# Patient Record
Sex: Male | Born: 1960
Health system: Southern US, Community
[De-identification: ages and names within clinical notes are randomized; demographics above are authoritative.]

## PROBLEM LIST (undated history)

## (undated) DIAGNOSIS — J449 Chronic obstructive pulmonary disease, unspecified: Secondary | ICD-10-CM

## (undated) DIAGNOSIS — E785 Hyperlipidemia, unspecified: Secondary | ICD-10-CM

## (undated) DIAGNOSIS — G629 Polyneuropathy, unspecified: Secondary | ICD-10-CM

## (undated) DIAGNOSIS — I10 Essential (primary) hypertension: Secondary | ICD-10-CM

## (undated) HISTORY — PX: TONSILLECTOMY: SUR1361

## (undated) HISTORY — PX: BACK SURGERY: SHX140

## (undated) HISTORY — DX: Chronic obstructive pulmonary disease, unspecified: J44.9

## (undated) HISTORY — DX: Essential (primary) hypertension: I10

## (undated) HISTORY — DX: Polyneuropathy, unspecified: G62.9

## (undated) HISTORY — PX: HERNIA REPAIR: SHX51

## (undated) HISTORY — DX: Hyperlipidemia, unspecified: E78.5

## (undated) HISTORY — PX: NECK SURGERY: SHX720

---

## 2012-11-26 ENCOUNTER — Encounter: Payer: Self-pay | Admitting: Nurse Practitioner

## 2012-11-26 ENCOUNTER — Ambulatory Visit (INDEPENDENT_AMBULATORY_CARE_PROVIDER_SITE_OTHER): Payer: Medicare Other | Admitting: Nurse Practitioner

## 2012-11-26 VITALS — BP 137/90 | HR 64 | Temp 97.2°F | Ht 73.0 in | Wt 306.5 lb

## 2012-11-26 DIAGNOSIS — E785 Hyperlipidemia, unspecified: Secondary | ICD-10-CM

## 2012-11-26 DIAGNOSIS — I10 Essential (primary) hypertension: Secondary | ICD-10-CM

## 2012-11-26 DIAGNOSIS — J449 Chronic obstructive pulmonary disease, unspecified: Secondary | ICD-10-CM | POA: Insufficient documentation

## 2012-11-26 DIAGNOSIS — J441 Chronic obstructive pulmonary disease with (acute) exacerbation: Secondary | ICD-10-CM

## 2012-11-26 DIAGNOSIS — E1169 Type 2 diabetes mellitus with other specified complication: Secondary | ICD-10-CM | POA: Insufficient documentation

## 2012-11-26 DIAGNOSIS — E782 Mixed hyperlipidemia: Secondary | ICD-10-CM | POA: Insufficient documentation

## 2012-11-26 HISTORY — DX: Hyperlipidemia, unspecified: E78.5

## 2012-11-26 LAB — COMPLETE METABOLIC PANEL WITH GFR
ALT: 28 U/L (ref 0–53)
AST: 22 U/L (ref 0–37)
CO2: 26 mEq/L (ref 19–32)
Calcium: 9.5 mg/dL (ref 8.4–10.5)
Chloride: 104 mEq/L (ref 96–112)
GFR, Est African American: 85 mL/min
Sodium: 142 mEq/L (ref 135–145)
Total Bilirubin: 0.4 mg/dL (ref 0.3–1.2)
Total Protein: 6.5 g/dL (ref 6.0–8.3)

## 2012-11-26 MED ORDER — ACLIDINIUM BROMIDE 400 MCG/ACT IN AEPB: 1.0000 | INHALATION_SPRAY | Freq: Every day | RESPIRATORY_TRACT | Status: DC

## 2012-11-26 MED ORDER — BUDESONIDE-FORMOTEROL FUMARATE 160-4.5 MCG/ACT IN AERO: 2.0000 | INHALATION_SPRAY | Freq: Two times a day (BID) | RESPIRATORY_TRACT | Status: DC

## 2012-11-26 NOTE — Patient Instructions (Signed)

## 2012-11-26 NOTE — Progress Notes (Signed)
  Subjective:    Patient ID: Kurt Blevins, male    DOB: 08-11-1960, 52 y.o.   MRN: [id number]  Hypertension This is a chronic problem. The current episode started more than 1 year ago. The problem is unchanged. The problem is controlled. Associated symptoms include shortness of breath. Pertinent negatives include no blurred vision, chest pain, headaches, neck pain, palpitations or peripheral edema. There are no associated agents to hypertension. Risk factors for coronary artery disease include dyslipidemia, male gender, obesity and sedentary lifestyle. Past treatments include ACE inhibitors and diuretics. The current treatment provides moderate improvement. Compliance problems include diet and exercise.   Hyperlipidemia This is a chronic problem. The current episode started more than 1 year ago. The problem is controlled. Recent lipid tests were reviewed and are normal. Exacerbating diseases include obesity. There are no known factors aggravating his hyperlipidemia. Associated symptoms include shortness of breath. Pertinent negatives include no chest pain, focal sensory loss, focal weakness, leg pain or myalgias. Current antihyperlipidemic treatment includes statins and fibric acid derivatives. The current treatment provides moderate improvement of lipids. Compliance problems include adherence to diet and adherence to exercise.  Risk factors for coronary artery disease include hypertension, male sex and obesity.  COPD Patient doing [place] and albuterol neb- Patient has not been using spirivia or symbicort as RX. Patient says that albuterol helps the most.    Review of Systems  HENT: Negative for neck pain.   Eyes: Negative for blurred vision.  Respiratory: Positive for cough, shortness of breath and wheezing.   Cardiovascular: Negative for chest pain and palpitations.  Musculoskeletal: Negative for myalgias.  Neurological: Negative for focal weakness and headaches.       Objective:   Physical Exam  Constitutional: He appears well-developed and well-nourished.  HENT:  Head: Normocephalic.  Right Ear: External ear normal.  Left Ear: External ear normal.  Nose: Nose normal.  Mouth/Throat: Oropharynx is clear and moist.  Eyes: Conjunctivae and EOM are normal. Pupils are equal, round, and reactive to light.  Neck: Normal range of motion. Neck supple.  Lymphadenopathy:    He has no cervical adenopathy.    BP 137/90  Pulse 64  Temp(Src) 97.2 F (36.2 C) (Oral)  Ht 6\' 1"  (1.854 m)  Wt 306 lb 8 oz (139.027 kg)  BMI 40.45 kg/m2       Assessment & Plan:  1. Hypertension Low Na+ diet Continue  - COMPLETE METABOLIC PANEL WITH GFR  2. Hyperlipidemia Low fat diet and exercise Continue lipitor and fenofibrate - NMR Lipoprofile with Lipids  3. COPD exacerbation Use inhalers as RX Avoid allergens - budesonide-formoterol (SYMBICORT) 160-4.5 MCG/ACT inhaler; Inhale 2 puffs into the lungs 2 (two) times daily.  Dispense: 2 Inhaler; Refill: 0 - Aclidinium Bromide 400 MCG/ACT AEPB; Inhale 1 Inhaler into the lungs daily.  Dispense: 2 each; Refill: 0  Mary-Margaret Daphine Deutscher, FNP

## 2012-11-28 LAB — NMR LIPOPROFILE WITH LIPIDS
Cholesterol, Total: 153 mg/dL (ref <200)
HDL Particle Number: 33.4 umol/L (ref >=30.5)
HDL-C: 37 mg/dL — ABNORMAL LOW (ref >=40)
LDL Size: 20 nm — ABNORMAL LOW (ref >20.5)
LP-IR Score: 85 — ABNORMAL HIGH (ref <=45)
Large HDL-P: <1.3 umol/L — ABNORMAL LOW (ref >=4.8)

## 2013-02-13 ENCOUNTER — Other Ambulatory Visit: Payer: Self-pay | Admitting: *Deleted

## 2013-02-13 MED ORDER — FENOFIBRATE 145 MG PO TABS: 145.0000 mg | ORAL_TABLET | Freq: Every day | ORAL | Status: DC

## 2013-03-04 ENCOUNTER — Telehealth: Payer: Self-pay | Admitting: Nurse Practitioner

## 2013-03-04 NOTE — Telephone Encounter (Signed)
appt made

## 2013-03-05 ENCOUNTER — Ambulatory Visit (INDEPENDENT_AMBULATORY_CARE_PROVIDER_SITE_OTHER): Payer: Medicare Other | Admitting: Nurse Practitioner

## 2013-03-05 ENCOUNTER — Encounter: Payer: Self-pay | Admitting: Nurse Practitioner

## 2013-03-05 VITALS — BP 141/83 | HR 72 | Temp 97.5°F | Ht 73.0 in | Wt 307.0 lb

## 2013-03-05 DIAGNOSIS — L03119 Cellulitis of unspecified part of limb: Secondary | ICD-10-CM

## 2013-03-05 DIAGNOSIS — H9190 Unspecified hearing loss, unspecified ear: Secondary | ICD-10-CM

## 2013-03-05 DIAGNOSIS — IMO0002 Reserved for concepts with insufficient information to code with codable children: Secondary | ICD-10-CM

## 2013-03-05 DIAGNOSIS — H6690 Otitis media, unspecified, unspecified ear: Secondary | ICD-10-CM

## 2013-03-05 MED ORDER — CIPROFLOXACIN HCL 500 MG PO TABS: 500.0000 mg | ORAL_TABLET | Freq: Two times a day (BID) | ORAL | Status: DC

## 2013-03-05 MED ORDER — CEFTRIAXONE SODIUM 1 G IJ SOLR
1.0000 g | INTRAMUSCULAR | Status: AC
  Administered 2013-03-05: 1 g via INTRAMUSCULAR

## 2013-03-05 NOTE — Progress Notes (Signed)
  Subjective:    Patient ID: Kurt Blevins, male    DOB: [birth year], 52 y.o.   MRN: [id number]  HPI 1. Patient woke up Tuesday morning with right elbow swollen and sore to touch. Denies any injury.  2. Also C/o bil ears stopped up- has been using debrox 1-2 x per week- Hearing a little better.    Review of Systems  All other systems reviewed and are negative.       Objective:   Physical Exam  Constitutional: He appears well-developed and well-nourished.  HENT:  Right Ear: Tympanic membrane is erythematous. A middle ear effusion is present.  Left Ear: Tympanic membrane is erythematous. A middle ear effusion is present.  Nose: Mucosal edema present. Right sinus exhibits no maxillary sinus tenderness and no frontal sinus tenderness. Left sinus exhibits no maxillary sinus tenderness and no frontal sinus tenderness.  Mouth/Throat: Oropharynx is clear and moist and mucous membranes are normal.  Eyes: EOM are normal. Pupils are equal, round, and reactive to light.  Neck: Normal range of motion. Neck supple.  Cardiovascular: Normal rate and normal heart sounds.   No murmur heard. Pulmonary/Chest: Effort normal and breath sounds normal.  Musculoskeletal:  Right elbow edematous and hot to touch. Erythema extends 5cm upward and 10 cm downward  Lymphadenopathy:    He has no cervical adenopathy.    BP 141/83  Pulse 72  Temp(Src) 97.5 F (36.4 C) (Oral)  Ht 6\' 1"  (1.854 m)  Wt 307 lb (139.254 kg)  BMI 40.51 kg/m2       Assessment & Plan:  1. Cellulitis of elbow Ice if helps Marks made at edge of reddness- if extends past yhose marks tomorrow RTO for another shot - cefTRIAXone (ROCEPHIN) injection 1 g; Inject 1 g into the muscle now. - ciprofloxacin (CIPRO) 500 MG tablet; Take 1 tablet (500 mg total) by mouth 2 (two) times daily.  Dispense: 20 tablet; Refill: 0  2. OM (otitis media), bilateral  - ciprofloxacin (CIPRO) 500 MG tablet; Take 1 tablet (500 mg total) by mouth 2 (two)  times daily.  Dispense: 20 tablet; Refill: 0  3. Hearing loss, unspecified laterality Referral for hearing test  Mary-Margaret Daphine Deutscher, FNP

## 2013-03-05 NOTE — Patient Instructions (Signed)
Cellulitis Cellulitis is an infection of the skin and the tissue beneath it. The infected area is usually red and tender. Cellulitis occurs most often in the arms and lower legs.  CAUSES  Cellulitis is caused by bacteria that enter the skin through cracks or cuts in the skin. The most common types of bacteria that cause cellulitis are Staphylococcus and Streptococcus. SYMPTOMS   Redness and warmth.  Swelling.  Tenderness or pain.  Fever. DIAGNOSIS  Your caregiver can usually determine what is wrong based on a physical exam. Blood tests may also be done. TREATMENT  Treatment usually involves taking an antibiotic medicine. HOME CARE INSTRUCTIONS   Take your antibiotics as directed. Finish them even if you start to feel better.  Keep the infected arm or leg elevated to reduce swelling.  Apply a warm cloth to the affected area up to 4 times per day to relieve pain.  Only take over-the-counter or prescription medicines for pain, discomfort, or fever as directed by your caregiver.  Keep all follow-up appointments as directed by your caregiver. SEEK MEDICAL CARE IF:   You notice red streaks coming from the infected area.  Your red area gets larger or turns dark in color.  Your bone or joint underneath the infected area becomes painful after the skin has healed.  Your infection returns in the same area or another area.  You notice a swollen bump in the infected area.  You develop new symptoms. SEEK IMMEDIATE MEDICAL CARE IF:   You have a fever.  You feel very sleepy.  You develop vomiting or diarrhea.  You have a general ill feeling (malaise) with muscle aches and pains. MAKE SURE YOU:   Understand these instructions.  Will watch your condition.  Will get help right away if you are not doing well or get worse. Document Released: 04/18/2005 Document Revised: 01/08/2012 Document Reviewed: 09/24/2011 Exit[employer] Patient Information 2014 [employer].  

## 2013-03-30 ENCOUNTER — Ambulatory Visit (INDEPENDENT_AMBULATORY_CARE_PROVIDER_SITE_OTHER): Payer: Medicare Other | Admitting: Nurse Practitioner

## 2013-03-30 ENCOUNTER — Encounter: Payer: Self-pay | Admitting: Nurse Practitioner

## 2013-03-30 VITALS — BP 133/87 | HR 69 | Temp 97.3°F | Ht 73.0 in | Wt 304.0 lb

## 2013-03-30 DIAGNOSIS — L03119 Cellulitis of unspecified part of limb: Secondary | ICD-10-CM

## 2013-03-30 DIAGNOSIS — IMO0002 Reserved for concepts with insufficient information to code with codable children: Secondary | ICD-10-CM

## 2013-03-30 MED ORDER — SULFAMETHOXAZOLE-TMP DS 800-160 MG PO TABS: 1.0000 | ORAL_TABLET | Freq: Two times a day (BID) | ORAL | Status: DC

## 2013-03-30 MED ORDER — CEFTRIAXONE SODIUM 1 G IJ SOLR
1.0000 g | Freq: Once | INTRAMUSCULAR | Status: AC
  Administered 2013-03-30: 1 g via INTRAMUSCULAR

## 2013-03-30 NOTE — Progress Notes (Signed)
  Subjective:    Patient ID: ISSAIH KAUS, male    DOB: June 28, 1961, 52 y.o.   MRN: [id number]  HPI Patient in c/o right elbow pain- red and swollen- was given rocephin and oral antibiotic about 3 weeks ago- got some better but has never completely resolved.    Review of Systems  All other systems reviewed and are negative.       Objective:   Physical Exam  Musculoskeletal:  Right elbow erythematous and hot to touch.   Procedure: Lidocaine 1% with epi 1 cc local Cleaned with betadine #11 blade- Serosanguinous fluid removed from elbow Pressure dressing applied       Assessment & Plan:  1. Cellulitis of elbow Keep clean and dry RTO if not improving Culture pending - cefTRIAXone (ROCEPHIN) injection 1 g; Inject 1 g into the muscle once. - sulfamethoxazole-trimethoprim (BACTRIM DS) 800-160 MG per tablet; Take 1 tablet by mouth 2 (two) times daily.  Dispense: 20 tablet; Refill: 0  Mary-Margaret Daphine Deutscher, FNP

## 2013-03-30 NOTE — Patient Instructions (Addendum)
Cellulitis Cellulitis is an infection of the skin and the tissue beneath it. The infected area is usually red and tender. Cellulitis occurs most often in the arms and lower legs.  CAUSES  Cellulitis is caused by bacteria that enter the skin through cracks or cuts in the skin. The most common types of bacteria that cause cellulitis are Staphylococcus and Streptococcus. SYMPTOMS   Redness and warmth.  Swelling.  Tenderness or pain.  Fever. DIAGNOSIS  Your caregiver can usually determine what is wrong based on a physical exam. Blood tests may also be done. TREATMENT  Treatment usually involves taking an antibiotic medicine. HOME CARE INSTRUCTIONS   Take your antibiotics as directed. Finish them even if you start to feel better.  Keep the infected arm or leg elevated to reduce swelling.  Apply a warm cloth to the affected area up to 4 times per day to relieve pain.  Only take over-the-counter or prescription medicines for pain, discomfort, or fever as directed by your caregiver.  Keep all follow-up appointments as directed by your caregiver. SEEK MEDICAL CARE IF:   You notice red streaks coming from the infected area.  Your red area gets larger or turns dark in color.  Your bone or joint underneath the infected area becomes painful after the skin has healed.  Your infection returns in the same area or another area.  You notice a swollen bump in the infected area.  You develop new symptoms. SEEK IMMEDIATE MEDICAL CARE IF:   You have a fever.  You feel very sleepy.  You develop vomiting or diarrhea.  You have a general ill feeling (malaise) with muscle aches and pains. MAKE SURE YOU:   Understand these instructions.  Will watch your condition.  Will get help right away if you are not doing well or get worse. Document Released: 04/18/2005 Document Revised: 01/08/2012 Document Reviewed: 09/24/2011 [employer] Patient Information 2014 [employer].  

## 2013-04-15 ENCOUNTER — Other Ambulatory Visit: Payer: Self-pay | Admitting: *Deleted

## 2013-04-15 MED ORDER — LISINOPRIL-HYDROCHLOROTHIAZIDE 20-25 MG PO TABS: 1.0000 | ORAL_TABLET | Freq: Every day | ORAL | Status: DC

## 2013-06-10 ENCOUNTER — Ambulatory Visit (INDEPENDENT_AMBULATORY_CARE_PROVIDER_SITE_OTHER): Payer: Medicare Other | Admitting: Nurse Practitioner

## 2013-06-10 VITALS — BP 146/84 | HR 79 | Temp 97.1°F | Ht 73.0 in | Wt 298.0 lb

## 2013-06-10 DIAGNOSIS — J441 Chronic obstructive pulmonary disease with (acute) exacerbation: Secondary | ICD-10-CM

## 2013-06-10 MED ORDER — BUDESONIDE-FORMOTEROL FUMARATE 160-4.5 MCG/ACT IN AERO: 2.0000 | INHALATION_SPRAY | Freq: Two times a day (BID) | RESPIRATORY_TRACT | Status: DC

## 2013-06-10 MED ORDER — LEVALBUTEROL HCL 1.25 MG/0.5ML IN NEBU
1.2500 mg | INHALATION_SOLUTION | Freq: Once | RESPIRATORY_TRACT | Status: AC
  Administered 2013-06-10: 1.25 mg via RESPIRATORY_TRACT

## 2013-06-10 MED ORDER — TIOTROPIUM BROMIDE MONOHYDRATE 18 MCG IN CAPS: 1.0000 | ORAL_CAPSULE | Freq: Every day | RESPIRATORY_TRACT | Status: DC

## 2013-06-10 MED ORDER — ATORVASTATIN CALCIUM 40 MG PO TABS: 40.0000 mg | ORAL_TABLET | Freq: Every day | ORAL | Status: DC

## 2013-06-10 MED ORDER — ACLIDINIUM BROMIDE 400 MCG/ACT IN AEPB: 1.0000 | INHALATION_SPRAY | Freq: Every day | RESPIRATORY_TRACT | Status: DC

## 2013-06-10 MED ORDER — FENOFIBRATE 145 MG PO TABS: 145.0000 mg | ORAL_TABLET | Freq: Every day | ORAL | Status: DC

## 2013-06-10 NOTE — Progress Notes (Signed)
  Subjective:    Patient ID: Kurt Blevins, male    DOB: 1960-07-30, 52 y.o.   MRN: [id number]  Otalgia  There is pain in the right ear. This is a new problem. The current episode started in the past 7 days. The problem occurs constantly. The problem has been unchanged. There has been no fever. The pain is at a severity of 6/10. The pain is moderate. Associated symptoms include coughing and headaches. Pertinent negatives include no neck pain. He has tried nothing for the symptoms.  Hypertension This is a chronic problem. The current episode started more than 1 year ago. The problem is unchanged. The problem is controlled. Associated symptoms include headaches and shortness of breath. Pertinent negatives include no blurred vision, chest pain, neck pain, palpitations or peripheral edema. There are no associated agents to hypertension. Risk factors for coronary artery disease include dyslipidemia, male gender, obesity and sedentary lifestyle. Past treatments include ACE inhibitors and diuretics. The current treatment provides moderate improvement. Compliance problems include diet and exercise.   Hyperlipidemia This is a chronic problem. The current episode started more than 1 year ago. The problem is controlled. Recent lipid tests were reviewed and are normal. Exacerbating diseases include obesity. There are no known factors aggravating his hyperlipidemia. Associated symptoms include shortness of breath. Pertinent negatives include no chest pain, focal sensory loss, focal weakness, leg pain or myalgias. Current antihyperlipidemic treatment includes statins and fibric acid derivatives. The current treatment provides moderate improvement of lipids. Compliance problems include adherence to diet and adherence to exercise.  Risk factors for coronary artery disease include hypertension, male sex and obesity.  COPD Patient doing [place] and albuterol neb-Patient ran out of symbicort and spirivia and is wheezing and  SOB today- can't always afford his meds.   Review of Systems  HENT: Positive for ear pain.   Eyes: Negative for blurred vision.  Respiratory: Positive for cough, shortness of breath and wheezing.   Cardiovascular: Negative for chest pain and palpitations.  Musculoskeletal: Negative for myalgias and neck pain.  Neurological: Positive for headaches. Negative for focal weakness.       Objective:   Physical Exam  Constitutional: He appears well-developed and well-nourished.  HENT:  Head: Normocephalic.  Right Ear: External ear normal.  Left Ear: External ear normal.  Nose: Nose normal.  Mouth/Throat: Oropharynx is clear and moist.  Eyes: Conjunctivae and EOM are normal. Pupils are equal, round, and reactive to light.  Neck: Normal range of motion. Neck supple.  Lymphadenopathy:    He has no cervical adenopathy.    BP 146/84  Pulse 79  Temp(Src) 97.1 F (36.2 C) (Oral)  Ht 6\' 1"  (1.854 m)  Wt 298 lb (135.172 kg)  BMI 39.32 kg/m2       Assessment & Plan:  1. Hypertension Low Na+ diet Continue  - COMPLETE METABOLIC PANEL WITH GFR  2. Hyperlipidemia Low fat diet and exercise Continue lipitor and fenofibrate - NMR Lipoprofile with Lipids  3. COPD exacerbation Use inhalers as RX Avoid allergens - budesonide-formoterol (SYMBICORT) 160-4.5 MCG/ACT inhaler; Inhale 2 puffs into the lungs 2 (two) times daily.  Dispense: 2 Inhaler; Refill: 0 - Aclidinium Bromide 400 MCG/ACT AEPB; Inhale 1 Inhaler into the lungs daily.  Dispense: 2 each; Refill: 0  Mary-Margaret Daphine Deutscher, FNP

## 2013-06-11 ENCOUNTER — Encounter: Payer: Self-pay | Admitting: Nurse Practitioner

## 2013-06-11 ENCOUNTER — Telehealth: Payer: Self-pay | Admitting: Nurse Practitioner

## 2013-06-11 MED ORDER — ALBUTEROL SULFATE HFA 108 (90 BASE) MCG/ACT IN AERS: 2.0000 | INHALATION_SPRAY | Freq: Four times a day (QID) | RESPIRATORY_TRACT | Status: DC | PRN

## 2013-06-11 NOTE — Telephone Encounter (Signed)
Albuterol rx sent to pharmacy 

## 2013-06-12 ENCOUNTER — Other Ambulatory Visit: Payer: Self-pay | Admitting: Nurse Practitioner

## 2013-06-12 LAB — NMR, LIPOPROFILE
Cholesterol: 182 mg/dL (ref <200)
HDL Cholesterol by NMR: 43 mg/dL (ref >=40)
LDL Size: 20.2 nm — ABNORMAL LOW (ref >20.5)
LDLC SERPL CALC-MCNC: 77 mg/dL (ref <100)
Small LDL Particle Number: 1212 nmol/L — ABNORMAL HIGH (ref <=527)
Triglycerides by NMR: 309 mg/dL — ABNORMAL HIGH (ref <150)

## 2013-06-12 LAB — CMP14+EGFR
ALT: 33 IU/L (ref 0–44)
Albumin/Globulin Ratio: 2.4 (ref 1.1–2.5)
Albumin: 4.8 g/dL (ref 3.5–5.5)
BUN: 17 mg/dL (ref 6–24)
CO2: 26 mmol/L (ref 18–29)
GFR calc Af Amer: 80 mL/min/{1.73_m2} (ref >59)
GFR calc non Af Amer: 70 mL/min/{1.73_m2} (ref >59)
Glucose: 94 mg/dL (ref 65–99)
Total Bilirubin: 0.4 mg/dL (ref 0.0–1.2)
Total Protein: 6.8 g/dL (ref 6.0–8.5)

## 2013-06-12 MED ORDER — EZETIMIBE 10 MG PO TABS: 10.0000 mg | ORAL_TABLET | Freq: Every day | ORAL | Status: DC

## 2013-07-06 ENCOUNTER — Telehealth: Payer: Self-pay | Admitting: *Deleted

## 2013-07-06 NOTE — Telephone Encounter (Signed)
notified of labs

## 2013-07-06 NOTE — Telephone Encounter (Signed)
Message copied by Baltazar Apo on Mon Jul 06, 2013 11:57 AM ------      Message from: Magdalene River      Created: Wed Jul 01, 2013  2:47 PM                   ----- Message -----         From: April Yetta Numbers         Sent: 06/24/2013   4:38 PM           To: [facility] Clinical Pool A             ------

## 2013-07-24 ENCOUNTER — Telehealth: Payer: Self-pay | Admitting: Family Medicine

## 2013-07-27 NOTE — Telephone Encounter (Signed)
No return call from patient.

## 2013-09-22 ENCOUNTER — Other Ambulatory Visit: Payer: Self-pay

## 2013-09-22 MED ORDER — EZETIMIBE 10 MG PO TABS: 10.0000 mg | ORAL_TABLET | Freq: Every day | ORAL | Status: DC

## 2013-09-29 ENCOUNTER — Ambulatory Visit (INDEPENDENT_AMBULATORY_CARE_PROVIDER_SITE_OTHER): Payer: Medicare Other

## 2013-09-29 ENCOUNTER — Ambulatory Visit (INDEPENDENT_AMBULATORY_CARE_PROVIDER_SITE_OTHER): Payer: Medicare Other | Admitting: Nurse Practitioner

## 2013-09-29 ENCOUNTER — Encounter (INDEPENDENT_AMBULATORY_CARE_PROVIDER_SITE_OTHER): Payer: Self-pay

## 2013-09-29 ENCOUNTER — Encounter: Payer: Self-pay | Admitting: Nurse Practitioner

## 2013-09-29 VITALS — BP 147/92 | HR 56 | Temp 96.7°F | Ht 73.0 in | Wt 312.4 lb

## 2013-09-29 DIAGNOSIS — E785 Hyperlipidemia, unspecified: Secondary | ICD-10-CM

## 2013-09-29 DIAGNOSIS — R7989 Other specified abnormal findings of blood chemistry: Secondary | ICD-10-CM

## 2013-09-29 DIAGNOSIS — J441 Chronic obstructive pulmonary disease with (acute) exacerbation: Secondary | ICD-10-CM

## 2013-09-29 DIAGNOSIS — I10 Essential (primary) hypertension: Secondary | ICD-10-CM

## 2013-09-29 DIAGNOSIS — Z125 Encounter for screening for malignant neoplasm of prostate: Secondary | ICD-10-CM

## 2013-09-29 DIAGNOSIS — R7309 Other abnormal glucose: Secondary | ICD-10-CM

## 2013-09-29 MED ORDER — ACLIDINIUM BROMIDE 400 MCG/ACT IN AEPB: 1.0000 | INHALATION_SPRAY | Freq: Every day | RESPIRATORY_TRACT | Status: DC

## 2013-09-29 MED ORDER — TIOTROPIUM BROMIDE MONOHYDRATE 18 MCG IN CAPS: 1.0000 | ORAL_CAPSULE | Freq: Every day | RESPIRATORY_TRACT | Status: DC

## 2013-09-29 MED ORDER — ALBUTEROL SULFATE HFA 108 (90 BASE) MCG/ACT IN AERS: 2.0000 | INHALATION_SPRAY | Freq: Four times a day (QID) | RESPIRATORY_TRACT | Status: DC | PRN

## 2013-09-29 MED ORDER — BUDESONIDE-FORMOTEROL FUMARATE 160-4.5 MCG/ACT IN AERO: 2.0000 | INHALATION_SPRAY | Freq: Two times a day (BID) | RESPIRATORY_TRACT | Status: DC

## 2013-09-29 MED ORDER — FENOFIBRATE 145 MG PO TABS: 145.0000 mg | ORAL_TABLET | Freq: Every day | ORAL | Status: DC

## 2013-09-29 MED ORDER — LISINOPRIL-HYDROCHLOROTHIAZIDE 20-25 MG PO TABS: 1.0000 | ORAL_TABLET | Freq: Every day | ORAL | Status: DC

## 2013-09-29 MED ORDER — ATORVASTATIN CALCIUM 40 MG PO TABS: 40.0000 mg | ORAL_TABLET | Freq: Every day | ORAL | Status: DC

## 2013-09-29 NOTE — Patient Instructions (Signed)

## 2013-09-29 NOTE — Progress Notes (Signed)
Subjective:    Patient ID: Kurt Blevins, male    DOB: July 11, 1961, 53 y.o.   MRN: [id number]  Patient in today for follow up of chronic medical problems. He is doing weel today without complaints.  Hypertension This is a chronic problem. The current episode started more than 1 year ago. The problem is unchanged. The problem is controlled (blood pressure elevated today because patient says his back is hurting.). Associated symptoms include shortness of breath. Pertinent negatives include no blurred vision, chest pain, headaches, neck pain, palpitations or peripheral edema. There are no associated agents to hypertension. Risk factors for coronary artery disease include dyslipidemia, male gender, obesity and sedentary lifestyle. Past treatments include ACE inhibitors and diuretics. The current treatment provides moderate improvement. Compliance problems include diet and exercise.   Hyperlipidemia This is a chronic problem. The current episode started more than 1 year ago. The problem is controlled. Recent lipid tests were reviewed and are normal. Exacerbating diseases include obesity. There are no known factors aggravating his hyperlipidemia. Associated symptoms include shortness of breath. Pertinent negatives include no chest pain, focal sensory loss, focal weakness, leg pain or myalgias. Current antihyperlipidemic treatment includes statins and fibric acid derivatives (patient was on zetia but stopped taking because it gave him a headache.). The current treatment provides moderate improvement of lipids. Compliance problems include adherence to diet and adherence to exercise.  Risk factors for coronary artery disease include hypertension, male sex and obesity.  COPD Patient doing [place] and albuterol neb- Patient has not been using spirivia or symbicort as RX. Patient says that albuterol helps the most.    Review of Systems  Eyes: Negative for blurred vision.  Respiratory: Positive for cough,  shortness of breath and wheezing.   Cardiovascular: Negative for chest pain and palpitations.  Musculoskeletal: Negative for myalgias and neck pain.  Neurological: Negative for focal weakness and headaches.       Objective:   Physical Exam  Constitutional: He is oriented to person, place, and time. He appears well-developed and well-nourished.  HENT:  Head: Normocephalic.  Right Ear: External ear normal.  Left Ear: External ear normal.  Nose: Nose normal.  Mouth/Throat: Oropharynx is clear and moist.  Eyes: Conjunctivae and EOM are normal. Pupils are equal, round, and reactive to light.  Neck: Normal range of motion. Neck supple. No JVD present. No thyromegaly present.  Cardiovascular: Normal rate, regular rhythm, normal heart sounds and intact distal pulses.  Exam reveals no gallop and no friction rub.   No murmur heard. Pulmonary/Chest: Effort normal and breath sounds normal. No respiratory distress. He has no wheezes. He has no rales. He exhibits no tenderness.  Abdominal: Soft. Bowel sounds are normal. He exhibits no mass. There is no tenderness.  Genitourinary: Penis normal.  Refuses prostate exam   Musculoskeletal: Normal range of motion. He exhibits no edema.  Lymphadenopathy:    He has no cervical adenopathy.  Neurological: He is alert and oriented to person, place, and time. No cranial nerve deficit.  Skin: Skin is warm and dry.  Psychiatric: He has a normal mood and affect. His behavior is normal. Judgment and thought content normal.    BP 147/92  Pulse 56  Temp(Src) 96.7 F (35.9 C) (Oral)  Ht _0  (1.854 m)  Wt 312 lb 6.4 oz (141.704 kg)  BMI 41.23 kg/m2  Chest xray- normal-Preliminary reading by Ronnald Collum, FNP  [facility] ekg-NSR Spirometry- uncahnged from previous- FEV! %-64%      Assessment & Plan:  1. Hyperlipidemia   2. COPD exacerbation   3. Hypertension   4. Prostate cancer screening    Orders Placed This Encounter  Procedures  . DG Chest 2  View    Standing Status: Future     Number of Occurrences: 1     Standing Expiration Date: 11/29/2014    Order Specific Question:  Reason for Exam (SYMPTOM  OR DIAGNOSIS REQUIRED)    Answer:  former smoker    Order Specific Question:  Preferred imaging location?    Answer:  Internal  . CMP14+EGFR  . NMR, lipoprofile  . PSA, total and free  . EKG 12-Lead  . PR BREATHING CAPACITY TEST   Meds ordered this encounter  Medications  . Aclidinium Bromide 400 MCG/ACT AEPB    Sig: Inhale 1 Inhaler into the lungs daily.    Dispense:  2 each    Refill:  5    Order Specific Question:  Supervising Provider    Answer:  Chipper Herb [[id number]]  . albuterol (PROVENTIL HFA;VENTOLIN HFA) 108 (90 BASE) MCG/ACT inhaler    Sig: Inhale 2 puffs into the lungs every 6 (six) hours as needed for wheezing or shortness of breath.    Dispense:  1 Inhaler    Refill:  1    Order Specific Question:  Supervising Provider    Answer:  Chipper Herb [[id number]]  . atorvastatin (LIPITOR) 40 MG tablet    Sig: Take 1 tablet (40 mg total) by mouth daily.    Dispense:  30 tablet    Refill:  5    Order Specific Question:  Supervising Provider    Answer:  Chipper Herb [[id number]]  . budesonide-formoterol (SYMBICORT) 160-4.5 MCG/ACT inhaler    Sig: Inhale 2 puffs into the lungs 2 (two) times daily.    Dispense:  2 Inhaler    Refill:  5    Order Specific Question:  Supervising Provider    Answer:  Chipper Herb [[id number]]  . fenofibrate (TRICOR) 145 MG tablet    Sig: Take 1 tablet (145 mg total) by mouth daily.    Dispense:  30 tablet    Refill:  5    Order Specific Question:  Supervising Provider    Answer:  Chipper Herb [[id number]]  . lisinopril-hydrochlorothiazide (PRINZIDE,ZESTORETIC) 20-25 MG per tablet    Sig: Take 1 tablet by mouth daily.    Dispense:  30 tablet    Refill:  5    Order Specific Question:  Supervising Provider    Answer:  Chipper Herb [[id number]]  . tiotropium (SPIRIVA HANDIHALER) 18 MCG inhalation  capsule    Sig: Place 1 capsule (18 mcg total) into inhaler and inhale daily.    Dispense:  30 capsule    Refill:  5    Order Specific Question:  Supervising Provider    Answer:  Chipper Herb [place] pending Health maintenance reviewed Diet and exercise encouraged Continue all meds Follow up  In 3 months   [place], FNP

## 2013-10-01 LAB — NMR, LIPOPROFILE
Cholesterol: 166 mg/dL (ref <200)
HDL Cholesterol by NMR: 36 mg/dL — ABNORMAL LOW (ref >=40)
HDL Particle Number: 31.7 umol/L (ref >=30.5)
LDL Particle Number: 1339 nmol/L — ABNORMAL HIGH (ref <1000)
LDL SIZE: 19.5 nm — AB (ref >20.5)
LDLC SERPL CALC-MCNC: 65 mg/dL (ref <100)
LP-IR SCORE: 91 — AB (ref <=45)
Small LDL Particle Number: 1169 nmol/L — ABNORMAL HIGH (ref <=527)
Triglycerides by NMR: 323 mg/dL — ABNORMAL HIGH (ref <150)

## 2013-10-01 LAB — CMP14+EGFR
ALT: 35 IU/L (ref 0–44)
AST: 28 IU/L (ref 0–40)
Albumin/Globulin Ratio: 2.3 (ref 1.1–2.5)
Albumin: 4.5 g/dL (ref 3.5–5.5)
Alkaline Phosphatase: 67 IU/L (ref 39–117)
BILIRUBIN TOTAL: 0.3 mg/dL (ref 0.0–1.2)
BUN/Creatinine Ratio: 17 (ref 9–20)
BUN: 19 mg/dL (ref 6–24)
CHLORIDE: 99 mmol/L (ref 97–108)
CO2: 23 mmol/L (ref 18–29)
Calcium: 9.7 mg/dL (ref 8.7–10.2)
Creatinine, Ser: 1.1 mg/dL (ref 0.76–1.27)
GFR calc non Af Amer: 77 mL/min/{1.73_m2} (ref >59)
GFR, EST AFRICAN AMERICAN: 89 mL/min/{1.73_m2} (ref >59)
Globulin, Total: 2 g/dL (ref 1.5–4.5)
Glucose: 121 mg/dL — ABNORMAL HIGH (ref 65–99)
POTASSIUM: 4.4 mmol/L (ref 3.5–5.2)
Sodium: 138 mmol/L (ref 134–144)
Total Protein: 6.5 g/dL (ref 6.0–8.5)

## 2013-10-01 LAB — PSA, TOTAL AND FREE
PSA, Free Pct: 32.2 %
PSA, Free: 0.29 ng/mL
PSA: 0.9 ng/mL (ref 0.0–4.0)

## 2013-10-19 LAB — POCT GLYCOSYLATED HEMOGLOBIN (HGB A1C): Hemoglobin A1C: 6.3

## 2013-10-19 NOTE — Addendum Note (Signed)
Addended by: Pollyann Kennedy F on: 10/19/2013 05:09 PM   Modules accepted: Orders

## 2014-02-01 ENCOUNTER — Ambulatory Visit (INDEPENDENT_AMBULATORY_CARE_PROVIDER_SITE_OTHER): Payer: Medicare Other | Admitting: Family Medicine

## 2014-02-01 DIAGNOSIS — T148XXA Other injury of unspecified body region, initial encounter: Secondary | ICD-10-CM

## 2014-02-01 DIAGNOSIS — IMO0002 Reserved for concepts with insufficient information to code with codable children: Secondary | ICD-10-CM

## 2014-02-01 DIAGNOSIS — Z23 Encounter for immunization: Secondary | ICD-10-CM

## 2014-02-01 MED ORDER — HYDROCODONE-ACETAMINOPHEN 5-325 MG PO TABS: 1.0000 | ORAL_TABLET | Freq: Four times a day (QID) | ORAL | Status: DC | PRN

## 2014-02-01 MED ORDER — DOXYCYCLINE HYCLATE 100 MG PO TABS: 100.0000 mg | ORAL_TABLET | Freq: Two times a day (BID) | ORAL | Status: DC

## 2014-02-01 NOTE — Addendum Note (Signed)
Addended by: Marin Olp on: 02/01/2014 07:42 PM   Modules accepted: Orders

## 2014-02-01 NOTE — Progress Notes (Signed)
   Subjective:    Patient ID: Kurt Blevins, male    DOB: 1960/12/31, 53 y.o.   MRN: [id number]  HPI C/lo left index finger laceration due to accident while using his knife to whittle.   Review of Systems C/o laceration   No chest pain, SOB, HA, dizziness, vision change, N/V, diarrhea, constipation, dysuria, urinary urgency or frequency, myalgias, arthralgias or rash.  Objective:   Physical Exam  3 cm laceration across the upper part of his left index finger.  Tendons intact and patient with good sensation to touch in fingertip.  The finger is anesthetized with 5 cc's of marcaine injected on each side of the finger for nerve block.  Once adequate anesthesia is acquired four #4.0 nylon sutures are  Used to approximate and then four #5.0 nylon sutures are used to approximate laceration edges. The laceration was irrigated prior with betadine and sterile normal saline.  Patient tolerated well.         Assessment & Plan:  Laceration - Plan: doxycycline (VIBRA-TABS) 100 MG tablet, HYDROcodone-acetaminophen (NORCO) 5-325 MG per tablet  Follow up in 10 days for suture removal  Lysbeth Penner FNP

## 2014-02-11 ENCOUNTER — Ambulatory Visit (INDEPENDENT_AMBULATORY_CARE_PROVIDER_SITE_OTHER): Payer: Medicare Other | Admitting: Family Medicine

## 2014-02-11 VITALS — BP 141/85 | HR 68 | Temp 97.3°F | Ht 73.0 in | Wt 305.8 lb

## 2014-02-11 DIAGNOSIS — Z4802 Encounter for removal of sutures: Secondary | ICD-10-CM

## 2014-02-11 NOTE — Progress Notes (Signed)
   Subjective:    Patient ID: Kurt Blevins, male    DOB: Nov 23, 1960, 53 y.o.   MRN: [id number]  HPI  Suture removal left index finger.  Review of Systems    No chest pain, SOB, HA, dizziness, vision change, N/V, diarrhea, constipation, dysuria, urinary urgency or frequency, myalgias, arthralgias or rash.  Objective:   Physical Exam Left finger with laceration well approx with 8 nylon sutures.  Sutures removed and the edges to laceration are well approximated.       Assessment & Plan:  Suture removal - Follow up prn Lysbeth Penner FNP

## 2014-04-28 ENCOUNTER — Encounter: Payer: Self-pay | Admitting: Nurse Practitioner

## 2014-04-28 ENCOUNTER — Ambulatory Visit (INDEPENDENT_AMBULATORY_CARE_PROVIDER_SITE_OTHER): Payer: Medicare Other | Admitting: Nurse Practitioner

## 2014-04-28 VITALS — BP 131/89 | HR 78 | Temp 97.0°F | Ht 73.0 in | Wt 299.0 lb

## 2014-04-28 DIAGNOSIS — I10 Essential (primary) hypertension: Secondary | ICD-10-CM

## 2014-04-28 DIAGNOSIS — J449 Chronic obstructive pulmonary disease, unspecified: Secondary | ICD-10-CM

## 2014-04-28 DIAGNOSIS — E785 Hyperlipidemia, unspecified: Secondary | ICD-10-CM

## 2014-04-28 MED ORDER — LISINOPRIL-HYDROCHLOROTHIAZIDE 20-25 MG PO TABS: 1.0000 | ORAL_TABLET | Freq: Every day | ORAL | Status: DC

## 2014-04-28 MED ORDER — METHYLPREDNISOLONE ACETATE 80 MG/ML IJ SUSP
80.0000 mg | Freq: Once | INTRAMUSCULAR | Status: AC
Start: 2014-04-28 — End: 2014-04-28
  Administered 2014-04-28: 80 mg via INTRAMUSCULAR

## 2014-04-28 MED ORDER — ATORVASTATIN CALCIUM 40 MG PO TABS: 40.0000 mg | ORAL_TABLET | Freq: Every day | ORAL | Status: DC

## 2014-04-28 MED ORDER — FENOFIBRATE 145 MG PO TABS: 145.0000 mg | ORAL_TABLET | Freq: Every day | ORAL | Status: DC

## 2014-04-28 MED ORDER — IPRATROPIUM-ALBUTEROL 0.5-2.5 (3) MG/3ML IN SOLN: 3.0000 mL | Freq: Three times a day (TID) | RESPIRATORY_TRACT | Status: DC | PRN

## 2014-04-28 NOTE — Progress Notes (Signed)
Subjective:    Patient ID: Kurt Blevins, male    DOB: 1961-01-26, 53 y.o.   MRN: [id number]  Patient in today for follow up of chronic medical problems. He is doing weel today without complaints.  Hypertension This is a chronic problem. The current episode started more than 1 year ago. The problem is unchanged. The problem is controlled (blood pressure elevated today because patient says his back is hurting.). Associated symptoms include shortness of breath. Pertinent negatives include no blurred vision, neck pain, palpitations or peripheral edema. There are no associated agents to hypertension. Risk factors for coronary artery disease include dyslipidemia, male gender, obesity and sedentary lifestyle. Past treatments include ACE inhibitors and diuretics. The current treatment provides moderate improvement. Compliance problems include diet and exercise.   Hyperlipidemia This is a chronic problem. The current episode started more than 1 year ago. The problem is controlled. Recent lipid tests were reviewed and are normal. Exacerbating diseases include obesity. There are no known factors aggravating his hyperlipidemia. Associated symptoms include shortness of breath. Pertinent negatives include no focal sensory loss, focal weakness or myalgias. Current antihyperlipidemic treatment includes statins and fibric acid derivatives (patient was on zetia but stopped taking because it gave him a headache.). The current treatment provides moderate improvement of lipids. Compliance problems include adherence to diet and adherence to exercise.  Risk factors for coronary artery disease include hypertension, male sex and obesity.  COPD Patient doing [place] and albuterol neb- Patient has not been using spirivia or symbicort as RX. Patient says that albuterol helps the most. Spirometry was done at last visit which was FEV1 75%    Review of Systems  Eyes: Negative for blurred vision.  Respiratory: Positive for  cough, shortness of breath and wheezing.   Cardiovascular: Negative for palpitations.  Musculoskeletal: Negative for myalgias and neck pain.  Neurological: Negative for focal weakness.       Objective:   Physical Exam  Constitutional: He is oriented to person, place, and time. He appears well-developed and well-nourished. No distress.  HENT:  Head: Normocephalic.  Right Ear: External ear normal.  Left Ear: External ear normal.  Nose: Nose normal.  Mouth/Throat: Oropharynx is clear and moist.  Eyes: Conjunctivae and EOM are normal. Pupils are equal, round, and reactive to light.  Neck: Normal range of motion. Neck supple. No JVD present. No thyromegaly present.  Cardiovascular: Normal rate, regular rhythm, normal heart sounds and intact distal pulses.  Exam reveals no gallop and no friction rub.   No murmur heard. Pulmonary/Chest: Effort normal. No respiratory distress. He has wheezes (insp and exp throughout). He has no rales. He exhibits no tenderness.  Abdominal: Soft. Bowel sounds are normal. He exhibits no mass. There is no tenderness.  Genitourinary: Penis normal.  Refuses prostate exam   Musculoskeletal: Normal range of motion. He exhibits no edema.  Lymphadenopathy:    He has no cervical adenopathy.  Neurological: He is alert and oriented to person, place, and time. No cranial nerve deficit.  Skin: Skin is warm and dry.  Psychiatric: He has a normal mood and affect. His behavior is normal. Judgment and thought content normal.    BP 131/89  Pulse 78  Temp(Src) 97 F (36.1 C) (Oral)  Ht _0  (1.854 m)  Wt 299 lb (135.626 kg)  BMI 39.46 kg/m2     Assessment & Plan:    1. Essential hypertension Low Na+ diet - lisinopril-hydrochlorothiazide (PRINZIDE,ZESTORETIC) 20-25 MG per tablet; Take 1 tablet by mouth daily.  Dispense: 30 tablet; Refill: 5 - CMP14+EGFR  2. Hyperlipidemia Low fat diet - fenofibrate (TRICOR) 145 MG tablet; Take 1 tablet (145 mg total) by mouth  daily.  Dispense: 30 tablet; Refill: 5 - atorvastatin (LIPITOR) 40 MG tablet; Take 1 tablet (40 mg total) by mouth daily.  Dispense: 30 tablet; Refill: 5 - NMR, lipoprofile  3. COPD with asthma Avoid being outside doing yard work without mask - methylPREDNISolone acetate (DEPO-MEDROL) injection 80 mg; Inject 1 mL (80 mg total) into the muscle once. - ipratropium-albuterol (DUONEB) 0.5-2.5 (3) MG/3ML SOLN; Take 3 mLs by nebulization 3 (three) times daily as needed.  Dispense: 180 mL; Refill: 3  Labs pending Health maintenance reviewed Diet and exercise encouraged Continue all meds Follow up  In 3 month   [place], FNP

## 2014-04-28 NOTE — Patient Instructions (Signed)

## 2014-04-29 LAB — CMP14+EGFR
A/G RATIO: 2.3 (ref 1.1–2.5)
ALBUMIN: 4.5 g/dL (ref 3.5–5.5)
ALK PHOS: 61 IU/L (ref 39–117)
ALT: 35 IU/L (ref 0–44)
AST: 29 IU/L (ref 0–40)
BILIRUBIN TOTAL: 0.4 mg/dL (ref 0.0–1.2)
BUN / CREAT RATIO: 16 (ref 9–20)
BUN: 18 mg/dL (ref 6–24)
CO2: 25 mmol/L (ref 18–29)
Calcium: 9.8 mg/dL (ref 8.7–10.2)
Chloride: 98 mmol/L (ref 97–108)
Creatinine, Ser: 1.13 mg/dL (ref 0.76–1.27)
GFR, EST AFRICAN AMERICAN: 86 mL/min/{1.73_m2} (ref >59)
GFR, EST NON AFRICAN AMERICAN: 74 mL/min/{1.73_m2} (ref >59)
Globulin, Total: 2 g/dL (ref 1.5–4.5)
Glucose: 86 mg/dL (ref 65–99)
POTASSIUM: 4.4 mmol/L (ref 3.5–5.2)
Sodium: 144 mmol/L (ref 134–144)
Total Protein: 6.5 g/dL (ref 6.0–8.5)

## 2014-04-29 LAB — NMR, LIPOPROFILE
CHOLESTEROL: 148 mg/dL (ref 100–199)
HDL Cholesterol by NMR: 39 mg/dL — ABNORMAL LOW (ref >39)
HDL Particle Number: 35 umol/L (ref >=30.5)
LDL PARTICLE NUMBER: 1147 nmol/L — AB (ref <1000)
LDL SIZE: 19.7 nm (ref >20.5)
LDLC SERPL CALC-MCNC: 61 mg/dL (ref 0–99)
LP-IR SCORE: 95 — AB (ref <=45)
Small LDL Particle Number: 977 nmol/L — ABNORMAL HIGH (ref <=527)
Triglycerides by NMR: 239 mg/dL — ABNORMAL HIGH (ref 0–149)

## 2014-08-25 ENCOUNTER — Telehealth: Payer: Self-pay | Admitting: Nurse Practitioner

## 2014-08-25 ENCOUNTER — Ambulatory Visit (INDEPENDENT_AMBULATORY_CARE_PROVIDER_SITE_OTHER): Payer: Medicare Other | Admitting: Nurse Practitioner

## 2014-08-25 ENCOUNTER — Encounter: Payer: Self-pay | Admitting: Nurse Practitioner

## 2014-08-25 VITALS — BP 148/95 | HR 81 | Temp 97.0°F | Ht 73.0 in | Wt 314.0 lb

## 2014-08-25 DIAGNOSIS — J441 Chronic obstructive pulmonary disease with (acute) exacerbation: Secondary | ICD-10-CM

## 2014-08-25 DIAGNOSIS — J449 Chronic obstructive pulmonary disease, unspecified: Secondary | ICD-10-CM

## 2014-08-25 MED ORDER — BUDESONIDE-FORMOTEROL FUMARATE 160-4.5 MCG/ACT IN AERO: 2.0000 | INHALATION_SPRAY | Freq: Two times a day (BID) | RESPIRATORY_TRACT | Status: DC

## 2014-08-25 MED ORDER — TIOTROPIUM BROMIDE MONOHYDRATE 18 MCG IN CAPS: 1.0000 | ORAL_CAPSULE | Freq: Every day | RESPIRATORY_TRACT | Status: DC

## 2014-08-25 MED ORDER — PREDNISONE 20 MG PO TABS
ORAL_TABLET | ORAL | Status: DC
Start: 2014-08-25 — End: 2014-10-28

## 2014-08-25 MED ORDER — HYDROCODONE-HOMATROPINE 5-1.5 MG/5ML PO SYRP: 5.0000 mL | ORAL_SOLUTION | Freq: Four times a day (QID) | ORAL | Status: DC | PRN

## 2014-08-25 MED ORDER — LEVALBUTEROL HCL 1.25 MG/3ML IN NEBU
1.2500 mg | INHALATION_SOLUTION | Freq: Once | RESPIRATORY_TRACT | Status: AC
  Administered 2014-08-25: 1.25 mg via RESPIRATORY_TRACT

## 2014-08-25 MED ORDER — METHYLPREDNISOLONE ACETATE 80 MG/ML IJ SUSP
80.0000 mg | Freq: Once | INTRAMUSCULAR | Status: AC
  Administered 2014-08-25: 80 mg via INTRAMUSCULAR

## 2014-08-25 MED ORDER — IPRATROPIUM-ALBUTEROL 0.5-2.5 (3) MG/3ML IN SOLN: 3.0000 mL | Freq: Three times a day (TID) | RESPIRATORY_TRACT | Status: DC | PRN

## 2014-08-25 NOTE — Progress Notes (Signed)
Subjective:    Patient ID: Kurt Blevins, male    DOB: 05-27-1961, 54 y.o.   MRN: [id number]  HPI Patient in today c/o wheezing and SOB- Has a long history of COPd- has had increasing wheezing for 2 weeks- Has ran out of spiriva 2 weeks ago which he thinks has exacerbated his symptoms. Still doing symbicort daily and having duoneb prn- has needed daily lately.    Review of Systems  Constitutional: Negative.   Respiratory: Positive for cough, shortness of breath, wheezing and stridor.   Cardiovascular: Negative.   Gastrointestinal: Negative.   Genitourinary: Negative.   Neurological: Negative.   Hematological: Negative.   Psychiatric/Behavioral: Negative.   All other systems reviewed and are negative.      Objective:   Physical Exam  Constitutional: He is oriented to person, place, and time. He appears well-developed and well-nourished. He appears distressed.  HENT:  Head: Normocephalic.  Right Ear: External ear normal.  Left Ear: External ear normal.  Nose: Nose normal.  Mouth/Throat: Oropharynx is clear and moist.  Eyes: Pupils are equal, round, and reactive to light.  Neck: Normal range of motion. Neck supple.  Cardiovascular: Normal rate, regular rhythm and normal heart sounds.   Pulmonary/Chest: He is in respiratory distress. He has wheezes (expiratory wheezes with diminished breath sounds).  Neurological: He is alert and oriented to person, place, and time.  Skin: Skin is warm and dry.  Psychiatric: He has a normal mood and affect. His behavior is normal. Judgment and thought content normal.    BP 148/95 mmHg  Pulse 81  Temp(Src) 97 F (36.1 C) (Oral)  Ht 6\' 1"  (1.854 m)  Wt 314 lb (142.429 kg)  BMI 41.44 kg/m2  S/p xopenex neb- increased air exchange with faniter expiratory wheezes      Assessment & Plan:   1. COPD exacerbation   2. COPD with asthma    Meds ordered this encounter  Medications  . levalbuterol (XOPENEX) nebulizer solution 1.25 mg   Sig:   . methylPREDNISolone acetate (DEPO-MEDROL) injection 80 mg    Sig:   . ipratropium-albuterol (DUONEB) 0.5-2.5 (3) MG/3ML SOLN    Sig: Take 3 mLs by nebulization 3 (three) times daily as needed.    Dispense:  180 mL    Refill:  3    Order Specific Question:  Supervising Provider    Answer:  Chipper Herb [[id number]]  . budesonide-formoterol (SYMBICORT) 160-4.5 MCG/ACT inhaler    Sig: Inhale 2 puffs into the lungs 2 (two) times daily.    Dispense:  2 Inhaler    Refill:  5    Order Specific Question:  Supervising Provider    Answer:  Chipper Herb [[id number]]  . tiotropium (SPIRIVA HANDIHALER) 18 MCG inhalation capsule    Sig: Place 1 capsule (18 mcg total) into inhaler and inhale daily.    Dispense:  30 capsule    Refill:  5    Order Specific Question:  Supervising Provider    Answer:  Chipper Herb [[id number]]  . predniSONE (DELTASONE) 20 MG tablet    Sig: 2 Tablets PO at the same time daily for 5 days- do not start until tomorrow    Dispense:  10 tablet    Refill:  0    Order Specific Question:  Supervising Provider    Answer:  Chipper Herb [[id number]]  . HYDROcodone-homatropine (HYCODAN) 5-1.5 MG/5ML syrup 5 mL    Sig:    Force fluids Rest Nebs as needed  Tell wife to quit smoking- you do not need to be around cigarette smoke RTO prn  Mary-Margaret Hassell Done, FNP

## 2014-08-25 NOTE — Telephone Encounter (Signed)
Patient aware that we will take care of at appointment.

## 2014-08-25 NOTE — Patient Instructions (Signed)

## 2014-10-28 ENCOUNTER — Encounter: Payer: Self-pay | Admitting: Nurse Practitioner

## 2014-10-28 ENCOUNTER — Ambulatory Visit (INDEPENDENT_AMBULATORY_CARE_PROVIDER_SITE_OTHER): Payer: Medicare Other | Admitting: Nurse Practitioner

## 2014-10-28 VITALS — BP 145/91 | HR 69 | Temp 96.7°F | Ht 73.0 in | Wt 321.0 lb

## 2014-10-28 DIAGNOSIS — J449 Chronic obstructive pulmonary disease, unspecified: Secondary | ICD-10-CM | POA: Diagnosis not present

## 2014-10-28 DIAGNOSIS — I10 Essential (primary) hypertension: Secondary | ICD-10-CM

## 2014-10-28 DIAGNOSIS — E785 Hyperlipidemia, unspecified: Secondary | ICD-10-CM | POA: Diagnosis not present

## 2014-10-28 MED ORDER — UMECLIDINIUM-VILANTEROL 62.5-25 MCG/INH IN AEPB: 1.0000 | INHALATION_SPRAY | Freq: Every day | RESPIRATORY_TRACT | Status: DC

## 2014-10-28 MED ORDER — FENOFIBRATE 145 MG PO TABS: 145.0000 mg | ORAL_TABLET | Freq: Every day | ORAL | Status: DC

## 2014-10-28 MED ORDER — IPRATROPIUM-ALBUTEROL 0.5-2.5 (3) MG/3ML IN SOLN: 3.0000 mL | Freq: Three times a day (TID) | RESPIRATORY_TRACT | Status: DC | PRN

## 2014-10-28 MED ORDER — ATORVASTATIN CALCIUM 40 MG PO TABS: 40.0000 mg | ORAL_TABLET | Freq: Every day | ORAL | Status: DC

## 2014-10-28 MED ORDER — LISINOPRIL-HYDROCHLOROTHIAZIDE 20-25 MG PO TABS: 1.0000 | ORAL_TABLET | Freq: Every day | ORAL | Status: DC

## 2014-10-28 NOTE — Progress Notes (Signed)
Subjective:    Patient ID: Kurt Blevins, male    DOB: [birth year], 54 y.o.   MRN: [id number]  Patient in today for follow up of chronic medical problems. He is doing weel today without complaints.  Hypertension This is a chronic problem. The current episode started more than 1 year ago. The problem is controlled. Pertinent negatives include no neck pain, palpitations or shortness of breath. Risk factors for coronary artery disease include dyslipidemia, male gender, obesity and sedentary lifestyle. Past treatments include ACE inhibitors and diuretics. The current treatment provides moderate improvement. Compliance problems include diet and exercise.   Hyperlipidemia This is a chronic problem. The current episode started more than 1 year ago. Recent lipid tests were reviewed and are variable. Factors aggravating his hyperlipidemia include thiazides. Pertinent negatives include no myalgias or shortness of breath. Current antihyperlipidemic treatment includes statins and fibric acid derivatives. The current treatment provides moderate improvement of lipids. Compliance problems include adherence to diet and adherence to exercise.  Risk factors for coronary artery disease include dyslipidemia, hypertension and obesity.  COPD Patient doing [place] and albuterol neb- Patient has not been using spirivia or symbicort as RX. Patient says that albuterol helps the most. Spirometry was done at last visit which was FEV1 75%. Spirivia and symbicort are to expensive and has not been getting    Review of Systems  Respiratory: Positive for cough and wheezing. Negative for shortness of breath.   Cardiovascular: Negative for palpitations.  Musculoskeletal: Negative for myalgias and neck pain.       Objective:   Physical Exam  Constitutional: He is oriented to person, place, and time. He appears well-developed and well-nourished. No distress.  HENT:  Head: Normocephalic.  Right Ear: External ear normal.   Left Ear: External ear normal.  Nose: Nose normal.  Mouth/Throat: Oropharynx is clear and moist.  Eyes: Conjunctivae and EOM are normal. Pupils are equal, round, and reactive to light.  Neck: Normal range of motion. Neck supple. No JVD present. No thyromegaly present.  Cardiovascular: Normal rate, regular rhythm, normal heart sounds and intact distal pulses.  Exam reveals no gallop and no friction rub.   No murmur heard. Pulmonary/Chest: Effort normal. No respiratory distress. He has wheezes (insp and exp throughout). He has no rales. He exhibits no tenderness.  Abdominal: Soft. Bowel sounds are normal. He exhibits no mass. There is no tenderness.  Genitourinary: Penis normal.  Refuses prostate exam   Musculoskeletal: Normal range of motion. He exhibits no edema.  Lymphadenopathy:    He has no cervical adenopathy.  Neurological: He is alert and oriented to person, place, and time. No cranial nerve deficit.  Skin: Skin is warm and dry.  Psychiatric: He has a normal mood and affect. His behavior is normal. Judgment and thought content normal.    BP 145/91 mmHg  Pulse 69  Temp(Src) 96.7 F (35.9 C) (Oral)  Ht '6\' 1"'  (1.854 m)  Wt 321 lb (145.605 kg)  BMI 42.36 kg/m2  SpO2 96%     Assessment & Plan:    1. Essential hypertension Do  Not add slat to diet - lisinopril-hydrochlorothiazide (PRINZIDE,ZESTORETIC) 20-25 MG per tablet; Take 1 tablet by mouth daily.  Dispense: 30 tablet; Refill: 5 - CMP14+EGFR  2. COPD with asthma Changed symbicort and spirivi to anoro - ipratropium-albuterol (DUONEB) 0.5-2.5 (3) MG/3ML SOLN; Take 3 mLs by nebulization 3 (three) times daily as needed.  Dispense: 180 mL; Refill: 3 - Umeclidinium-Vilanterol (ANORO ELLIPTA) 62.5-25 MCG/INH AEPB; Inhale 1 puff  into the lungs daily.  Dispense: 1 each; Refill: 0  3. Hyperlipidemia Low fat diet - atorvastatin (LIPITOR) 40 MG tablet; Take 1 tablet (40 mg total) by mouth daily.  Dispense: 30 tablet; Refill:  5 - fenofibrate (TRICOR) 145 MG tablet; Take 1 tablet (145 mg total) by mouth daily.  Dispense: 30 tablet; Refill: 5 - NMR, lipoprofile  4. Severe obesity (BMI >= 40) Discussed diet and exercise for person with BMI >25 Will recheck weight in 3-6 months     Labs pending Health maintenance reviewed Diet and exercise encouraged Continue all meds Follow up  In 3 month   [place], FNP

## 2014-10-28 NOTE — Patient Instructions (Signed)

## 2014-10-29 LAB — CMP14+EGFR
A/G RATIO: 2 (ref 1.1–2.5)
ALBUMIN: 4.6 g/dL (ref 3.5–5.5)
ALK PHOS: 68 IU/L (ref 39–117)
ALT: 35 IU/L (ref 0–44)
AST: 26 IU/L (ref 0–40)
BILIRUBIN TOTAL: 0.3 mg/dL (ref 0.0–1.2)
BUN/Creatinine Ratio: 17 (ref 9–20)
BUN: 19 mg/dL (ref 6–24)
CO2: 22 mmol/L (ref 18–29)
Calcium: 9.8 mg/dL (ref 8.7–10.2)
Chloride: 101 mmol/L (ref 97–108)
Creatinine, Ser: 1.11 mg/dL (ref 0.76–1.27)
GFR calc non Af Amer: 75 mL/min/{1.73_m2} (ref >59)
GFR, EST AFRICAN AMERICAN: 87 mL/min/{1.73_m2} (ref >59)
GLOBULIN, TOTAL: 2.3 g/dL (ref 1.5–4.5)
GLUCOSE: 120 mg/dL — AB (ref 65–99)
Potassium: 4.8 mmol/L (ref 3.5–5.2)
Sodium: 141 mmol/L (ref 134–144)
Total Protein: 6.9 g/dL (ref 6.0–8.5)

## 2014-10-29 LAB — NMR, LIPOPROFILE
Cholesterol: 152 mg/dL (ref 100–199)
HDL Cholesterol by NMR: 33 mg/dL — ABNORMAL LOW (ref >39)
HDL Particle Number: 30.2 umol/L — ABNORMAL LOW (ref >=30.5)
LDL Particle Number: 984 nmol/L (ref <1000)
LDL Size: 19.9 nm (ref >20.5)
LDL-C: 59 mg/dL (ref 0–99)
LP-IR Score: 89 — ABNORMAL HIGH (ref <=45)
Small LDL Particle Number: 716 nmol/L — ABNORMAL HIGH (ref <=527)
Triglycerides by NMR: 298 mg/dL — ABNORMAL HIGH (ref 0–149)

## 2015-02-03 ENCOUNTER — Encounter (INDEPENDENT_AMBULATORY_CARE_PROVIDER_SITE_OTHER): Payer: Self-pay

## 2015-02-03 ENCOUNTER — Ambulatory Visit (INDEPENDENT_AMBULATORY_CARE_PROVIDER_SITE_OTHER): Payer: Medicare Other | Admitting: Nurse Practitioner

## 2015-02-03 ENCOUNTER — Encounter: Payer: Self-pay | Admitting: Nurse Practitioner

## 2015-02-03 VITALS — BP 133/88 | HR 68 | Temp 98.6°F | Ht 73.0 in | Wt 319.0 lb

## 2015-02-03 DIAGNOSIS — I10 Essential (primary) hypertension: Secondary | ICD-10-CM

## 2015-02-03 DIAGNOSIS — R799 Abnormal finding of blood chemistry, unspecified: Secondary | ICD-10-CM | POA: Diagnosis not present

## 2015-02-03 DIAGNOSIS — Z125 Encounter for screening for malignant neoplasm of prostate: Secondary | ICD-10-CM | POA: Diagnosis not present

## 2015-02-03 DIAGNOSIS — J449 Chronic obstructive pulmonary disease, unspecified: Secondary | ICD-10-CM | POA: Diagnosis not present

## 2015-02-03 DIAGNOSIS — E785 Hyperlipidemia, unspecified: Secondary | ICD-10-CM | POA: Diagnosis not present

## 2015-02-03 MED ORDER — FLUTICASONE-SALMETEROL 100-50 MCG/DOSE IN AEPB: 1.0000 | INHALATION_SPRAY | Freq: Two times a day (BID) | RESPIRATORY_TRACT | Status: DC

## 2015-02-03 MED ORDER — LISINOPRIL-HYDROCHLOROTHIAZIDE 20-25 MG PO TABS: 1.0000 | ORAL_TABLET | Freq: Every day | ORAL | Status: DC

## 2015-02-03 MED ORDER — IPRATROPIUM-ALBUTEROL 0.5-2.5 (3) MG/3ML IN SOLN: 3.0000 mL | Freq: Three times a day (TID) | RESPIRATORY_TRACT | Status: DC | PRN

## 2015-02-03 MED ORDER — ATORVASTATIN CALCIUM 40 MG PO TABS: 40.0000 mg | ORAL_TABLET | Freq: Every day | ORAL | Status: DC

## 2015-02-03 MED ORDER — FENOFIBRATE 145 MG PO TABS: 145.0000 mg | ORAL_TABLET | Freq: Every day | ORAL | Status: DC

## 2015-02-03 NOTE — Addendum Note (Signed)
Addended by: Chevis Pretty on: 02/03/2015 10:38 AM   Modules accepted: Orders

## 2015-02-03 NOTE — Patient Instructions (Signed)
Fat and Cholesterol Control Diet Fat and cholesterol levels in your blood and organs are influenced by your diet. High levels of fat and cholesterol may lead to diseases of the heart, small and large blood vessels, gallbladder, liver, and pancreas. CONTROLLING FAT AND CHOLESTEROL WITH DIET Although exercise and lifestyle factors are important, your diet is key. That is because certain foods are known to raise cholesterol and others to lower it. The goal is to balance foods for their effect on cholesterol and more importantly, to replace saturated and trans fat with other types of fat, such as monounsaturated fat, polyunsaturated fat, and omega-3 fatty acids. On average, a person should consume no more than 15 to 17 g of saturated fat daily. Saturated and trans fats are considered "bad" fats, and they will raise LDL cholesterol. Saturated fats are primarily found in animal products such as meats, butter, and cream. However, that does not mean you need to give up all your favorite foods. Today, there are good tasting, low-fat, low-cholesterol substitutes for most of the things you like to eat. Choose low-fat or nonfat alternatives. Choose round or loin cuts of red meat. These types of cuts are lowest in fat and cholesterol. Chicken (without the skin), fish, veal, and ground turkey breast are great choices. Eliminate fatty meats, such as hot dogs and salami. Even shellfish have little or no saturated fat. Have a 3 oz (85 g) portion when you eat lean meat, poultry, or fish. Trans fats are also called "partially hydrogenated oils." They are oils that have been scientifically manipulated so that they are solid at room temperature resulting in a longer shelf life and improved taste and texture of foods in which they are added. Trans fats are found in stick margarine, some tub margarines, cookies, crackers, and baked goods.  When baking and cooking, oils are a great substitute for butter. The monounsaturated oils are  especially beneficial since it is believed they lower LDL and raise HDL. The oils you should avoid entirely are saturated tropical oils, such as coconut and palm.  Remember to eat a lot from food groups that are naturally free of saturated and trans fat, including fish, fruit, vegetables, beans, grains (barley, rice, couscous, bulgur wheat), and pasta (without cream sauces).  IDENTIFYING FOODS THAT LOWER FAT AND CHOLESTEROL  Soluble fiber may lower your cholesterol. This type of fiber is found in fruits such as apples, vegetables such as broccoli, potatoes, and carrots, legumes such as beans, peas, and lentils, and grains such as barley. Foods fortified with plant sterols (phytosterol) may also lower cholesterol. You should eat at least 2 g per day of these foods for a cholesterol lowering effect.  Read package labels to identify low-saturated fats, trans fat free, and low-fat foods at the supermarket. Select cheeses that have only 2 to 3 g saturated fat per ounce. Use a heart-healthy tub margarine that is free of trans fats or partially hydrogenated oil. When buying baked goods (cookies, crackers), avoid partially hydrogenated oils. Breads and muffins should be made from whole grains (whole-wheat or whole oat flour, instead of "flour" or "enriched flour"). Buy non-creamy canned soups with reduced salt and no added fats.  FOOD PREPARATION TECHNIQUES  Never deep-fry. If you must fry, either stir-fry, which uses very little fat, or use non-stick cooking sprays. When possible, broil, bake, or roast meats, and steam vegetables. Instead of putting butter or margarine on vegetables, use lemon and herbs, applesauce, and cinnamon (for squash and sweet potatoes). Use nonfat   yogurt, salsa, and low-fat dressings for salads.  LOW-SATURATED FAT / LOW-FAT FOOD SUBSTITUTES Meats / Saturated Fat (g)  Avoid: Steak, marbled (3 oz/85 g) / 11 g  Choose: Steak, lean (3 oz/85 g) / 4 g  Avoid: Hamburger (3 oz/85 g) / 7  g  Choose: Hamburger, lean (3 oz/85 g) / 5 g  Avoid: Ham (3 oz/85 g) / 6 g  Choose: Ham, lean cut (3 oz/85 g) / 2.4 g  Avoid: Chicken, with skin, dark meat (3 oz/85 g) / 4 g  Choose: Chicken, skin removed, dark meat (3 oz/85 g) / 2 g  Avoid: Chicken, with skin, light meat (3 oz/85 g) / 2.5 g  Choose: Chicken, skin removed, light meat (3 oz/85 g) / 1 g Dairy / Saturated Fat (g)  Avoid: Whole milk (1 cup) / 5 g  Choose: Low-fat milk, 2% (1 cup) / 3 g  Choose: Low-fat milk, 1% (1 cup) / 1.5 g  Choose: Skim milk (1 cup) / 0.3 g  Avoid: Hard cheese (1 oz/28 g) / 6 g  Choose: Skim milk cheese (1 oz/28 g) / 2 to 3 g  Avoid: Cottage cheese, 4% fat (1 cup) / 6.5 g  Choose: Low-fat cottage cheese, 1% fat (1 cup) / 1.5 g  Avoid: Ice cream (1 cup) / 9 g  Choose: Sherbet (1 cup) / 2.5 g  Choose: Nonfat frozen yogurt (1 cup) / 0.3 g  Choose: Frozen fruit bar / trace  Avoid: Whipped cream (1 tbs) / 3.5 g  Choose: Nondairy whipped topping (1 tbs) / 1 g Condiments / Saturated Fat (g)  Avoid: Mayonnaise (1 tbs) / 2 g  Choose: Low-fat mayonnaise (1 tbs) / 1 g  Avoid: Butter (1 tbs) / 7 g  Choose: Extra light margarine (1 tbs) / 1 g  Avoid: Coconut oil (1 tbs) / 11.8 g  Choose: Olive oil (1 tbs) / 1.8 g  Choose: Corn oil (1 tbs) / 1.7 g  Choose: Safflower oil (1 tbs) / 1.2 g  Choose: Sunflower oil (1 tbs) / 1.4 g  Choose: Soybean oil (1 tbs) / 2.4 g  Choose: Canola oil (1 tbs) / 1 g Document Released: 07/09/2005 Document Revised: 11/03/2012 Document Reviewed: 10/07/2013 [employer] Patient Information 2015 [employer]. This information is not intended to replace advice given to you by your health care provider. Make sure you discuss any questions you have with your health care provider.  

## 2015-02-03 NOTE — Addendum Note (Signed)
Addended by: Pollyann Kennedy F on: 02/03/2015 10:41 AM   Modules accepted: Orders

## 2015-02-03 NOTE — Progress Notes (Addendum)
Subjective:    Patient ID: BLAIR LUNDEEN, male    DOB: Nov 14, 1960, 54 y.o.   MRN: [id number]  Patient in today for follow up of chronic medical problems. He is doing weel today without complaints.  Hypertension This is a chronic problem. The current episode started more than 1 year ago. The problem is controlled. Pertinent negatives include no neck pain, palpitations or shortness of breath. Risk factors for coronary artery disease include dyslipidemia, male gender, obesity and sedentary lifestyle. Past treatments include ACE inhibitors and diuretics. The current treatment provides moderate improvement. Compliance problems include diet and exercise.   Hyperlipidemia This is a chronic problem. The current episode started more than 1 year ago. Recent lipid tests were reviewed and are variable. Factors aggravating his hyperlipidemia include thiazides. Pertinent negatives include no myalgias or shortness of breath. Current antihyperlipidemic treatment includes statins and fibric acid derivatives. The current treatment provides moderate improvement of lipids. Compliance problems include adherence to diet and adherence to exercise.  Risk factors for coronary artery disease include dyslipidemia, hypertension and obesity.  COPD Patient doing [place] and albuterol neb- Patient has not been using spirivia or symbicort as RX. Patient says that albuterol helps the most. Spirometry was done at last visit which was FEV1 75%. Spirivia and symbicort are to expensive and has not been getting. He says that pollen out has falred him up some but he is doing ok.    Review of Systems  Constitutional: Negative.   HENT: Negative.   Respiratory: Positive for cough and wheezing. Negative for shortness of breath.   Cardiovascular: Negative for palpitations.  Gastrointestinal: Negative.   Genitourinary: Negative.   Musculoskeletal: Negative for myalgias and neck pain.  Neurological: Negative.   Psychiatric/Behavioral:  Negative.   All other systems reviewed and are negative.      Objective:   Physical Exam  Constitutional: He is oriented to person, place, and time. He appears well-developed and well-nourished. No distress.  HENT:  Head: Normocephalic.  Right Ear: External ear normal.  Left Ear: External ear normal.  Nose: Nose normal.  Mouth/Throat: Oropharynx is clear and moist.  Eyes: Conjunctivae and EOM are normal. Pupils are equal, round, and reactive to light.  Neck: Normal range of motion. Neck supple. No JVD present. No thyromegaly present.  Cardiovascular: Normal rate, regular rhythm, normal heart sounds and intact distal pulses.  Exam reveals no gallop and no friction rub.   No murmur heard. Pulmonary/Chest: Effort normal. No respiratory distress. He has wheezes (insp and exp throughout). He has no rales. He exhibits no tenderness.  Abdominal: Soft. Bowel sounds are normal. He exhibits no mass. There is no tenderness.  Genitourinary: Penis normal.  Refuses prostate exam   Musculoskeletal: Normal range of motion. He exhibits no edema.  Lymphadenopathy:    He has no cervical adenopathy.  Neurological: He is alert and oriented to person, place, and time. No cranial nerve deficit.  Skin: Skin is warm and dry.  Psychiatric: He has a normal mood and affect. His behavior is normal. Judgment and thought content normal.    BP 133/88 mmHg  Pulse 68  Temp(Src) 98.6 F (37 C) (Oral)  Ht _0  (1.854 m)  Wt 319 lb (144.697 kg)  BMI 42.10 kg/m2  SpO2 95%     Assessment & Plan:   1. Essential hypertension Do not add salt to diet - lisinopril-hydrochlorothiazide (PRINZIDE,ZESTORETIC) 20-25 MG per tablet; Take 1 tablet by mouth daily.  Dispense: 30 tablet; Refill: 5 - CMP14+EGFR  2. COPD with asthma Avoid allergens Added advair to meds- will see if insurance will cover- rinse mouth out after using. - ipratropium-albuterol (DUONEB) 0.5-2.5 (3) MG/3ML SOLN; Take 3 mLs by nebulization 3  (three) times daily as needed.  Dispense: 180 mL; Refill: 3  3. Hyperlipidemia Low fat diet - fenofibrate (TRICOR) 145 MG tablet; Take 1 tablet (145 mg total) by mouth daily.  Dispense: 30 tablet; Refill: 5 - atorvastatin (LIPITOR) 40 MG tablet; Take 1 tablet (40 mg total) by mouth daily.  Dispense: 30 tablet; Refill: 5 - Lipid panel  4. Severe obesity (BMI >= 40) Discussed diet and exercise for person with BMI >25 Will recheck weight in 3-6 months   5. Prostate cancer screening - PSA Total+%Free (Serial)    Labs pending Health maintenance reviewed Diet and exercise encouraged Continue all meds Follow up  In 3 months   [place], FNP

## 2015-02-04 LAB — CMP14+EGFR
ALT: 29 IU/L (ref 0–44)
AST: 26 IU/L (ref 0–40)
Albumin/Globulin Ratio: 2.2 (ref 1.1–2.5)
Albumin: 4.3 g/dL (ref 3.5–5.5)
Alkaline Phosphatase: 61 IU/L (ref 39–117)
BUN/Creatinine Ratio: 14 (ref 9–20)
BUN: 16 mg/dL (ref 6–24)
Bilirubin Total: 0.3 mg/dL (ref 0.0–1.2)
CO2: 25 mmol/L (ref 18–29)
Calcium: 9.5 mg/dL (ref 8.7–10.2)
Chloride: 100 mmol/L (ref 97–108)
Creatinine, Ser: 1.16 mg/dL (ref 0.76–1.27)
GFR calc Af Amer: 83 mL/min/{1.73_m2} (ref >59)
GFR calc non Af Amer: 71 mL/min/{1.73_m2} (ref >59)
GLOBULIN, TOTAL: 2 g/dL (ref 1.5–4.5)
Glucose: 134 mg/dL — ABNORMAL HIGH (ref 65–99)
POTASSIUM: 4.8 mmol/L (ref 3.5–5.2)
SODIUM: 142 mmol/L (ref 134–144)
Total Protein: 6.3 g/dL (ref 6.0–8.5)

## 2015-02-04 LAB — LIPID PANEL
Chol/HDL Ratio: 4.3 ratio units (ref 0.0–5.0)
Cholesterol, Total: 132 mg/dL (ref 100–199)
HDL: 31 mg/dL — ABNORMAL LOW (ref >39)
LDL Calculated: 57 mg/dL (ref 0–99)
Triglycerides: 222 mg/dL — ABNORMAL HIGH (ref 0–149)
VLDL CHOLESTEROL CAL: 44 mg/dL — AB (ref 5–40)

## 2015-02-04 LAB — PSA, TOTAL AND FREE
PSA FREE PCT: 24.4 %
PSA, Free: 0.22 ng/mL
Prostate Specific Ag, Serum: 0.9 ng/mL (ref 0.0–4.0)

## 2015-02-07 LAB — POCT GLYCOSYLATED HEMOGLOBIN (HGB A1C): Hemoglobin A1C: 6.9

## 2015-02-07 NOTE — Addendum Note (Signed)
Addended by: Pollyann Kennedy F on: 02/07/2015 03:37 PM   Modules accepted: Orders

## 2015-03-27 IMAGING — CR DG CHEST 2V
2 series · 2 of 2 positions shown · non-contrast
Comparison: None.

CLINICAL DATA: COPD exacerbation

EXAM:
CHEST  2 VIEW

[view not recorded (1 of 2)]
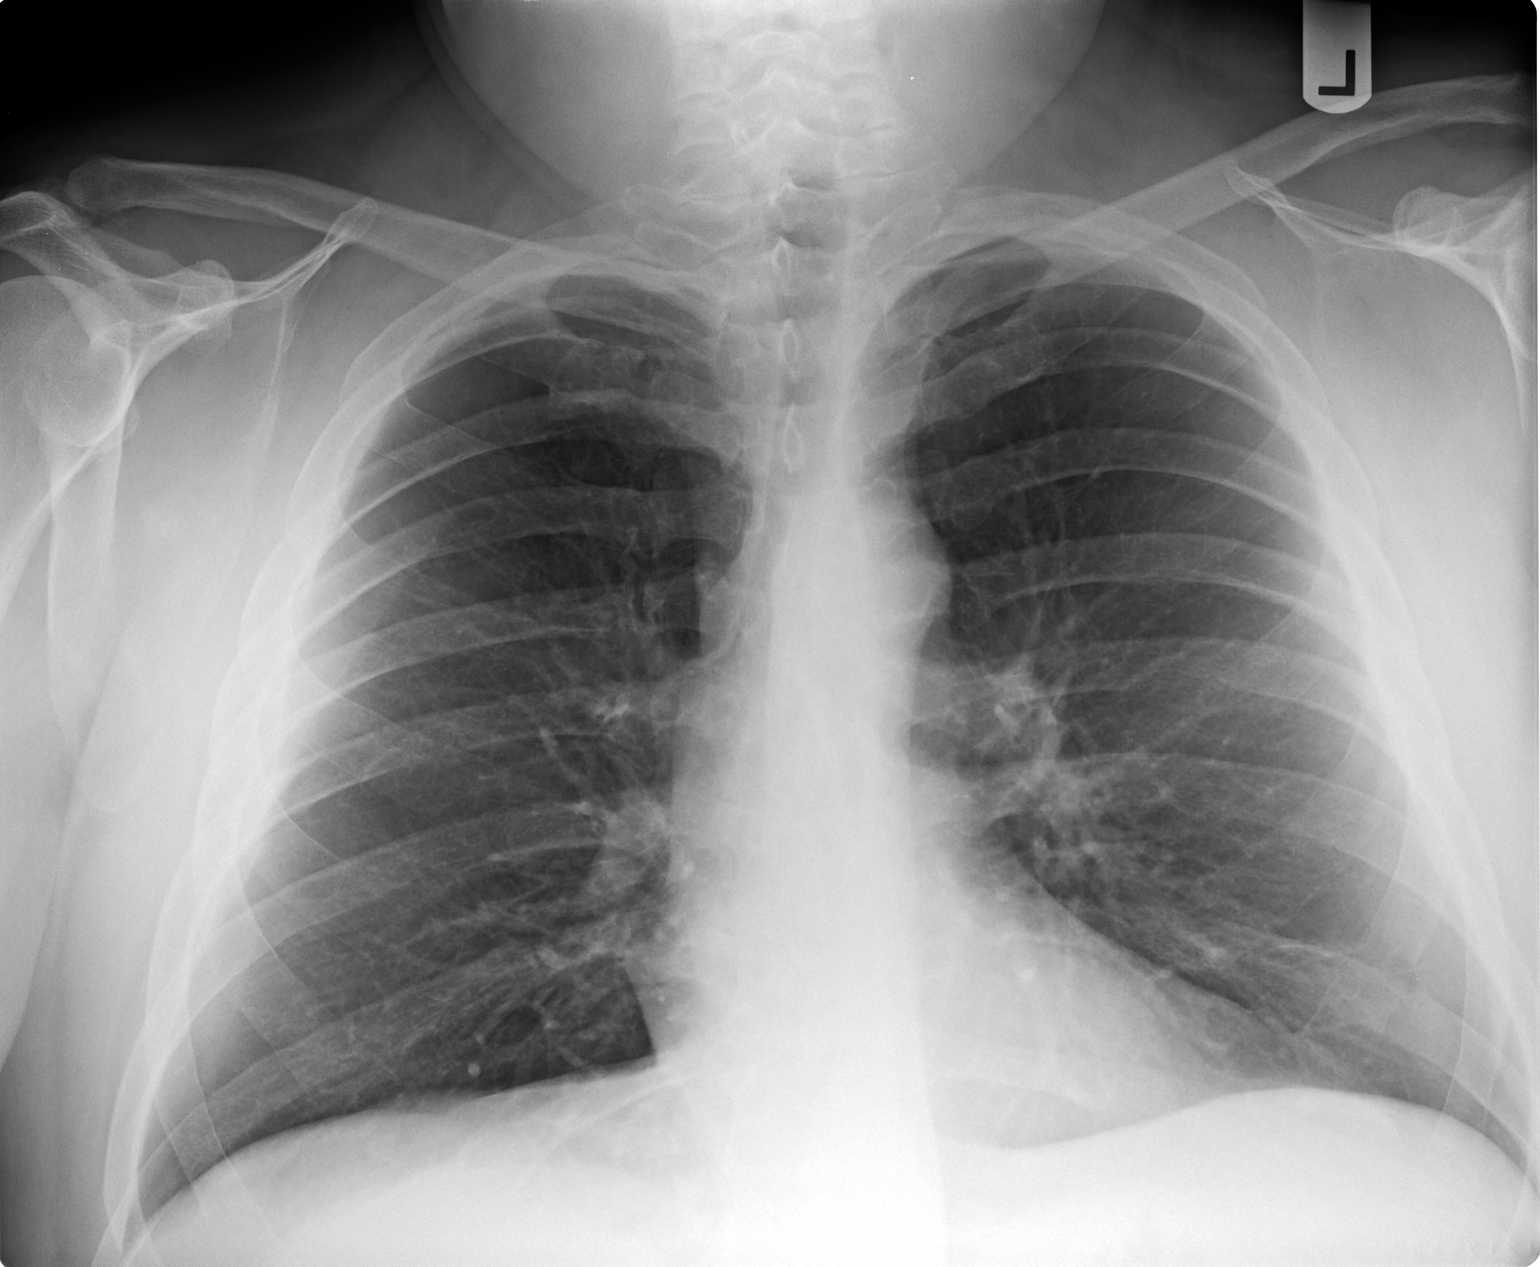

[view not recorded (2 of 2)]
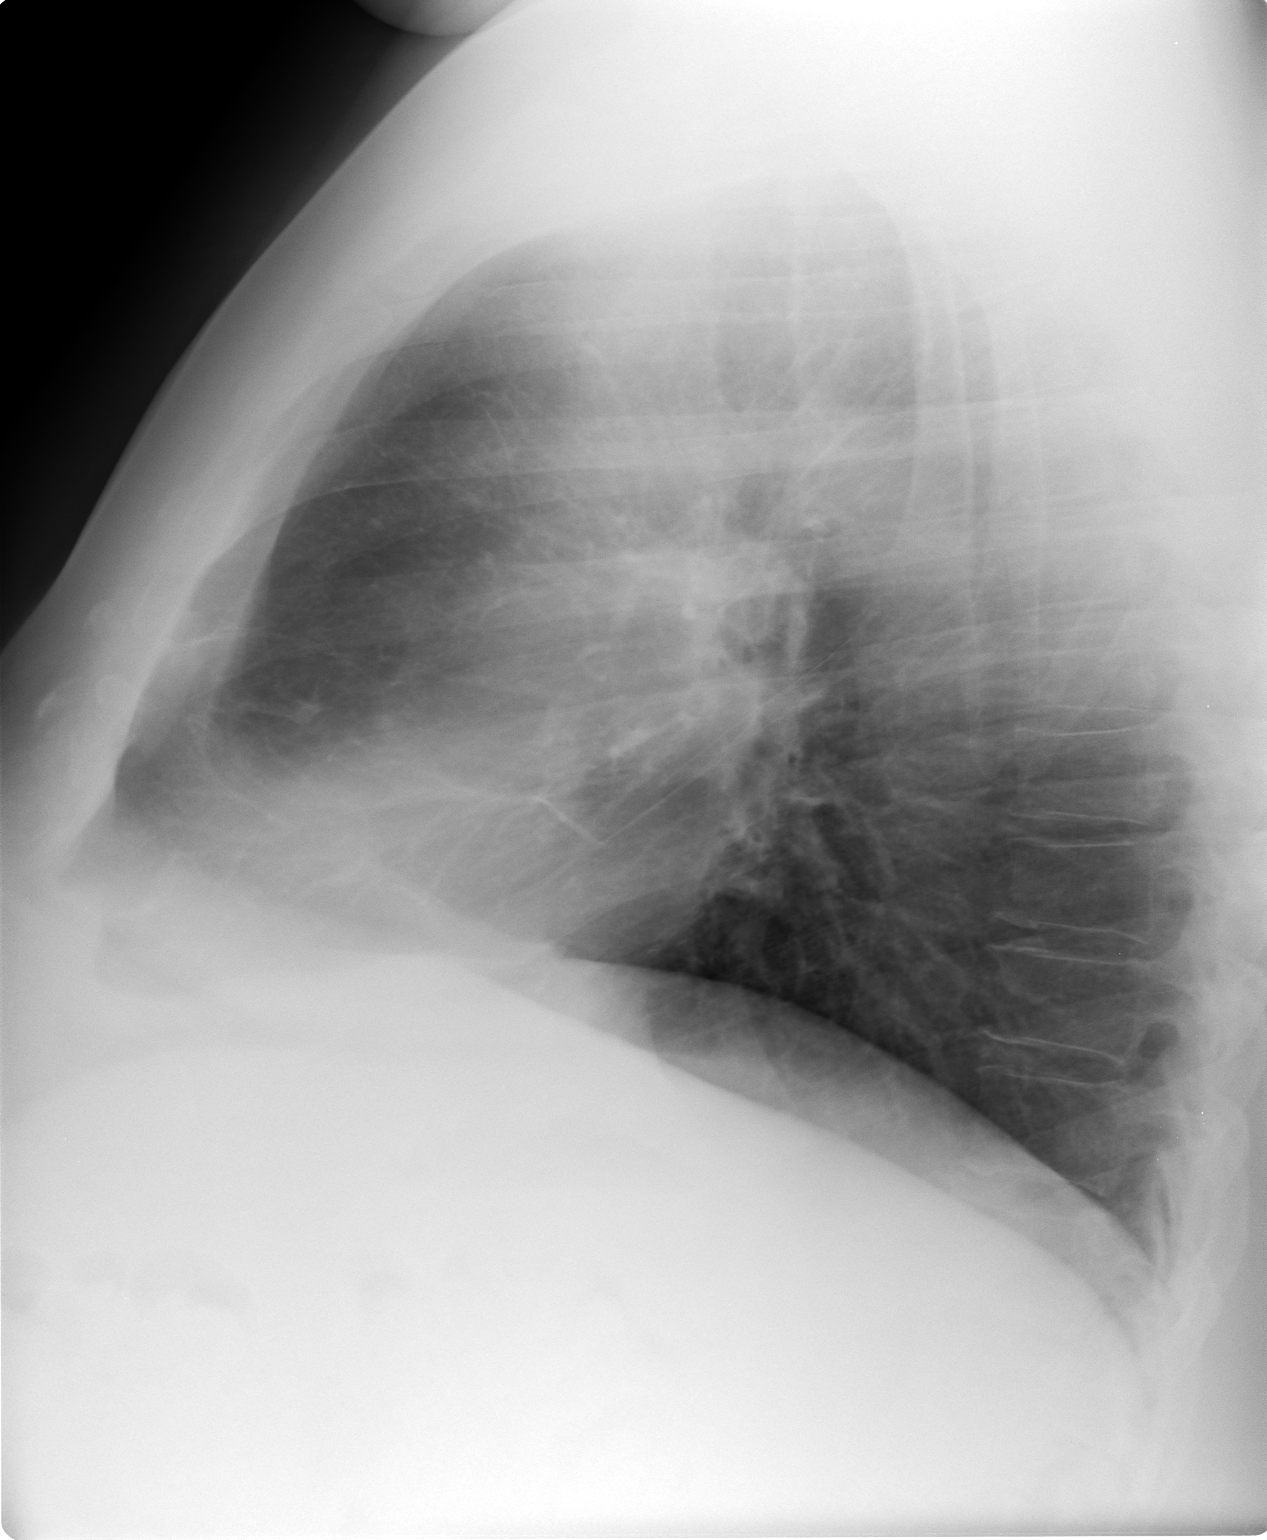

[2 of 2 positions shown; findings below may reference images not displayed]

FINDINGS: Normal cardiac silhouette and mediastinal contours. The lungs appear
hyperexpanded with flattening of the hemidiaphragms mild diffuse
slightly nodular thickening of the pulmonary interstitium. There are
minimal linear heterogeneous opacities within the lingula and right
middle lobe favored to represent subsegmental atelectasis/scar. No
discrete focal airspace opacities. No pleural effusion. A skin fold
overlies the peripheral aspect of the right upper/mid lung. No
Pneumothorax. No definite evidence of edema. No acute osseus
abnormalities.
IMPRESSION: Hyperexpanded lungs with acute cardiopulmonary disease.

## 2015-08-02 ENCOUNTER — Ambulatory Visit (INDEPENDENT_AMBULATORY_CARE_PROVIDER_SITE_OTHER): Payer: Medicare Other | Admitting: Nurse Practitioner

## 2015-08-02 ENCOUNTER — Encounter: Payer: Self-pay | Admitting: Nurse Practitioner

## 2015-08-02 VITALS — BP 132/82 | HR 71 | Temp 96.9°F | Ht 73.0 in | Wt 309.0 lb

## 2015-08-02 DIAGNOSIS — J449 Chronic obstructive pulmonary disease, unspecified: Secondary | ICD-10-CM

## 2015-08-02 DIAGNOSIS — Z1212 Encounter for screening for malignant neoplasm of rectum: Secondary | ICD-10-CM | POA: Diagnosis not present

## 2015-08-02 DIAGNOSIS — I10 Essential (primary) hypertension: Secondary | ICD-10-CM | POA: Diagnosis not present

## 2015-08-02 DIAGNOSIS — J45909 Unspecified asthma, uncomplicated: Secondary | ICD-10-CM | POA: Diagnosis not present

## 2015-08-02 DIAGNOSIS — Z1159 Encounter for screening for other viral diseases: Secondary | ICD-10-CM | POA: Diagnosis not present

## 2015-08-02 DIAGNOSIS — E785 Hyperlipidemia, unspecified: Secondary | ICD-10-CM | POA: Diagnosis not present

## 2015-08-02 MED ORDER — LISINOPRIL-HYDROCHLOROTHIAZIDE 20-25 MG PO TABS: 1.0000 | ORAL_TABLET | Freq: Every day | ORAL | Status: DC

## 2015-08-02 MED ORDER — FENOFIBRATE 160 MG PO TABS: 160.0000 mg | ORAL_TABLET | Freq: Every day | ORAL | Status: DC

## 2015-08-02 MED ORDER — ATORVASTATIN CALCIUM 40 MG PO TABS: 40.0000 mg | ORAL_TABLET | Freq: Every day | ORAL | Status: DC

## 2015-08-02 NOTE — Progress Notes (Signed)
Subjective:    Patient ID: Kurt Blevins, male    DOB: [birth year], 55 y.o.   MRN: [id number]  Patient in today for follow up of chronic medical problems. He is doing weel today without complaints.  Hypertension This is a chronic problem. The current episode started more than 1 year ago. The problem is controlled. Pertinent negatives include no neck pain, palpitations or shortness of breath. Risk factors for coronary artery disease include dyslipidemia, male gender, obesity and sedentary lifestyle. Past treatments include ACE inhibitors and diuretics. The current treatment provides moderate improvement. Compliance problems include diet and exercise.   Hyperlipidemia This is a chronic problem. The current episode started more than 1 year ago. Recent lipid tests were reviewed and are variable. Factors aggravating his hyperlipidemia include thiazides. Pertinent negatives include no myalgias or shortness of breath. Current antihyperlipidemic treatment includes statins and fibric acid derivatives. The current treatment provides moderate improvement of lipids. Compliance problems include adherence to diet and adherence to exercise.  Risk factors for coronary artery disease include dyslipidemia, hypertension and obesity.  COPD Patient doing [place] and albuterol neb- Patient has not been using spirivia or symbicort as RX. Patient says that albuterol helps the most. Spirometry was done at last visit which was FEV1 75%. Spirivia and symbicort are to expensive and has not been getting.   Review of Systems  Constitutional: Negative.   HENT: Negative.   Respiratory: Positive for cough and wheezing. Negative for shortness of breath.   Cardiovascular: Negative for palpitations.  Gastrointestinal: Negative.   Genitourinary: Negative.   Musculoskeletal: Negative for myalgias and neck pain.  Neurological: Negative.   Psychiatric/Behavioral: Negative.   All other systems reviewed and are negative.       Objective:   Physical Exam  Constitutional: He is oriented to person, place, and time. He appears well-developed and well-nourished. No distress.  HENT:  Head: Normocephalic.  Right Ear: External ear normal.  Left Ear: External ear normal.  Nose: Nose normal.  Mouth/Throat: Oropharynx is clear and moist.  Eyes: Conjunctivae and EOM are normal. Pupils are equal, round, and reactive to light.  Neck: Normal range of motion. Neck supple. No JVD present. No thyromegaly present.  Cardiovascular: Normal rate, regular rhythm, normal heart sounds and intact distal pulses.  Exam reveals no gallop and no friction rub.   No murmur heard. Pulmonary/Chest: Effort normal. No respiratory distress. He has wheezes (insp and exp throughout). He has no rales. He exhibits no tenderness.  Abdominal: Soft. Bowel sounds are normal. He exhibits no mass. There is no tenderness.  Genitourinary: Penis normal.  Refuses prostate exam   Musculoskeletal: Normal range of motion. He exhibits no edema.  Lymphadenopathy:    He has no cervical adenopathy.  Neurological: He is alert and oriented to person, place, and time. No cranial nerve deficit.  Skin: Skin is warm and dry.  Psychiatric: He has a normal mood and affect. His behavior is normal. Judgment and thought content normal.   BP 132/82 mmHg  Pulse 71  Temp(Src) 96.9 F (36.1 C) (Oral)  Ht _0  (1.854 m)  Wt 309 lb (140.161 kg)  BMI 40.78 kg/m2       Assessment & Plan:   1. Essential hypertension Do not add salt to diet - lisinopril-hydrochlorothiazide (PRINZIDE,ZESTORETIC) 20-25 MG tablet; Take 1 tablet by mouth daily.  Dispense: 30 tablet; Refill: 5 - CMP14+EGFR  2. Hyperlipidemia Low fat diet  - fenofibrate 160 MG tablet; Take 1 tablet (160 mg total) by mouth  daily.  Dispense: 30 tablet; Refill: 5 - atorvastatin (LIPITOR) 40 MG tablet; Take 1 tablet (40 mg total) by mouth daily.  Dispense: 30 tablet; Refill: 5 - Lipid panel  3. COPD with  asthma ([place]) Keep follow up with pulmonologist - DME Nebulizer machine  4. Morbid obesity, unspecified obesity type ([place]) Discussed diet and exercise for person with BMI >25 Will recheck weight in 3-6 months  5. Need for hepatitis C screening test - Hepatitis C antibody  6. Screening for malignant neoplasm of the rectum - Fecal occult blood, imunochemical; Future    Labs pending Health maintenance reviewed Diet and exercise encouraged Continue all meds Follow up  In 3 months   [place], FNP

## 2015-08-02 NOTE — Patient Instructions (Signed)

## 2015-08-03 LAB — CMP14+EGFR
A/G RATIO: 2 (ref 1.1–2.5)
ALBUMIN: 4.7 g/dL (ref 3.5–5.5)
ALT: 35 IU/L (ref 0–44)
AST: 29 IU/L (ref 0–40)
Alkaline Phosphatase: 70 IU/L (ref 39–117)
BUN / CREAT RATIO: 14 (ref 9–20)
BUN: 16 mg/dL (ref 6–24)
Bilirubin Total: 0.4 mg/dL (ref 0.0–1.2)
CALCIUM: 10 mg/dL (ref 8.7–10.2)
CO2: 25 mmol/L (ref 18–29)
Chloride: 98 mmol/L (ref 96–106)
Creatinine, Ser: 1.16 mg/dL (ref 0.76–1.27)
GFR, EST AFRICAN AMERICAN: 83 mL/min/{1.73_m2} (ref >59)
GFR, EST NON AFRICAN AMERICAN: 71 mL/min/{1.73_m2} (ref >59)
GLOBULIN, TOTAL: 2.3 g/dL (ref 1.5–4.5)
Glucose: 100 mg/dL — ABNORMAL HIGH (ref 65–99)
POTASSIUM: 4.8 mmol/L (ref 3.5–5.2)
SODIUM: 141 mmol/L (ref 134–144)
TOTAL PROTEIN: 7 g/dL (ref 6.0–8.5)

## 2015-08-03 LAB — LIPID PANEL
CHOL/HDL RATIO: 3.8 ratio (ref 0.0–5.0)
Cholesterol, Total: 145 mg/dL (ref 100–199)
HDL: 38 mg/dL — ABNORMAL LOW (ref >39)
LDL CALC: 58 mg/dL (ref 0–99)
Triglycerides: 243 mg/dL — ABNORMAL HIGH (ref 0–149)
VLDL Cholesterol Cal: 49 mg/dL — ABNORMAL HIGH (ref 5–40)

## 2015-08-03 LAB — HEPATITIS C ANTIBODY

## 2015-08-04 ENCOUNTER — Telehealth: Payer: Self-pay | Admitting: Nurse Practitioner

## 2015-08-04 NOTE — Telephone Encounter (Signed)
Left detailed message on voicemail per [facility]

## 2015-09-08 ENCOUNTER — Encounter: Payer: Self-pay | Admitting: Family Medicine

## 2015-09-08 ENCOUNTER — Ambulatory Visit (INDEPENDENT_AMBULATORY_CARE_PROVIDER_SITE_OTHER): Payer: Medicare Other | Admitting: Family Medicine

## 2015-09-08 VITALS — BP 133/79 | HR 78 | Temp 96.8°F | Ht 73.0 in | Wt 305.0 lb

## 2015-09-08 DIAGNOSIS — J45909 Unspecified asthma, uncomplicated: Secondary | ICD-10-CM | POA: Diagnosis not present

## 2015-09-08 DIAGNOSIS — J449 Chronic obstructive pulmonary disease, unspecified: Secondary | ICD-10-CM

## 2015-09-08 DIAGNOSIS — J441 Chronic obstructive pulmonary disease with (acute) exacerbation: Secondary | ICD-10-CM | POA: Diagnosis not present

## 2015-09-08 MED ORDER — ALBUTEROL SULFATE (2.5 MG/3ML) 0.083% IN NEBU
2.5000 mg | INHALATION_SOLUTION | Freq: Once | RESPIRATORY_TRACT | Status: AC
  Administered 2015-09-08: 2.5 mg via RESPIRATORY_TRACT

## 2015-09-08 MED ORDER — DOXYCYCLINE HYCLATE 100 MG PO TABS: 100.0000 mg | ORAL_TABLET | Freq: Two times a day (BID) | ORAL | Status: DC

## 2015-09-08 MED ORDER — IPRATROPIUM-ALBUTEROL 0.5-2.5 (3) MG/3ML IN SOLN: 3.0000 mL | Freq: Three times a day (TID) | RESPIRATORY_TRACT | Status: DC | PRN

## 2015-09-08 MED ORDER — TRIAMCINOLONE ACETONIDE 40 MG/ML IJ SUSP
40.0000 mg | Freq: Once | INTRAMUSCULAR | Status: AC
  Administered 2015-09-08: 40 mg via INTRAMUSCULAR

## 2015-09-08 MED ORDER — PREDNISONE 20 MG PO TABS: 40.0000 mg | ORAL_TABLET | Freq: Every day | ORAL | Status: DC

## 2015-09-08 NOTE — Addendum Note (Signed)
Addended by: Nigel Berthold C on: 09/08/2015 10:14 AM   Modules accepted: Orders

## 2015-09-08 NOTE — Patient Instructions (Addendum)
Great to see you guys, we will see if we can come up with a solution for your difficulty getting medications.   Take all antibiotics  Use duonebs as needed every 4 hours, we can refill this if needed  Chronic Obstructive Pulmonary Disease Chronic obstructive pulmonary disease (COPD) is a common lung condition in which airflow from the lungs is limited. COPD is a general term that can be used to describe many different lung problems that limit airflow, including both chronic bronchitis and emphysema. If you have COPD, your lung function will probably never return to normal, but there are measures you can take to improve lung function and make yourself feel better. CAUSES   Smoking (common).  Exposure to secondhand smoke.  Genetic problems.  Chronic inflammatory lung diseases or recurrent infections. SYMPTOMS  Shortness of breath, especially with physical activity.  Deep, persistent (chronic) cough with a large amount of thick mucus.  Wheezing.  Rapid breaths (tachypnea).  Gray or bluish discoloration (cyanosis) of the skin, especially in your fingers, toes, or lips.  Fatigue.  Weight loss.  Frequent infections or episodes when breathing symptoms become much worse (exacerbations).  Chest tightness. DIAGNOSIS Your health care provider will take a medical history and perform a physical examination to diagnose COPD. Additional tests for COPD may include:  Lung (pulmonary) function tests.  Chest X-ray.  CT scan.  Blood tests. TREATMENT  Treatment for COPD may include:  Inhaler and nebulizer medicines. These help manage the symptoms of COPD and make your breathing more comfortable.  Supplemental oxygen. Supplemental oxygen is only helpful if you have a low oxygen level in your blood.  Exercise and physical activity. These are beneficial for nearly all people with COPD.  Lung surgery or transplant.  Nutrition therapy to gain weight, if you are  underweight.  Pulmonary rehabilitation. This may involve working with a team of health care providers and specialists, such as respiratory, occupational, and physical therapists. HOME CARE INSTRUCTIONS  Take all medicines (inhaled or pills) as directed by your health care provider.  Avoid over-the-counter medicines or cough syrups that dry up your airway (such as antihistamines) and slow down the elimination of secretions unless instructed otherwise by your health care provider.  If you are a smoker, the most important thing that you can do is stop smoking. Continuing to smoke will cause further lung damage and breathing trouble. Ask your health care provider for help with quitting smoking. He or she can direct you to community resources or hospitals that provide support.  Avoid exposure to irritants such as smoke, chemicals, and fumes that aggravate your breathing.  Use oxygen therapy and pulmonary rehabilitation if directed by your health care provider. If you require home oxygen therapy, ask your health care provider whether you should purchase a pulse oximeter to measure your oxygen level at home.  Avoid contact with individuals who have a contagious illness.  Avoid extreme temperature and humidity changes.  Eat healthy foods. Eating smaller, more frequent meals and resting before meals may help you maintain your strength.  Stay active, but balance activity with periods of rest. Exercise and physical activity will help you maintain your ability to do things you want to do.  Preventing infection and hospitalization is very important when you have COPD. Make sure to receive all the vaccines your health care provider recommends, especially the pneumococcal and influenza vaccines. Ask your health care provider whether you need a pneumonia vaccine.  Learn and use relaxation techniques to manage stress.  Learn and use controlled breathing techniques as directed by your health care provider.  Controlled breathing techniques include:  Pursed lip breathing. Start by breathing in (inhaling) through your nose for 1 second. Then, purse your lips as if you were going to whistle and breathe out (exhale) through the pursed lips for 2 seconds.  Diaphragmatic breathing. Start by putting one hand on your abdomen just above your waist. Inhale slowly through your nose. The hand on your abdomen should move out. Then purse your lips and exhale slowly. You should be able to feel the hand on your abdomen moving in as you exhale.  Learn and use controlled coughing to clear mucus from your lungs. Controlled coughing is a series of short, progressive coughs. The steps of controlled coughing are: 1. Lean your head slightly forward. 2. Breathe in deeply using diaphragmatic breathing. 3. Try to hold your breath for 3 seconds. 4. Keep your mouth slightly open while coughing twice. 5. Spit any mucus out into a tissue. 6. Rest and repeat the steps once or twice as needed. SEEK MEDICAL CARE IF:  You are coughing up more mucus than usual.  There is a change in the color or thickness of your mucus.  Your breathing is more labored than usual.  Your breathing is faster than usual. SEEK IMMEDIATE MEDICAL CARE IF:  You have shortness of breath while you are resting.  You have shortness of breath that prevents you from:  Being able to talk.  Performing your usual physical activities.  You have chest pain lasting longer than 5 minutes.  Your skin color is more cyanotic than usual.  You measure low oxygen saturations for longer than 5 minutes with a pulse oximeter. MAKE SURE YOU:  Understand these instructions.  Will watch your condition.  Will get help right away if you are not doing well or get worse.   This information is not intended to replace advice given to you by your health care provider. Make sure you discuss any questions you have with your health care provider.   Document Released:  04/18/2005 Document Revised: 07/30/2014 Document Reviewed: 03/05/2013 Elsevier Interactive Patient Education [employer].

## 2015-09-08 NOTE — Progress Notes (Signed)
   HPI  Patient presents today for cough and cold.  She explains that over the last few weeks she's had increased shortness of breath and cough. Over the last 3 days this is becoming much more severe, he has increased cough, dyspnea, and increased sputum production.  He also complains of bilateral ear pain, bilateral sinus pressure.  He has difficulty making getting his medications due to his deductible on Medicare, he states that he has not been able to afford his inhalers in several months.  He does have DuoNeb's at home which have been helping but he only has enough for twice a day.  He denies new chest pain, he's had some left-sided chest discomfort that's continuous, not exertional, not associated with his cough or dyspnea, and his left chest are about 6 months.  PMH: Smoking status noted ROS: Per HPI  Objective: BP 133/79 mmHg  Pulse 78  Temp(Src) 96.8 F (36 C) (Oral)  Ht 6\' 1"  (1.854 m)  Wt 305 lb (138.347 kg)  BMI 40.25 kg/m2 Gen: NAD, alert, cooperative with exam HEENT: NCAT, right TM thickened with scarred appearance, left TM normal, MMM, oropharynx clear CV: RRR, good S1/S2, no murmur Resp: Mild expiratory wheezes, decreased air movement After nebulizer treatment: increased air movement Ext: No edema, warm Neuro: Alert and oriented, No gross deficits  Assessment and plan:  # COPD exacerbation Treat with doxycycline and prednisone Given intramuscular Kenalog today Improved aeration after albuterol nebulizer Refilled DuoNeb's Given samples of Anoro Return to clinic in 2-3 weeks for recheck  Meds ordered this encounter  Medications  . ipratropium-albuterol (DUONEB) 0.5-2.5 (3) MG/3ML SOLN    Sig: Take 3 mLs by nebulization 3 (three) times daily as needed.    Dispense:  180 mL    Refill:  3  . predniSONE (DELTASONE) 20 MG tablet    Sig: Take 2 tablets (40 mg total) by mouth daily with breakfast.    Dispense:  10 tablet    Refill:  0  . doxycycline  (VIBRA-TABS) 100 MG tablet    Sig: Take 1 tablet (100 mg total) by mouth 2 (two) times daily. 1 po bid    Dispense:  20 tablet    Refill:  0    Laroy Apple, MD [facility] 09/08/2015, 8:12 AM

## 2016-01-28 ENCOUNTER — Other Ambulatory Visit: Payer: Self-pay | Admitting: Nurse Practitioner

## 2016-02-02 ENCOUNTER — Encounter: Payer: Self-pay | Admitting: Nurse Practitioner

## 2016-02-02 ENCOUNTER — Ambulatory Visit (INDEPENDENT_AMBULATORY_CARE_PROVIDER_SITE_OTHER): Payer: Medicare Other | Admitting: Nurse Practitioner

## 2016-02-02 VITALS — BP 145/88 | HR 62 | Temp 98.6°F | Ht 73.0 in | Wt 305.0 lb

## 2016-02-02 DIAGNOSIS — I10 Essential (primary) hypertension: Secondary | ICD-10-CM

## 2016-02-02 DIAGNOSIS — E785 Hyperlipidemia, unspecified: Secondary | ICD-10-CM

## 2016-02-02 DIAGNOSIS — E119 Type 2 diabetes mellitus without complications: Secondary | ICD-10-CM | POA: Diagnosis not present

## 2016-02-02 DIAGNOSIS — J449 Chronic obstructive pulmonary disease, unspecified: Secondary | ICD-10-CM

## 2016-02-02 DIAGNOSIS — J45909 Unspecified asthma, uncomplicated: Secondary | ICD-10-CM

## 2016-02-02 DIAGNOSIS — R7309 Other abnormal glucose: Secondary | ICD-10-CM

## 2016-02-02 DIAGNOSIS — R739 Hyperglycemia, unspecified: Secondary | ICD-10-CM

## 2016-02-02 LAB — BAYER DCA HB A1C WAIVED: HB A1C: 8.2 % — AB (ref <7.0)

## 2016-02-02 MED ORDER — LISINOPRIL-HYDROCHLOROTHIAZIDE 20-25 MG PO TABS: 1.0000 | ORAL_TABLET | Freq: Every day | ORAL | Status: DC

## 2016-02-02 MED ORDER — ATORVASTATIN CALCIUM 40 MG PO TABS: 40.0000 mg | ORAL_TABLET | Freq: Every day | ORAL | Status: DC

## 2016-02-02 MED ORDER — METFORMIN HCL 500 MG PO TABS: 500.0000 mg | ORAL_TABLET | Freq: Two times a day (BID) | ORAL | Status: DC

## 2016-02-02 MED ORDER — LEVALBUTEROL HCL 1.25 MG/0.5ML IN NEBU
1.2500 mg | INHALATION_SOLUTION | RESPIRATORY_TRACT | Status: AC
Start: 2016-02-02 — End: 2016-02-02
  Administered 2016-02-02: 1.25 mg via RESPIRATORY_TRACT

## 2016-02-02 MED ORDER — GLUCOSE BLOOD VI STRP: ORAL_STRIP | Status: DC

## 2016-02-02 MED ORDER — FENOFIBRATE 160 MG PO TABS: 160.0000 mg | ORAL_TABLET | Freq: Every day | ORAL | Status: DC

## 2016-02-02 MED ORDER — IPRATROPIUM-ALBUTEROL 0.5-2.5 (3) MG/3ML IN SOLN: 3.0000 mL | Freq: Three times a day (TID) | RESPIRATORY_TRACT | Status: DC | PRN

## 2016-02-02 MED ORDER — ONETOUCH DELICA LANCETS 33G MISC: Status: DC

## 2016-02-02 NOTE — Patient Instructions (Signed)

## 2016-02-02 NOTE — Progress Notes (Signed)
Subjective:    Patient ID: Kurt Blevins, male    DOB: 11/03/1960, 55 y.o.   MRN: [id number]  Patient in today for follow up of chronic medical problems. He is doing well today without complaints.   AT last visit blood sugar was elevated and HGBA1c was 6.9%- will repeat today. He does not check his blood sugars at home.  Hypertension This is a chronic problem. The current episode started more than 1 year ago. The problem is controlled. Pertinent negatives include no neck pain, palpitations or shortness of breath. Risk factors for coronary artery disease include dyslipidemia, male gender, obesity and sedentary lifestyle. Past treatments include ACE inhibitors and diuretics. The current treatment provides moderate improvement. Compliance problems include diet and exercise.   Hyperlipidemia This is a chronic problem. The current episode started more than 1 year ago. Recent lipid tests were reviewed and are variable. Factors aggravating his hyperlipidemia include thiazides. Pertinent negatives include no myalgias or shortness of breath. Current antihyperlipidemic treatment includes statins and fibric acid derivatives. The current treatment provides moderate improvement of lipids. Compliance problems include adherence to diet and adherence to exercise.  Risk factors for coronary artery disease include dyslipidemia, hypertension and obesity.  COPD Patient doing [place] and albuterol neb- Patient has not been using spirivia or symbicort as RX. Patient says that albuterol helps the most. Spirometry was done at last visit which was FEV1 75%. Spirivia and symbicort are to expensive and has not been getting. Says that the weather has really made him SOB and cough. Unable  To do much of anything due to breathing difficulty.  Review of Systems  Constitutional: Negative.   HENT: Negative.   Respiratory: Positive for cough and wheezing. Negative for shortness of breath.   Cardiovascular: Negative for  palpitations.  Gastrointestinal: Negative.   Genitourinary: Negative.   Musculoskeletal: Negative for myalgias and neck pain.  Neurological: Negative.   Psychiatric/Behavioral: Negative.   All other systems reviewed and are negative.      Objective:   Physical Exam  Constitutional: He is oriented to person, place, and time. He appears well-developed and well-nourished. No distress.  HENT:  Head: Normocephalic.  Right Ear: External ear normal.  Left Ear: External ear normal.  Nose: Nose normal.  Mouth/Throat: Oropharynx is clear and moist.  Eyes: Conjunctivae and EOM are normal. Pupils are equal, round, and reactive to light.  Neck: Normal range of motion. Neck supple. No JVD present. No thyromegaly present.  Cardiovascular: Normal rate, regular rhythm, normal heart sounds and intact distal pulses.  Exam reveals no gallop and no friction rub.   No murmur heard. Pulmonary/Chest: Effort normal. No respiratory distress. He has wheezes (insp and exp throughout). He has no rales. He exhibits no tenderness.  Abdominal: Soft. Bowel sounds are normal. He exhibits no mass. There is no tenderness.  Genitourinary: Penis normal.  Refuses prostate exam   Musculoskeletal: Normal range of motion. He exhibits no edema.  Lymphadenopathy:    He has no cervical adenopathy.  Neurological: He is alert and oriented to person, place, and time. No cranial nerve deficit.  Skin: Skin is warm and dry.  Psychiatric: He has a normal mood and affect. His behavior is normal. Judgment and thought content normal.    BP 145/88 mmHg  Pulse 62  Temp(Src) 98.6 F (37 C) (Oral)  Ht _0  (1.854 m)  Wt 305 lb (138.347 kg)  BMI 40.25 kg/m2  SpO2 97%  hgba1c 8.2%  Status post xopenex breathing treatment- looser  expiratory wheezes troughout-Mary-Margaret Hassell Done, FNP     Assessment & Plan:   1. Essential hypertension Do not add salt to diet - CMP14+EGFR - lisinopril-hydrochlorothiazide  (PRINZIDE,ZESTORETIC) 20-25 MG tablet; Take 1 tablet by mouth daily.  Dispense: 30 tablet; Refill: 5  2. Hyperlipidemia Low fat diet - Lipid panel - fenofibrate 160 MG tablet; Take 1 tablet (160 mg total) by mouth daily.  Dispense: 30 tablet; Refill: 5 - atorvastatin (LIPITOR) 40 MG tablet; Take 1 tablet (40 mg total) by mouth daily.  Dispense: 30 tablet; Refill: 5  3. Elevated blood sugar New diabetic - Bayer DCA Hb A1c Waived  4. Type 2 diabetes mellitus without complication, without long-term current use of insulin (H[facility]) Carb counting Showed how to use glucometer Check blood sugar every moring prior to eating and keep diary - metFORMIN (GLUCOPHAGE) 500 MG tablet; Take 1 tablet (500 mg total) by mouth 2 (two) times daily with a meal.  Dispense: 60 tablet; Refill: 5  5. Morbid obesity, unspecified obesity type ([place]) Discussed diet and exercise for person with BMI >25 Will recheck weight in 3-6 months  6. COPD with asthma ([place]) - ipratropium-albuterol (DUONEB) 0.5-2.5 (3) MG/3ML SOLN; Take 3 mLs by nebulization 3 (three) times daily as needed.  Dispense: 180 mL; Refill: 3 - levalbuterol (XOPENEX) nebulizer solution 1.25 mg; Take 1.25 mg by nebulization now.    Labs pending Health maintenance reviewed Diet and exercise encouraged Continue all meds Follow up  In 3 months   [place], FNP

## 2016-02-03 LAB — CMP14+EGFR
A/G RATIO: 2.1 (ref 1.2–2.2)
ALK PHOS: 66 IU/L (ref 39–117)
ALT: 29 IU/L (ref 0–44)
AST: 25 IU/L (ref 0–40)
Albumin: 4.1 g/dL (ref 3.5–5.5)
BILIRUBIN TOTAL: 0.2 mg/dL (ref 0.0–1.2)
BUN/Creatinine Ratio: 18 (ref 9–20)
BUN: 20 mg/dL (ref 6–24)
CHLORIDE: 99 mmol/L (ref 96–106)
CO2: 26 mmol/L (ref 18–29)
Calcium: 9.1 mg/dL (ref 8.7–10.2)
Creatinine, Ser: 1.12 mg/dL (ref 0.76–1.27)
GFR calc Af Amer: 86 mL/min/{1.73_m2} (ref >59)
GFR, EST NON AFRICAN AMERICAN: 74 mL/min/{1.73_m2} (ref >59)
GLOBULIN, TOTAL: 2 g/dL (ref 1.5–4.5)
GLUCOSE: 176 mg/dL — AB (ref 65–99)
POTASSIUM: 4.4 mmol/L (ref 3.5–5.2)
SODIUM: 141 mmol/L (ref 134–144)
Total Protein: 6.1 g/dL (ref 6.0–8.5)

## 2016-02-03 LAB — LIPID PANEL
CHOL/HDL RATIO: 4.3 ratio (ref 0.0–5.0)
Cholesterol, Total: 120 mg/dL (ref 100–199)
HDL: 28 mg/dL — ABNORMAL LOW (ref >39)
LDL Calculated: 19 mg/dL (ref 0–99)
TRIGLYCERIDES: 364 mg/dL — AB (ref 0–149)
VLDL Cholesterol Cal: 73 mg/dL — ABNORMAL HIGH (ref 5–40)

## 2016-05-07 ENCOUNTER — Telehealth: Payer: Self-pay | Admitting: Nurse Practitioner

## 2016-05-30 ENCOUNTER — Encounter: Payer: Self-pay | Admitting: Physician Assistant

## 2016-05-30 ENCOUNTER — Ambulatory Visit (INDEPENDENT_AMBULATORY_CARE_PROVIDER_SITE_OTHER): Payer: Medicare Other | Admitting: Physician Assistant

## 2016-05-30 VITALS — BP 132/79 | HR 67 | Temp 97.2°F | Resp 22 | Ht 73.0 in | Wt 312.0 lb

## 2016-05-30 DIAGNOSIS — J209 Acute bronchitis, unspecified: Secondary | ICD-10-CM

## 2016-05-30 MED ORDER — DOXYCYCLINE HYCLATE 100 MG PO TABS: 100.0000 mg | ORAL_TABLET | Freq: Two times a day (BID) | ORAL | 0 refills | Status: DC

## 2016-05-30 MED ORDER — METHYLPREDNISOLONE ACETATE 80 MG/ML IJ SUSP
80.0000 mg | Freq: Once | INTRAMUSCULAR | Status: AC
  Administered 2016-05-30: 80 mg via INTRAMUSCULAR

## 2016-05-30 NOTE — Progress Notes (Signed)
BP 132/79   Pulse 67   Temp 97.2 F (36.2 C) (Oral)   Resp (!) 22   Ht 6\' 1"  (1.854 m)   Wt (!) 312 lb (141.5 kg)   SpO2 95%   BMI 41.16 kg/m    Subjective:    Patient ID: Kurt Blevins, male    DOB: 1961-07-21, 55 y.o.   MRN: [id number]  HPI: Kurt Blevins is a 55 y.o. male presenting on 05/30/2016 for Cough and Wheezing  He has been sick for one week. His wife was sick before him. Has copious drainage and coughing.  Mucus is brown, denies blood.  Some fever and chills. Head pressure and ear fullness.  Relevant past medical, surgical, family and social history reviewed and updated as indicated. Allergies and medications reviewed and updated.  Past Medical History:  Diagnosis Date  . COPD (chronic obstructive pulmonary disease) ([place])   . Hyperlipidemia   . Hypertension     Past Surgical History:  Procedure Laterality Date  . BACK SURGERY  15 yrs ago  . HERNIA REPAIR    . NECK SURGERY    . TONSILLECTOMY      Review of Systems  Constitutional: Positive for fatigue. Negative for appetite change and fever.  HENT: Positive for postnasal drip, sinus pressure and sore throat. Negative for sneezing.   Eyes: Negative.  Negative for pain and visual disturbance.  Respiratory: Positive for shortness of breath and wheezing. Negative for cough, choking and chest tightness.   Cardiovascular: Negative.  Negative for chest pain, palpitations and leg swelling.  Gastrointestinal: Negative.  Negative for abdominal pain, diarrhea, nausea and vomiting.  Endocrine: Negative.   Genitourinary: Negative.   Musculoskeletal: Positive for back pain and myalgias.  Skin: Negative.  Negative for color change and rash.  Neurological: Positive for headaches. Negative for weakness and numbness.  Psychiatric/Behavioral: Negative.       Medication List       Accurate as of 05/30/16  2:55 PM. Always use your most recent med list.          aspirin 81 MG tablet Take 81 mg by mouth daily.     atorvastatin 40 MG tablet Commonly known as:  LIPITOR Take 1 tablet (40 mg total) by mouth daily.   doxycycline 100 MG tablet Commonly known as:  VIBRA-TABS Take 1 tablet (100 mg total) by mouth 2 (two) times daily.   fenofibrate 160 MG tablet Take 1 tablet (160 mg total) by mouth daily.   glucose blood test strip Commonly known as:  ONETOUCH VERIO Test blood sugar 1x per day and PRN  e11.9   ipratropium-albuterol 0.5-2.5 (3) MG/3ML Soln Commonly known as:  DUONEB Take 3 mLs by nebulization 3 (three) times daily as needed.   lisinopril-hydrochlorothiazide 20-25 MG tablet Commonly known as:  PRINZIDE,ZESTORETIC Take 1 tablet by mouth daily.   metFORMIN 500 MG tablet Commonly known as:  GLUCOPHAGE Take 1 tablet (500 mg total) by mouth 2 (two) times daily with a meal.   ONETOUCH DELICA LANCETS 99991111 Misc Check blood sugar 1x per day and prn  E 11.9          Objective:    BP 132/79   Pulse 67   Temp 97.2 F (36.2 C) (Oral)   Resp (!) 22   Ht 6\' 1"  (1.854 m)   Wt (!) 312 lb (141.5 kg)   SpO2 95%   BMI 41.16 kg/m   Allergies  Allergen Reactions  . Penicillins  Physical Exam  Constitutional: He appears well-developed and well-nourished.  HENT:  Head: Normocephalic and atraumatic.  Right Ear: Hearing and tympanic membrane normal.  Left Ear: Hearing and tympanic membrane normal.  Nose: Mucosal edema and sinus tenderness present. No nasal deformity. Right sinus exhibits frontal sinus tenderness. Left sinus exhibits frontal sinus tenderness.  Mouth/Throat: Posterior oropharyngeal erythema present.  Eyes: Conjunctivae and EOM are normal. Pupils are equal, round, and reactive to light. Right eye exhibits no discharge. Left eye exhibits no discharge.  Neck: Normal range of motion. Neck supple.  Cardiovascular: Normal rate, regular rhythm and normal heart sounds.   Pulmonary/Chest: Effort normal. No respiratory distress. He has no decreased breath sounds. He has  wheezes. He has no rhonchi. He has no rales.  Abdominal: Soft. Bowel sounds are normal.  Musculoskeletal: Normal range of motion.  Skin: Skin is warm and dry.        Assessment & Plan:   1. Acute bronchitis, unspecified organism - methylPREDNISolone acetate (DEPO-MEDROL) injection 80 mg; Inject 1 mL (80 mg total) into the muscle once. - doxycycline (VIBRA-TABS) 100 MG tablet; Take 1 tablet (100 mg total) by mouth 2 (two) times daily.  Dispense: 20 tablet; Refill: 0   Continue all other maintenance medications as listed above.  Follow up plan: Return if symptoms worsen or fail to improve.   Educational handout given for bronchitis  Terald Sleeper PA-C [facility] [address]  [place], [place] [address] [phone]   05/30/2016, 2:55 PM

## 2016-05-30 NOTE — Patient Instructions (Signed)

## 2016-06-28 ENCOUNTER — Ambulatory Visit (INDEPENDENT_AMBULATORY_CARE_PROVIDER_SITE_OTHER): Payer: Medicare Other | Admitting: Family Medicine

## 2016-06-28 ENCOUNTER — Encounter: Payer: Self-pay | Admitting: Family Medicine

## 2016-06-28 VITALS — BP 135/81 | HR 92 | Temp 98.1°F | Ht 73.0 in | Wt 309.4 lb

## 2016-06-28 DIAGNOSIS — J441 Chronic obstructive pulmonary disease with (acute) exacerbation: Secondary | ICD-10-CM | POA: Diagnosis not present

## 2016-06-28 MED ORDER — PREDNISONE 20 MG PO TABS: ORAL_TABLET | ORAL | 0 refills | Status: DC

## 2016-06-28 MED ORDER — BUDESONIDE-FORMOTEROL FUMARATE 160-4.5 MCG/ACT IN AERO: 2.0000 | INHALATION_SPRAY | Freq: Two times a day (BID) | RESPIRATORY_TRACT | 3 refills | Status: DC

## 2016-06-28 MED ORDER — METHYLPREDNISOLONE ACETATE 80 MG/ML IJ SUSP
80.0000 mg | Freq: Once | INTRAMUSCULAR | Status: AC
  Administered 2016-06-28: 80 mg via INTRAMUSCULAR

## 2016-06-28 MED ORDER — BUDESONIDE 1 MG/2ML IN SUSP: 1.0000 mg | Freq: Every day | RESPIRATORY_TRACT | 12 refills | Status: DC

## 2016-06-28 MED ORDER — AZITHROMYCIN 250 MG PO TABS: ORAL_TABLET | ORAL | 0 refills | Status: DC

## 2016-06-28 NOTE — Progress Notes (Signed)
BP 135/81   Pulse 92   Temp 98.1 F (36.7 C) (Oral)   Ht 6\' 1"  (1.854 m)   Wt (!) 309 lb 6 oz (140.3 kg)   SpO2 96%   BMI 40.82 kg/m    Subjective:    Patient ID: Kurt Blevins, male    DOB: May 10, 1961, 55 y.o.   MRN: [id number]  HPI: Kurt Blevins is a 55 y.o. male presenting on 06/28/2016 for Cough; COPD (wheezing); and Sinusitis (nasal congestion and pressure, headache)   HPI Cough and wheezing and sinus congestion Patient has been having cough and wheezing and sinus congestion isn't going on for the past few days. He does have known COPD and gets like this about 4 or 5 times out of year. He has been spoken to about maintenance inhalers but he has not been able to afford them to this point. He denies any fevers or chills. His wheezing has been significantly keeping him up at night. He has been having to use his nebulizer 2-3 times a day to help with this. He has been short of breath and wheezing significantly his cough is been nonproductive.  Relevant past medical, surgical, family and social history reviewed and updated as indicated. Interim medical history since our last visit reviewed. Allergies and medications reviewed and updated.  Review of Systems  Constitutional: Positive for fever. Negative for chills.  HENT: Positive for congestion, postnasal drip, rhinorrhea, sinus pressure and sore throat. Negative for ear discharge, ear pain, sneezing and voice change.   Eyes: Negative for pain, discharge, redness and visual disturbance.  Respiratory: Positive for cough, chest tightness, shortness of breath and wheezing.   Cardiovascular: Negative for chest pain and leg swelling.  Musculoskeletal: Negative for gait problem.  Skin: Negative for rash.  All other systems reviewed and are negative.   Per HPI unless specifically indicated above      Objective:    BP 135/81   Pulse 92   Temp 98.1 F (36.7 C) (Oral)   Ht 6\' 1"  (1.854 m)   Wt (!) 309 lb 6 oz (140.3 kg)    SpO2 96%   BMI 40.82 kg/m   Wt Readings from Last 3 Encounters:  06/28/16 (!) 309 lb 6 oz (140.3 kg)  05/30/16 (!) 312 lb (141.5 kg)  02/02/16 (!) 305 lb (138.3 kg)    Physical Exam  Constitutional: He is oriented to person, place, and time. He appears well-developed and well-nourished. No distress.  HENT:  Right Ear: Tympanic membrane, external ear and ear canal normal.  Left Ear: Tympanic membrane, external ear and ear canal normal.  Nose: Mucosal edema and rhinorrhea present. No sinus tenderness. No epistaxis. Right sinus exhibits maxillary sinus tenderness. Right sinus exhibits no frontal sinus tenderness. Left sinus exhibits maxillary sinus tenderness. Left sinus exhibits no frontal sinus tenderness.  Mouth/Throat: Uvula is midline and mucous membranes are normal. Posterior oropharyngeal edema and posterior oropharyngeal erythema present. No oropharyngeal exudate or tonsillar abscesses.  Eyes: Conjunctivae are normal. Right eye exhibits no discharge. Left eye exhibits no discharge. No scleral icterus.  Neck: Neck supple. No thyromegaly present.  Cardiovascular: Normal rate, regular rhythm, normal heart sounds and intact distal pulses.   No murmur heard. Pulmonary/Chest: Effort normal. No respiratory distress. He has wheezes. He has no rales.  Musculoskeletal: Normal range of motion. He exhibits no edema.  Lymphadenopathy:    He has no cervical adenopathy.  Neurological: He is alert and oriented to person, place, and time. Coordination  normal.  Skin: Skin is warm and dry. No rash noted. He is not diaphoretic.  Psychiatric: He has a normal mood and affect. His behavior is normal.  Nursing note and vitals reviewed.     Assessment & Plan:   Problem List Items Addressed This Visit    None    Visit Diagnoses    COPD exacerbation ([place])    -  Primary   Relevant Medications   budesonide-formoterol (SYMBICORT) 160-4.5 MCG/ACT inhaler   predniSONE (DELTASONE) 20 MG tablet    azithromycin (ZITHROMAX) 250 MG tablet   methylPREDNISolone acetate (DEPO-MEDROL) injection 80 mg   budesonide (PULMICORT) 1 MG/2ML nebulizer solution       Follow up plan: Return if symptoms worsen or fail to improve.  Counseling provided for all of the vaccine components No orders of the defined types were placed in this encounter.   Caryl Pina, MD [facility] 06/28/2016, 9:00 AM

## 2016-08-20 ENCOUNTER — Other Ambulatory Visit: Payer: Self-pay | Admitting: Nurse Practitioner

## 2016-08-20 DIAGNOSIS — E785 Hyperlipidemia, unspecified: Secondary | ICD-10-CM

## 2016-08-20 DIAGNOSIS — I10 Essential (primary) hypertension: Secondary | ICD-10-CM

## 2016-08-20 NOTE — Telephone Encounter (Signed)
Appt made for Thurs 08/23/16

## 2016-08-20 NOTE — Telephone Encounter (Signed)
Holding of on refills. Appt made for Thursday d/t needing Diabetic followup

## 2016-08-23 ENCOUNTER — Encounter: Payer: Self-pay | Admitting: Nurse Practitioner

## 2016-08-23 ENCOUNTER — Ambulatory Visit (INDEPENDENT_AMBULATORY_CARE_PROVIDER_SITE_OTHER): Payer: Medicare Other | Admitting: Nurse Practitioner

## 2016-08-23 VITALS — BP 140/82 | HR 68 | Temp 96.9°F | Ht 73.0 in | Wt 308.0 lb

## 2016-08-23 DIAGNOSIS — E119 Type 2 diabetes mellitus without complications: Secondary | ICD-10-CM

## 2016-08-23 DIAGNOSIS — J449 Chronic obstructive pulmonary disease, unspecified: Secondary | ICD-10-CM

## 2016-08-23 DIAGNOSIS — E785 Hyperlipidemia, unspecified: Secondary | ICD-10-CM

## 2016-08-23 DIAGNOSIS — E782 Mixed hyperlipidemia: Secondary | ICD-10-CM

## 2016-08-23 DIAGNOSIS — I10 Essential (primary) hypertension: Secondary | ICD-10-CM | POA: Diagnosis not present

## 2016-08-23 LAB — BAYER DCA HB A1C WAIVED: HB A1C (BAYER DCA - WAIVED): 8.1 % — ABNORMAL HIGH (ref <7.0)

## 2016-08-23 MED ORDER — METFORMIN HCL 500 MG PO TABS: 500.0000 mg | ORAL_TABLET | Freq: Two times a day (BID) | ORAL | 5 refills | Status: DC

## 2016-08-23 MED ORDER — LISINOPRIL-HYDROCHLOROTHIAZIDE 20-25 MG PO TABS: 1.0000 | ORAL_TABLET | Freq: Every day | ORAL | 5 refills | Status: DC

## 2016-08-23 MED ORDER — ATORVASTATIN CALCIUM 40 MG PO TABS: 40.0000 mg | ORAL_TABLET | Freq: Every day | ORAL | 5 refills | Status: DC

## 2016-08-23 MED ORDER — GLIPIZIDE 5 MG PO TABS: 5.0000 mg | ORAL_TABLET | Freq: Two times a day (BID) | ORAL | 5 refills | Status: DC

## 2016-08-23 MED ORDER — FENOFIBRATE 160 MG PO TABS
160.0000 mg | ORAL_TABLET | Freq: Every day | ORAL | 5 refills | Status: DC
Start: 2016-08-23 — End: 2016-12-24

## 2016-08-23 MED ORDER — UMECLIDINIUM-VILANTEROL 62.5-25 MCG/INH IN AEPB: 1.0000 | INHALATION_SPRAY | Freq: Every day | RESPIRATORY_TRACT | 0 refills | Status: DC

## 2016-08-23 NOTE — Patient Instructions (Signed)
Chronic Obstructive Pulmonary Disease Chronic obstructive pulmonary disease (COPD) is a common lung problem. In COPD, the flow of air from the lungs is limited. The way your lungs work will probably never return to normal, but there are things you can do to improve your lungs and make yourself feel better. Your doctor may treat your condition with:  Medicines.  Oxygen.  Lung surgery.  Changes to your diet.  Rehabilitation. This may involve a team of specialists. Follow these instructions at home:  Take all medicines as told by your doctor.  Avoid medicines or cough syrups that dry up your airway (such as antihistamines) and do not allow you to get rid of thick spit. You do not need to avoid them if told differently by your doctor.  If you smoke, stop. Smoking makes the problem worse.  Avoid being around things that make your breathing worse (like smoke, chemicals, and fumes).  Use oxygen therapy and therapy to help improve your lungs (pulmonary rehabilitation) if told by your doctor. If you need home oxygen therapy, ask your doctor if you should buy a tool to measure your oxygen level (oximeter).  Avoid people who have a sickness you can catch (contagious).  Avoid going outside when it is very hot, cold, or humid.  Eat healthy foods. Eat smaller meals more often. Rest before meals.  Stay active, but remember to also rest.  Make sure to get all the shots (vaccines) your doctor recommends. Ask your doctor if you need a pneumonia shot.  Learn and use tips on how to relax.  Learn and use tips on how to control your breathing as told by your doctor. Try: 1. Breathing in (inhaling) through your nose for 1 second. Then, pucker your lips and breath out (exhale) through your lips for 2 seconds. 2. Putting one hand on your belly (abdomen). Breathe in slowly through your nose for 1 second. Your hand on your belly should move out. Pucker your lips and breathe out slowly through your lips.  Your hand on your belly should move in as you breathe out.  Learn and use controlled coughing to clear thick spit from your lungs. The steps are: 1. Lean your head a little forward. 2. Breathe in deeply. 3. Try to hold your breath for 3 seconds. 4. Keep your mouth slightly open while coughing 2 times. 5. Spit any thick spit out into a tissue. 6. Rest and do the steps again 1 or 2 times as needed. Contact a doctor if:  You cough up more thick spit than usual.  There is a change in the color or thickness of the spit.  It is harder to breathe than usual.  Your breathing is faster than usual. Get help right away if:  You have shortness of breath while resting.  You have shortness of breath that stops you from:  Being able to talk.  Doing normal activities.  You chest hurts for longer than 5 minutes.  Your skin color is more blue than usual.  Your pulse oximeter shows that you have low oxygen for longer than 5 minutes. This information is not intended to replace advice given to you by your health care provider. Make sure you discuss any questions you have with your health care provider. Document Released: 12/26/2007 Document Revised: 12/15/2015 Document Reviewed: 03/05/2013 Elsevier Interactive Patient Education  2017 [employer] Inc.  

## 2016-08-23 NOTE — Progress Notes (Signed)
Subjective:    Patient ID: Kurt Blevins, male    DOB: 20-Aug-1960, 56 y.o.   MRN: [id number]  Patient here today for follow up of chronic medical problems.  Outpatient Encounter Prescriptions as of 08/23/2016  Medication Sig  . aspirin 81 MG tablet Take 81 mg by mouth daily.  Marland Kitchen atorvastatin (LIPITOR) 40 MG tablet TAKE 1 TABLET ONCE DAILY.  . budesonide (PULMICORT) 1 MG/2ML nebulizer solution Take 2 mLs (1 mg total) by nebulization daily.  . budesonide-formoterol (SYMBICORT) 160-4.5 MCG/ACT inhaler Inhale 2 puffs into the lungs 2 (two) times daily.  . fenofibrate 160 MG tablet TAKE 1 TABLET ONCE DAILY.  Marland Kitchen glucose blood (ONETOUCH VERIO) test strip Test blood sugar 1x per day and PRN  e11.9  . ipratropium-albuterol (DUONEB) 0.5-2.5 (3) MG/3ML SOLN Take 3 mLs by nebulization 3 (three) times daily as needed.  Marland Kitchen lisinopril-hydrochlorothiazide (PRINZIDE,ZESTORETIC) 20-25 MG tablet TAKE 1 TABLET ONCE DAILY.  . metFORMIN (GLUCOPHAGE) 500 MG tablet Take 1 tablet (500 mg total) by mouth 2 (two) times daily with a meal.  . ONETOUCH DELICA LANCETS 40J MISC Check blood sugar 1x per day and prn  E 11.9     Hypertension  This is a chronic problem. The current episode started more than 1 year ago. The problem is controlled. Pertinent negatives include no chest pain, neck pain, palpitations or shortness of breath. Risk factors for coronary artery disease include dyslipidemia, male gender, obesity and sedentary lifestyle. Past treatments include ACE inhibitors and diuretics. The current treatment provides moderate improvement. Compliance problems include diet and exercise.  There is no history of PVD or retinopathy.  Hyperlipidemia  This is a chronic problem. The current episode started more than 1 year ago. Recent lipid tests were reviewed and are variable. Factors aggravating his hyperlipidemia include thiazides. Pertinent negatives include no chest pain, myalgias or shortness of breath. Current  antihyperlipidemic treatment includes statins and fibric acid derivatives. The current treatment provides moderate improvement of lipids. Compliance problems include adherence to diet and adherence to exercise.  Risk factors for coronary artery disease include dyslipidemia, hypertension and obesity.  Diabetes  He presents for his follow-up diabetic visit. He has type 2 diabetes mellitus. No MedicAlert identification noted. His disease course has been fluctuating. There are no hypoglycemic associated symptoms. Pertinent negatives for diabetes include no chest pain, no fatigue, no polydipsia, no polyphagia, no visual change and no weakness. There are no hypoglycemic complications. Pertinent negatives for diabetic complications include no autonomic neuropathy, PVD or retinopathy. Risk factors for coronary artery disease include dyslipidemia, hypertension, male sex and obesity. Current diabetic treatment includes oral agent (monotherapy). He is following a diabetic diet. Diabetic meal planning: does not watch diet. He has not had a previous visit with a dietitian. He never participates in exercise. His breakfast blood glucose range is generally 180-200 mg/dl. His overall blood glucose range is 140-180 mg/dl. (Does not check blood sugars daily) He does not see a podiatrist.Eye exam is not current.  COPD Not using any inhalers right now. Says spirivia and symbicort are too expensive. He only uses his nebulizer when SOB- 1-2 x a week.   Review of Systems  Constitutional: Negative.  Negative for fatigue.  HENT: Negative.   Respiratory: Positive for cough and wheezing. Negative for shortness of breath.   Cardiovascular: Negative for chest pain and palpitations.  Gastrointestinal: Negative.   Endocrine: Negative for polydipsia and polyphagia.  Genitourinary: Negative.   Musculoskeletal: Negative for myalgias and neck pain.  Neurological: Negative.  Negative for weakness.  Psychiatric/Behavioral: Negative.    All other systems reviewed and are negative.      Objective:   Physical Exam  Constitutional: He is oriented to person, place, and time. He appears well-developed and well-nourished. No distress.  HENT:  Head: Normocephalic.  Right Ear: External ear normal.  Left Ear: External ear normal.  Nose: Nose normal.  Mouth/Throat: Oropharynx is clear and moist.  Eyes: Conjunctivae and EOM are normal. Pupils are equal, round, and reactive to light.  Neck: Normal range of motion. Neck supple. No JVD present. No thyromegaly present.  Cardiovascular: Normal rate, regular rhythm, normal heart sounds and intact distal pulses.  Exam reveals no gallop and no friction rub.   No murmur heard. Pulmonary/Chest: Effort normal. No respiratory distress. He has wheezes (insp and exp throughout). He has no rales. He exhibits no tenderness.  Abdominal: Soft. Bowel sounds are normal. He exhibits no mass. There is no tenderness.  Genitourinary: Penis normal.  Genitourinary Comments: Refuses prostate exam   Musculoskeletal: Normal range of motion. He exhibits no edema.  Lymphadenopathy:    He has no cervical adenopathy.  Neurological: He is alert and oriented to person, place, and time. No cranial nerve deficit.  Skin: Skin is warm and dry.  Psychiatric: He has a normal mood and affect. His behavior is normal. Judgment and thought content normal.    BP 140/82   Pulse 68   Temp (!) 96.9 F (36.1 C) (Oral)   Ht '6\' 1"'  (1.854 m)   Wt (!) 308 lb (139.7 kg)   BMI 40.64 kg/m    hgba1c 8.1%     Assessment & Plan:  1. Essential hypertension Low sodium diet - CMP14+EGFR - lisinopril-hydrochlorothiazide (PRINZIDE,ZESTORETIC) 20-25 MG tablet; Take 1 tablet by mouth daily.  Dispense: 30 tablet; Refill: 5  2. Hyperlipidemia, unspecified hyperlipidemia type Low fat diet - Lipid panel - fenofibrate 160 MG tablet; Take 1 tablet (160 mg total) by mouth daily.  Dispense: 30 tablet; Refill: 5 - atorvastatin  (LIPITOR) 40 MG tablet; Take 1 tablet (40 mg total) by mouth daily.  Dispense: 30 tablet; Refill: 5  3. Type 2 diabetes mellitus without complication, without long-term current use of insulin (HCC) Stricter carb counting Added glipizide with hypoglycemia warning - Bayer DCA Hb A1c Waived - Microalbumin / creatinine urine ratio - metFORMIN (GLUCOPHAGE) 500 MG tablet; Take 1 tablet (500 mg total) by mouth 2 (two) times daily with a meal.  Dispense: 60 tablet; Refill: 5 - glipiZIDE (GLUCOTROL) 5 MG tablet; Take 1 tablet (5 mg total) by mouth 2 (two) times daily before a meal.  Dispense: 60 tablet; Refill: 5   4. COPD with asthma ([place]) Gave anoro samples- patient was asked to let me know when runs out - umeclidinium-vilanterol (ANORO ELLIPTA) 62.5-25 MCG/INH AEPB; Inhale 1 puff into the lungs daily.  Dispense: 4 each; Refill: 0  6. Severe obesity (BMI >= 40) (HCC) Discussed diet and exercise for person with BMI >25 Will recheck weight in 3-6 months    Labs pending Health maintenance reviewed Diet and exercise encouraged Continue all meds Follow up  In 3 months   [place], FNP

## 2016-08-24 LAB — CMP14+EGFR
A/G RATIO: 2.3 — AB (ref 1.2–2.2)
ALBUMIN: 4.5 g/dL (ref 3.5–5.5)
ALT: 37 IU/L (ref 0–44)
AST: 25 IU/L (ref 0–40)
Alkaline Phosphatase: 62 IU/L (ref 39–117)
BILIRUBIN TOTAL: 0.4 mg/dL (ref 0.0–1.2)
BUN / CREAT RATIO: 15 (ref 9–20)
BUN: 17 mg/dL (ref 6–24)
CALCIUM: 9.3 mg/dL (ref 8.7–10.2)
CHLORIDE: 96 mmol/L (ref 96–106)
CO2: 27 mmol/L (ref 18–29)
Creatinine, Ser: 1.12 mg/dL (ref 0.76–1.27)
GFR, EST AFRICAN AMERICAN: 86 mL/min/{1.73_m2} (ref >59)
GFR, EST NON AFRICAN AMERICAN: 74 mL/min/{1.73_m2} (ref >59)
GLOBULIN, TOTAL: 2 g/dL (ref 1.5–4.5)
Glucose: 134 mg/dL — ABNORMAL HIGH (ref 65–99)
POTASSIUM: 4.5 mmol/L (ref 3.5–5.2)
SODIUM: 140 mmol/L (ref 134–144)
TOTAL PROTEIN: 6.5 g/dL (ref 6.0–8.5)

## 2016-08-24 LAB — LIPID PANEL
Chol/HDL Ratio: 4.3 ratio units (ref 0.0–5.0)
Cholesterol, Total: 139 mg/dL (ref 100–199)
HDL: 32 mg/dL — AB (ref >39)
LDL Calculated: 60 mg/dL (ref 0–99)
Triglycerides: 237 mg/dL — ABNORMAL HIGH (ref 0–149)
VLDL Cholesterol Cal: 47 mg/dL — ABNORMAL HIGH (ref 5–40)

## 2016-12-24 ENCOUNTER — Ambulatory Visit (INDEPENDENT_AMBULATORY_CARE_PROVIDER_SITE_OTHER): Payer: Medicare Other | Admitting: Nurse Practitioner

## 2016-12-24 ENCOUNTER — Encounter: Payer: Self-pay | Admitting: Nurse Practitioner

## 2016-12-24 VITALS — BP 126/81 | HR 67 | Temp 97.1°F | Ht 73.0 in | Wt 327.0 lb

## 2016-12-24 DIAGNOSIS — E782 Mixed hyperlipidemia: Secondary | ICD-10-CM

## 2016-12-24 DIAGNOSIS — J449 Chronic obstructive pulmonary disease, unspecified: Secondary | ICD-10-CM

## 2016-12-24 DIAGNOSIS — I10 Essential (primary) hypertension: Secondary | ICD-10-CM

## 2016-12-24 DIAGNOSIS — E119 Type 2 diabetes mellitus without complications: Secondary | ICD-10-CM | POA: Diagnosis not present

## 2016-12-24 DIAGNOSIS — E785 Hyperlipidemia, unspecified: Secondary | ICD-10-CM | POA: Diagnosis not present

## 2016-12-24 LAB — BAYER DCA HB A1C WAIVED: HB A1C (BAYER DCA - WAIVED): 7.4 % — ABNORMAL HIGH (ref <7.0)

## 2016-12-24 MED ORDER — FENOFIBRATE 160 MG PO TABS: 160.0000 mg | ORAL_TABLET | Freq: Every day | ORAL | 5 refills | Status: DC

## 2016-12-24 MED ORDER — UMECLIDINIUM-VILANTEROL 62.5-25 MCG/INH IN AEPB: 1.0000 | INHALATION_SPRAY | Freq: Every day | RESPIRATORY_TRACT | 0 refills | Status: DC

## 2016-12-24 MED ORDER — GLIPIZIDE 5 MG PO TABS: 5.0000 mg | ORAL_TABLET | Freq: Two times a day (BID) | ORAL | 5 refills | Status: DC

## 2016-12-24 MED ORDER — ATORVASTATIN CALCIUM 40 MG PO TABS: 40.0000 mg | ORAL_TABLET | Freq: Every day | ORAL | 5 refills | Status: DC

## 2016-12-24 MED ORDER — METFORMIN HCL 500 MG PO TABS: 500.0000 mg | ORAL_TABLET | Freq: Two times a day (BID) | ORAL | 5 refills | Status: DC

## 2016-12-24 MED ORDER — LISINOPRIL-HYDROCHLOROTHIAZIDE 20-25 MG PO TABS: 1.0000 | ORAL_TABLET | Freq: Every day | ORAL | 5 refills | Status: DC

## 2016-12-24 NOTE — Patient Instructions (Signed)
Fat and Cholesterol Restricted Diet Getting too much fat and cholesterol in your diet may cause health problems. Following this diet helps keep your fat and cholesterol at normal levels. This can keep you from getting sick. What types of fat should I choose?  Choose monosaturated and polyunsaturated fats. These are found in foods such as olive oil, canola oil, flaxseeds, walnuts, almonds, and seeds.  Eat more omega-3 fats. Good choices include salmon, mackerel, sardines, tuna, flaxseed oil, and ground flaxseeds.  Limit saturated fats. These are in animal products such as meats, butter, and cream. They can also be in plant products such as palm oil, palm kernel oil, and coconut oil.  Avoid foods with partially hydrogenated oils in them. These contain trans fats. Examples of foods that have trans fats are stick margarine, some tub margarines, cookies, crackers, and other baked goods. What general guidelines do I need to follow?  Check food labels. Look for the words "trans fat" and "saturated fat."  When preparing a meal: ? Fill half of your plate with vegetables and green salads. ? Fill one fourth of your plate with whole grains. Look for the word "whole" as the first word in the ingredient list. ? Fill one fourth of your plate with lean protein foods.  Eat more foods that have fiber, like apples, carrots, beans, peas, and barley.  Eat more home-cooked foods. Eat less at restaurants and buffets.  Limit or avoid alcohol.  Limit foods high in starch and sugar.  Limit fried foods.  Cook foods without frying them. Baking, boiling, grilling, and broiling are all great options.  Lose weight if you are overweight. Losing even a small amount of weight can help your overall health. It can also help prevent diseases such as diabetes and heart disease. What foods can I eat? Grains Whole grains, such as whole wheat or whole grain breads, crackers, cereals, and pasta. Unsweetened oatmeal,  bulgur, barley, quinoa, or brown rice. Corn or whole wheat flour tortillas. Vegetables Fresh or frozen vegetables (raw, steamed, roasted, or grilled). Green salads. Fruits All fresh, canned (in natural juice), or frozen fruits. Meat and Other Protein Products Ground beef (85% or leaner), grass-fed beef, or beef trimmed of fat. Skinless chicken or turkey. Ground chicken or turkey. Pork trimmed of fat. All fish and seafood. Eggs. Dried beans, peas, or lentils. Unsalted nuts or seeds. Unsalted canned or dry beans. Dairy Low-fat dairy products, such as skim or 1% milk, 2% or reduced-fat cheeses, low-fat ricotta or cottage cheese, or plain low-fat yogurt. Fats and Oils Tub margarines without trans fats. Light or reduced-fat mayonnaise and salad dressings. Avocado. Olive, canola, sesame, or safflower oils. Natural peanut or almond butter (choose ones without added sugar and oil). The items listed above may not be a complete list of recommended foods or beverages. Contact your dietitian for more options. What foods are not recommended? Grains White bread. White pasta. White rice. Cornbread. Bagels, pastries, and croissants. Crackers that contain trans fat. Vegetables White potatoes. Corn. Creamed or fried vegetables. Vegetables in a cheese sauce. Fruits Dried fruits. Canned fruit in light or heavy syrup. Fruit juice. Meat and Other Protein Products Fatty cuts of meat. Ribs, chicken wings, bacon, sausage, bologna, salami, chitterlings, fatback, hot dogs, bratwurst, and packaged luncheon meats. Liver and organ meats. Dairy Whole or 2% milk, cream, half-and-half, and cream cheese. Whole milk cheeses. Whole-fat or sweetened yogurt. Full-fat cheeses. Nondairy creamers and whipped toppings. Processed cheese, cheese spreads, or cheese curds. Sweets and Desserts Corn   syrup, sugars, honey, and molasses. Candy. Jam and jelly. Syrup. Sweetened cereals. Cookies, pies, cakes, donuts, muffins, and ice  cream. Fats and Oils Butter, stick margarine, lard, shortening, ghee, or bacon fat. Coconut, palm kernel, or palm oils. Beverages Alcohol. Sweetened drinks (such as sodas, lemonade, and fruit drinks or punches). The items listed above may not be a complete list of foods and beverages to avoid. Contact your dietitian for more information. This information is not intended to replace advice given to you by your health care provider. Make sure you discuss any questions you have with your health care provider. Document Released: 01/08/2012 Document Revised: 03/15/2016 Document Reviewed: 10/08/2013 Elsevier Interactive Patient Education  2018 [employer]  

## 2016-12-24 NOTE — Progress Notes (Signed)
Subjective:    Patient ID: Kurt Blevins, male    DOB: 1961/07/02, 57 y.o.   MRN: [id number]  HPI  Kurt Blevins is here today for follow up of chronic medical problem.  Outpatient Encounter Prescriptions as of 12/24/2016  Medication Sig  . aspirin 81 MG tablet Take 81 mg by mouth daily.  Marland Kitchen atorvastatin (LIPITOR) 40 MG tablet Take 1 tablet (40 mg total) by mouth daily.  . fenofibrate 160 MG tablet Take 1 tablet (160 mg total) by mouth daily.  Marland Kitchen glipiZIDE (GLUCOTROL) 5 MG tablet Take 1 tablet (5 mg total) by mouth 2 (two) times daily before a meal.  . glucose blood (ONETOUCH VERIO) test strip Test blood sugar 1x per day and PRN  e11.9  . ipratropium-albuterol (DUONEB) 0.5-2.5 (3) MG/3ML SOLN Take 3 mLs by nebulization 3 (three) times daily as needed.  Marland Kitchen lisinopril-hydrochlorothiazide (PRINZIDE,ZESTORETIC) 20-25 MG tablet Take 1 tablet by mouth daily.  . metFORMIN (GLUCOPHAGE) 500 MG tablet Take 1 tablet (500 mg total) by mouth 2 (two) times daily with a meal.  . ONETOUCH DELICA LANCETS 49S MISC Check blood sugar 1x per day and prn  E 11.9  . umeclidinium-vilanterol (ANORO ELLIPTA) 62.5-25 MCG/INH AEPB Inhale 1 puff into the lungs daily.   No facility-administered encounter medications on file as of 12/24/2016.     1. Essential hypertension  No c/o chest pain,SOB or headache- does not check blood pressures at home  2. Hyperlipidemia, unspecified hyperlipidemia type  Taking lipitor daily- does not watch diet  3. Type 2 diabetes mellitus without complication, without long-term current use of insulin (HCC) last HGBa1c was 8.1 . Added glipizide to meds at last visit. Blood sugars have been ranging from 90-130. He has cut back on portion sizes  4. Severe obesity (BMI >= 40) (HCC)  Weight has gone up since last visit- 19lbs- patient does noit understand why because he has cut back on diet.  5. COPD with asthma ([place])  Always has SOB- but still smokes occasionally    New complaints: None  today     Review of Systems  Constitutional: Negative.  Negative for diaphoresis.  HENT: Negative.   Eyes: Negative for pain.  Respiratory: Positive for shortness of breath.   Cardiovascular: Negative for chest pain, palpitations and leg swelling.  Gastrointestinal: Negative for abdominal pain.  Endocrine: Negative for polydipsia.  Genitourinary: Negative.   Skin: Negative for rash.  Neurological: Negative for dizziness, weakness and headaches.  Hematological: Does not bruise/bleed easily.  All other systems reviewed and are negative.      Objective:   Physical Exam  Constitutional: He is oriented to person, place, and time. He appears well-developed and well-nourished. No distress.  HENT:  Head: Normocephalic.  Right Ear: External ear normal.  Left Ear: External ear normal.  Nose: Nose normal.  Mouth/Throat: Oropharynx is clear and moist.  Eyes: EOM are normal. Pupils are equal, round, and reactive to light.  Neck: Normal range of motion. Neck supple. No JVD present. No thyromegaly present.  Cardiovascular: Normal rate, regular rhythm, normal heart sounds and intact distal pulses.  Exam reveals no gallop and no friction rub.   No murmur heard. Pulmonary/Chest: Effort normal. No respiratory distress. He has wheezes (exp throughout). He has no rales. He exhibits no tenderness.  Diminished breath sound hroughout  Abdominal: Soft. Bowel sounds are normal. He exhibits no mass. There is no tenderness.  Musculoskeletal: Normal range of motion. He exhibits edema (mild edema bil lower  ext).  Lymphadenopathy:    He has no cervical adenopathy.  Neurological: He is alert and oriented to person, place, and time. No cranial nerve deficit.  Skin: Skin is warm and dry.  Psychiatric: He has a normal mood and affect. His behavior is normal. Judgment and thought content normal.   BP 126/81   Pulse 67   Temp 97.1 F (36.2 C) (Oral)   Ht '6\' 1"'  (1.854 m)   Wt (!) 327 lb (148.3 kg)   BMI  43.14 kg/m       Assessment & Plan:  1. Essential hypertension Low sodium diet - CMP14+EGFR - lisinopril-hydrochlorothiazide (PRINZIDE,ZESTORETIC) 20-25 MG tablet; Take 1 tablet by mouth daily.  Dispense: 30 tablet; Refill: 5  2. Hyperlipidemia, unspecified hyperlipidemia type Low fta diet - Lipid panel  3. Type 2 diabetes mellitus without complication, without long-term current use of insulin (HCC) Continue to watch carbs in diet Exercise as can tolerate - Bayer DCA Hb A1c Waived - Microalbumin / creatinine urine ratio - metFORMIN (GLUCOPHAGE) 500 MG tablet; Take 1 tablet (500 mg total) by mouth 2 (two) times daily with a meal.  Dispense: 60 tablet; Refill: 5 - glipiZIDE (GLUCOTROL) 5 MG tablet; Take 1 tablet (5 mg total) by mouth 2 (two) times daily before a meal.  Dispense: 60 tablet; Refill: 5  4. Severe obesity (BMI >= 40) (HCC) Discussed diet and exercise for person with BMI >25 Will recheck weight in 3-6 months  5. COPD with asthma ([place]) Smoking cessation encouraged - umeclidinium-vilanterol (ANORO ELLIPTA) 62.5-25 MCG/INH AEPB; Inhale 1 puff into the lungs daily.  Dispense: 4 each; Refill: 0  6. Mixed hyperlipidemia Low fat diet - atorvastatin (LIPITOR) 40 MG tablet; Take 1 tablet (40 mg total) by mouth daily.  Dispense: 30 tablet; Refill: 5 - fenofibrate 160 MG tablet; Take 1 tablet (160 mg total) by mouth daily.  Dispense: 30 tablet; Refill: 5    Labs pending Health maintenance reviewed Diet and exercise encouraged Continue all meds Follow up  In 3 months   [place], FNP

## 2016-12-25 LAB — CMP14+EGFR
ALBUMIN: 4.4 g/dL (ref 3.5–5.5)
ALK PHOS: 61 IU/L (ref 39–117)
ALT: 30 IU/L (ref 0–44)
AST: 26 IU/L (ref 0–40)
Albumin/Globulin Ratio: 2.3 — ABNORMAL HIGH (ref 1.2–2.2)
BILIRUBIN TOTAL: 0.3 mg/dL (ref 0.0–1.2)
BUN/Creatinine Ratio: 15 (ref 9–20)
BUN: 16 mg/dL (ref 6–24)
CHLORIDE: 100 mmol/L (ref 96–106)
CO2: 27 mmol/L (ref 18–29)
Calcium: 9.4 mg/dL (ref 8.7–10.2)
Creatinine, Ser: 1.09 mg/dL (ref 0.76–1.27)
GFR calc Af Amer: 88 mL/min/{1.73_m2} (ref >59)
GFR calc non Af Amer: 76 mL/min/{1.73_m2} (ref >59)
Globulin, Total: 1.9 g/dL (ref 1.5–4.5)
Glucose: 81 mg/dL (ref 65–99)
Potassium: 4.3 mmol/L (ref 3.5–5.2)
Sodium: 141 mmol/L (ref 134–144)
Total Protein: 6.3 g/dL (ref 6.0–8.5)

## 2016-12-25 LAB — LIPID PANEL
CHOLESTEROL TOTAL: 143 mg/dL (ref 100–199)
Chol/HDL Ratio: 4.3 ratio (ref 0.0–5.0)
HDL: 33 mg/dL — ABNORMAL LOW (ref >39)
LDL Calculated: 52 mg/dL (ref 0–99)
TRIGLYCERIDES: 292 mg/dL — AB (ref 0–149)
VLDL CHOLESTEROL CAL: 58 mg/dL — AB (ref 5–40)

## 2017-04-02 ENCOUNTER — Ambulatory Visit (INDEPENDENT_AMBULATORY_CARE_PROVIDER_SITE_OTHER): Payer: Medicare Other | Admitting: Nurse Practitioner

## 2017-04-02 ENCOUNTER — Encounter: Payer: Self-pay | Admitting: Nurse Practitioner

## 2017-04-02 VITALS — BP 137/96 | HR 126 | Ht 73.0 in | Wt 330.0 lb

## 2017-04-02 DIAGNOSIS — I10 Essential (primary) hypertension: Secondary | ICD-10-CM | POA: Diagnosis not present

## 2017-04-02 DIAGNOSIS — J449 Chronic obstructive pulmonary disease, unspecified: Secondary | ICD-10-CM | POA: Diagnosis not present

## 2017-04-02 DIAGNOSIS — E119 Type 2 diabetes mellitus without complications: Secondary | ICD-10-CM | POA: Diagnosis not present

## 2017-04-02 DIAGNOSIS — E782 Mixed hyperlipidemia: Secondary | ICD-10-CM | POA: Diagnosis not present

## 2017-04-02 DIAGNOSIS — E785 Hyperlipidemia, unspecified: Secondary | ICD-10-CM | POA: Diagnosis not present

## 2017-04-02 LAB — CMP14+EGFR
A/G RATIO: 2 (ref 1.2–2.2)
ALBUMIN: 4.3 g/dL (ref 3.5–5.5)
ALK PHOS: 68 IU/L (ref 39–117)
ALT: 28 IU/L (ref 0–44)
AST: 22 IU/L (ref 0–40)
BILIRUBIN TOTAL: 0.2 mg/dL (ref 0.0–1.2)
BUN / CREAT RATIO: 14 (ref 9–20)
BUN: 15 mg/dL (ref 6–24)
CHLORIDE: 99 mmol/L (ref 96–106)
CO2: 24 mmol/L (ref 20–29)
Calcium: 9.5 mg/dL (ref 8.7–10.2)
Creatinine, Ser: 1.07 mg/dL (ref 0.76–1.27)
GFR calc Af Amer: 90 mL/min/{1.73_m2} (ref >59)
GFR calc non Af Amer: 78 mL/min/{1.73_m2} (ref >59)
GLOBULIN, TOTAL: 2.1 g/dL (ref 1.5–4.5)
Glucose: 146 mg/dL — ABNORMAL HIGH (ref 65–99)
POTASSIUM: 4.1 mmol/L (ref 3.5–5.2)
SODIUM: 140 mmol/L (ref 134–144)
Total Protein: 6.4 g/dL (ref 6.0–8.5)

## 2017-04-02 LAB — LIPID PANEL
CHOL/HDL RATIO: 4 ratio (ref 0.0–5.0)
Cholesterol, Total: 120 mg/dL (ref 100–199)
HDL: 30 mg/dL — AB (ref >39)
LDL CALC: 31 mg/dL (ref 0–99)
Triglycerides: 296 mg/dL — ABNORMAL HIGH (ref 0–149)
VLDL CHOLESTEROL CAL: 59 mg/dL — AB (ref 5–40)

## 2017-04-02 LAB — BAYER DCA HB A1C WAIVED: HB A1C (BAYER DCA - WAIVED): 7.9 % — ABNORMAL HIGH (ref <7.0)

## 2017-04-02 MED ORDER — METFORMIN HCL 1000 MG PO TABS: 1000.0000 mg | ORAL_TABLET | Freq: Two times a day (BID) | ORAL | 1 refills | Status: DC

## 2017-04-02 MED ORDER — LISINOPRIL-HYDROCHLOROTHIAZIDE 20-25 MG PO TABS: 1.0000 | ORAL_TABLET | Freq: Every day | ORAL | 5 refills | Status: DC

## 2017-04-02 MED ORDER — GLIPIZIDE 5 MG PO TABS: 5.0000 mg | ORAL_TABLET | Freq: Two times a day (BID) | ORAL | 5 refills | Status: DC

## 2017-04-02 MED ORDER — UMECLIDINIUM-VILANTEROL 62.5-25 MCG/INH IN AEPB: 1.0000 | INHALATION_SPRAY | Freq: Every day | RESPIRATORY_TRACT | 0 refills | Status: DC

## 2017-04-02 MED ORDER — IPRATROPIUM-ALBUTEROL 0.5-2.5 (3) MG/3ML IN SOLN: 3.0000 mL | Freq: Three times a day (TID) | RESPIRATORY_TRACT | 3 refills | Status: DC | PRN

## 2017-04-02 MED ORDER — FENOFIBRATE 160 MG PO TABS: 160.0000 mg | ORAL_TABLET | Freq: Every day | ORAL | 5 refills | Status: DC

## 2017-04-02 MED ORDER — ATORVASTATIN CALCIUM 40 MG PO TABS: 40.0000 mg | ORAL_TABLET | Freq: Every day | ORAL | 5 refills | Status: DC

## 2017-04-02 NOTE — Patient Instructions (Signed)

## 2017-04-02 NOTE — Progress Notes (Signed)
Subjective:    Patient ID: Kurt Blevins, male    DOB: 1961-02-21, 56 y.o.   MRN: [id number]  HPI  Kurt Blevins is here today for follow up of chronic medical problem.  Outpatient Encounter Prescriptions as of 04/02/2017  Medication Sig  . aspirin 81 MG tablet Take 81 mg by mouth daily.  Marland Kitchen atorvastatin (LIPITOR) 40 MG tablet Take 1 tablet (40 mg total) by mouth daily.  . fenofibrate 160 MG tablet Take 1 tablet (160 mg total) by mouth daily.  Marland Kitchen glipiZIDE (GLUCOTROL) 5 MG tablet Take 1 tablet (5 mg total) by mouth 2 (two) times daily before a meal.  . glucose blood (ONETOUCH VERIO) test strip Test blood sugar 1x per day and PRN  e11.9  . ipratropium-albuterol (DUONEB) 0.5-2.5 (3) MG/3ML SOLN Take 3 mLs by nebulization 3 (three) times daily as needed.  Marland Kitchen lisinopril-hydrochlorothiazide (PRINZIDE,ZESTORETIC) 20-25 MG tablet Take 1 tablet by mouth daily.  . metFORMIN (GLUCOPHAGE) 500 MG tablet Take 1 tablet (500 mg total) by mouth 2 (two) times daily with a meal.  . ONETOUCH DELICA LANCETS 40J MISC Check blood sugar 1x per day and prn  E 11.9  . umeclidinium-vilanterol (ANORO ELLIPTA) 62.5-25 MCG/INH AEPB Inhale 1 puff into the lungs daily.   No facility-administered encounter medications on file as of 04/02/2017.     1. Essential hypertension  No c/o chest pain,sob or headache. Does not check blood pressure at home  2. Hyperlipidemia, unspecified hyperlipidemia type  Really does not watch diet  3. Type 2 diabetes mellitus without complication, without long-term current use of insulin (HCC) last HGBA1c was 7.4%. Blood sugars have been running around 120-150. No hypoglycemia  4. COPD with asthma ([place])  Uses anoro and duoneb- has been doing quite well as of late with breathing. Still tend sto wheeze often  5. Severe obesity (BMI >= 40) (HCC)  No recent weight changes.    New complaints: None today- he does have a piece of metal in his orm he would like to get cut out in the near  future  Social history: Wife in with him today    Review of Systems  Constitutional: Negative for activity change and appetite change.  HENT: Negative.   Eyes: Negative for pain.  Respiratory: Positive for shortness of breath (no worse then usual for him).   Cardiovascular: Negative for chest pain, palpitations and leg swelling.  Gastrointestinal: Negative for abdominal pain.  Endocrine: Negative for polydipsia.  Genitourinary: Negative.   Skin: Negative for rash.  Neurological: Negative for dizziness, weakness and headaches.  Hematological: Does not bruise/bleed easily.  Psychiatric/Behavioral: Negative.   All other systems reviewed and are negative.      Objective:   Physical Exam  Constitutional: He is oriented to person, place, and time. He appears well-developed and well-nourished.  HENT:  Head: Normocephalic.  Right Ear: External ear normal.  Left Ear: External ear normal.  Nose: Nose normal.  Mouth/Throat: Oropharynx is clear and moist.  Eyes: Pupils are equal, round, and reactive to light. EOM are normal.  Neck: Normal range of motion. Neck supple. No JVD present. No thyromegaly present.  Cardiovascular: Normal rate, regular rhythm, normal heart sounds and intact distal pulses.  Exam reveals no gallop and no friction rub.   No murmur heard. Pulmonary/Chest: Effort normal. No respiratory distress. He has wheezes (exp bil lower lobes). He has no rales. He exhibits no tenderness.  Abdominal: Soft. Bowel sounds are normal. He exhibits no mass. There  is no tenderness.  Genitourinary: Prostate normal and penis normal.  Musculoskeletal: Normal range of motion. He exhibits no edema.  Lymphadenopathy:    He has no cervical adenopathy.  Neurological: He is alert and oriented to person, place, and time. No cranial nerve deficit.  Skin: Skin is warm and dry.  Psychiatric: He has a normal mood and affect. His behavior is normal. Judgment and thought content normal.   BP (!)  137/96   Pulse (!) 126   Ht '6\' 1"'  (1.854 m)   Wt (!) 330 lb (149.7 kg)   SpO2 97%   BMI 43.54 kg/m   hgba1c 7.9%      Assessment & Plan:  1. Essential hypertension Low sodium diet - CMP14+EGFR - lisinopril-hydrochlorothiazide (PRINZIDE,ZESTORETIC) 20-25 MG tablet; Take 1 tablet by mouth daily.  Dispense: 30 tablet; Refill: 5  2. Type 2 diabetes mellitus without complication, without long-term current use of insulin (HCC) Stricter carb counting Increase metformin to 1043m bid - Bayer DCA Hb A1c Waived - metFORMIN (GLUCOPHAGE) 1000 MG tablet; Take 1 tablet (1,000 mg total) by mouth 2 (two) times daily with a meal.  Dispense: 180 tablet; Refill: 1 - glipiZIDE (GLUCOTROL) 5 MG tablet; Take 1 tablet (5 mg total) by mouth 2 (two) times daily before a meal.  Dispense: 60 tablet; Refill: 5  3. [place] Avoid cigarette smoke - ipratropium-albuterol (DUONEB) 0.5-2.5 (3) MG/3ML SOLN; Take 3 mLs by nebulization 3 (three) times daily as needed.  Dispense: 180 mL; Refill: 3 - umeclidinium-vilanterol (ANORO ELLIPTA) 62.5-25 MCG/INH AEPB; Inhale 1 puff into the lungs daily.  Dispense: 4 each; Refill: 0  4. Severe obesity (BMI >= 40) (HCC) Discussed diet and exercise for person with BMI >25 Will recheck weight in 3-6 months   5. Mixed hyperlipidemia Low fat diet - Lipid panel - atorvastatin (LIPITOR) 40 MG tablet; Take 1 tablet (40 mg total) by mouth daily.  Dispense: 30 tablet; Refill: 5 - fenofibrate 160 MG tablet; Take 1 tablet (160 mg total) by mouth daily.  Dispense: 30 tablet; Refill: 5    Labs pending Health maintenance reviewed Diet and exercise encouraged Continue all meds Follow up  In 3 months   [place] FNP

## 2017-04-03 ENCOUNTER — Other Ambulatory Visit: Payer: Self-pay | Admitting: Nurse Practitioner

## 2017-04-03 ENCOUNTER — Ambulatory Visit: Payer: Medicare Other | Admitting: Nurse Practitioner

## 2017-04-06 ENCOUNTER — Other Ambulatory Visit: Payer: Self-pay | Admitting: Nurse Practitioner

## 2017-04-06 DIAGNOSIS — E119 Type 2 diabetes mellitus without complications: Secondary | ICD-10-CM

## 2017-04-09 ENCOUNTER — Encounter: Payer: Self-pay | Admitting: Nurse Practitioner

## 2017-04-09 ENCOUNTER — Ambulatory Visit (INDEPENDENT_AMBULATORY_CARE_PROVIDER_SITE_OTHER): Payer: Medicare Other

## 2017-04-09 ENCOUNTER — Ambulatory Visit (INDEPENDENT_AMBULATORY_CARE_PROVIDER_SITE_OTHER): Payer: Medicare Other | Admitting: Nurse Practitioner

## 2017-04-09 VITALS — BP 143/85 | HR 60 | Temp 96.8°F | Ht 73.0 in | Wt 331.2 lb

## 2017-04-09 DIAGNOSIS — W458XXD Other foreign body or object entering through skin, subsequent encounter: Secondary | ICD-10-CM | POA: Diagnosis not present

## 2017-04-09 DIAGNOSIS — D2361 Other benign neoplasm of skin of right upper limb, including shoulder: Secondary | ICD-10-CM

## 2017-04-09 DIAGNOSIS — M79631 Pain in right forearm: Secondary | ICD-10-CM

## 2017-04-09 DIAGNOSIS — D367 Benign neoplasm of other specified sites: Secondary | ICD-10-CM

## 2017-04-09 NOTE — Patient Instructions (Signed)

## 2017-04-09 NOTE — Progress Notes (Signed)
   Subjective:    Patient ID: Kurt Blevins, male    DOB: 07-04-1961, 56 y.o.   MRN: [id number]  HPI patient comes in today to have a piece of metal removed form right forearm. It has been there for sometime and has recently started bothering him. Wants it removed.    Review of Systems  Respiratory: Negative.   Cardiovascular: Negative.   Neurological: Negative.   Psychiatric/Behavioral: Negative.   All other systems reviewed and are negative.      Objective:   Physical Exam  Constitutional: He is oriented to person, place, and time. He appears well-developed and well-nourished.  Cardiovascular: Normal rate and regular rhythm.   Pulmonary/Chest: Effort normal.  Neurological: He is alert and oriented to person, place, and time.  Skin: Skin is warm.  Palpable foreign body in right forearm  Psychiatric: He has a normal mood and affect. His behavior is normal. Judgment and thought content normal.   BP (!) 143/85   Pulse 60   Temp (!) 96.8 F (36 C)   Ht 6\' 1"  (1.854 m)   Wt (!) 331 lb 3.2 oz (150.2 kg)   SpO2 95%   BMI 43.70 kg/m   Procedure:  Lidocaine 1% with epi 38ml local  Cleaned with betadine  #11 blade to make incision  Small cyst removed- no foreign body found  Cleaned with NACl  Pressure dressing applied         Assessment & Plan:   1. Other foreign body or object entering through skin, subsequent encounter   2. Cyst, dermoid, arm, right    Watch for signs of infection Follow up prn  Mary-Margaret Hassell Done, FNP '

## 2017-06-03 ENCOUNTER — Ambulatory Visit: Payer: Medicare Other | Admitting: *Deleted

## 2017-06-10 ENCOUNTER — Encounter: Payer: Self-pay | Admitting: *Deleted

## 2017-06-10 ENCOUNTER — Ambulatory Visit (INDEPENDENT_AMBULATORY_CARE_PROVIDER_SITE_OTHER): Payer: Medicare Other | Admitting: *Deleted

## 2017-06-10 VITALS — BP 132/83 | HR 72 | Ht 71.0 in | Wt 328.0 lb

## 2017-06-10 DIAGNOSIS — Z0000 Encounter for general adult medical examination without abnormal findings: Secondary | ICD-10-CM | POA: Diagnosis not present

## 2017-06-10 DIAGNOSIS — E119 Type 2 diabetes mellitus without complications: Secondary | ICD-10-CM

## 2017-06-10 NOTE — Progress Notes (Signed)
Subjective:   Kurt Blevins is a 56 y.o. male who presents for an Initial Medicare Annual Wellness Visit. Mr Kurt Blevins is married and lives at home with his wife. He has 3 adult biological children and one adult adopted son. His 64 year old grandson spends the night with them on Friday nights and he enjoys his company. He is disabled due to problems with his back and multiple surgeries.   Review of Systems  Reports that health is a little worse than last year.   Cardio: sedentary lifestyle, obesity, diabetes, hypertension, hyperlipidemia  Musculoskeletal: chronic back pain  Other systems negative.    Objective:  BP 132/83   Pulse 72   Ht 5\' 11"  (1.803 m)   Wt (!) 328 lb (148.8 kg)   BMI 45.75 kg/m   Current Medications (verified) Outpatient Encounter Medications as of 06/10/2017  Medication Sig  . aspirin 81 MG tablet Take 81 mg by mouth daily.  Marland Kitchen atorvastatin (LIPITOR) 40 MG tablet Take 1 tablet (40 mg total) by mouth daily.  . fenofibrate 160 MG tablet Take 1 tablet (160 mg total) by mouth daily.  Marland Kitchen glipiZIDE (GLUCOTROL) 5 MG tablet Take 1 tablet (5 mg total) by mouth 2 (two) times daily before a meal.  . ipratropium-albuterol (DUONEB) 0.5-2.5 (3) MG/3ML SOLN Take 3 mLs by nebulization 3 (three) times daily as needed.  Marland Kitchen lisinopril-hydrochlorothiazide (PRINZIDE,ZESTORETIC) 20-25 MG tablet Take 1 tablet by mouth daily.  . metFORMIN (GLUCOPHAGE) 1000 MG tablet Take 1 tablet (1,000 mg total) by mouth 2 (two) times daily with a meal.  . ONETOUCH DELICA LANCETS 63W MISC Check blood sugar 1x per day and prn  E 11.9  . ONETOUCH VERIO test strip CHECK BLOOD SUGAR ONCE DAILY AS DIRECTED.  Marland Kitchen umeclidinium-vilanterol (ANORO ELLIPTA) 62.5-25 MCG/INH AEPB Inhale 1 puff into the lungs daily.   No facility-administered encounter medications on file as of 06/10/2017.     Allergies (verified) Eggs or egg-derived products; Peanut-containing drug products; and Penicillins   History: Past  Medical History:  Diagnosis Date  . COPD (chronic obstructive pulmonary disease) ([place])   . Hyperlipidemia   . Hypertension   . Neuropathy    patient reported   Past Surgical History:  Procedure Laterality Date  . BACK SURGERY  1990s  . HERNIA REPAIR    . NECK SURGERY    . TONSILLECTOMY     Family History  Problem Relation Age of Onset  . Diabetes Mother   . Hypertension Mother   . Hyperlipidemia Mother   . Arthritis Mother   . Osteoporosis Mother   . Diabetes Father   . Hypertension Father   . Hyperlipidemia Father   . Heart attack Father 78       Late 70s and 64s  . Cancer Paternal Grandmother        colon  . Healthy Sister   . Healthy Brother   . Healthy Daughter   . Healthy Son   . Healthy Son   . Healthy Son    Social History   Occupational History  . Occupation: Disabled    Comment: Back problems  Tobacco Use  . Smoking status: Former Smoker    Types: Cigarettes    Last attempt to quit: 11/26/2008    Years since quitting: 8.5  . Smokeless tobacco: Never Used  Substance and Sexual Activity  . Alcohol use: No  . Drug use: No  . Sexual activity: Not on file   Tobacco Counseling No current  tobacco use  Activities of Daily Living In your present state of health, do you have any difficulty performing the following activities: 06/10/2017  Hearing? Y  Comment wife says that he has some hearing loss. Has been evaluated in the past. About 45% loss in ears  Vision? Y  Comment has trouble with reading close up  Difficulty concentrating or making decisions? Y  Comment noticed some decrease in short term memory  Walking or climbing stairs? Y  Comment has 3 steps at home with handrails. Has some difficulty due to back pain and neuroapthy  Dressing or bathing? N  Doing errands, shopping? N  Preparing Food and eating ? N  Using the Toilet? N  In the past six months, have you accidently leaked urine? N  Do you have problems with loss of bowel control? N    Managing your Medications? N  Comment uses pillbox  Managing your Finances? N  Housekeeping or managing your Housekeeping? N  Some recent data might be hidden    Immunizations and Health Maintenance Immunization History  Administered Date(s) Administered  . Tdap 02/01/2014   Health Maintenance Due  Topic Date Due  . PNEUMOCOCCAL POLYSACCHARIDE VACCINE (1) 10/17/1963  . OPHTHALMOLOGY EXAM  10/17/1971  . HIV Screening  10/16/1976  . COLONOSCOPY  10/17/2011    Patient Care Team: Chevis Pretty, FNP as PCP - General (Nurse Practitioner)  No hospitalizations, ER visits, or surgeries this past year.     Assessment:   This is a routine wellness examination for Kurt Blevins.   Hearing/Vision screen No deficit noted during visit. Patient is overdue for an eye exam.  Dietary issues and exercise activities discussed: Current Exercise Habits: Home exercise routine, Type of exercise: walking, Time (Minutes): 20, Frequency (Times/Week): 7, Weekly Exercise (Minutes/Week): 140, Intensity: Mild, Exercise limited by: respiratory conditions(s);neurologic condition(s);orthopedic condition(s)  Goals Chair exercises daily. Handout given.   Depression Screen PHQ 2/9 Scores 06/10/2017 04/02/2017 12/24/2016 08/23/2016  PHQ - 2 Score 0 0 0 0    Fall Risk Fall Risk  06/10/2017 04/02/2017 12/24/2016 08/23/2016 02/02/2016  Falls in the past year? No No No No No  Comment uses can to help with balance - - - -  Number falls in past yr: - - - - -  Follow up Falls prevention discussed - - - -    Cognitive Function: MMSE - Mini Mental State Exam 06/10/2017  Orientation to time 5  Orientation to Place 5  Registration 3  Attention/ Calculation 0  Attention/Calculation-comments not attempted  Recall 3  Language- name 2 objects 2  Language- repeat 1  Language- follow 3 step command 3  Language- read & follow direction 1  Write a sentence 0  Copy design 0  Total score 23        Screening  Tests Health Maintenance  Topic Date Due  . PNEUMOCOCCAL POLYSACCHARIDE VACCINE (1) 10/17/1963  . OPHTHALMOLOGY EXAM  10/17/1971  . HIV Screening  10/16/1976  . COLONOSCOPY  10/17/2011  . HEMOGLOBIN A1C  09/30/2017  . FOOT EXAM  04/02/2018  . TETANUS/TDAP  02/02/2024  . Hepatitis C Screening  Completed        Plan:  Schedule eye exam. Importance discussed.  Return Cologuard. Importance discussed.  Chair exercises daily. Handout given and reviewed.  Move carefully to avoid falls.  Appt scheduled with diabetes educator at our office on 06/26/17.  [employer] professional placed. Patient to remove and return on 06/24/17 for download.   I have personally reviewed  and noted the following in the patient's chart:   . Medical and social history . Use of alcohol, tobacco or illicit drugs  . Current medications and supplements . Functional ability and status . Nutritional status . Physical activity . Advanced directives . List of other physicians . Hospitalizations, surgeries, and ER visits in previous 12 months . Vitals . Screenings to include cognitive, depression, and falls . Referrals and appointments  In addition, I have reviewed and discussed with patient certain preventive protocols, quality metrics, and best practice recommendations. A written personalized care plan for preventive services as well as general preventive health recommendations were provided to patient.     Chong Sicilian, RN  06/10/2017    I have reviewed and agree with the above AWV documentation.   Mary-Margaret Hassell Done, FNP

## 2017-06-10 NOTE — Patient Instructions (Addendum)
  Kurt Blevins , Thank you for taking time to come for your Medicare Wellness Visit. I appreciate your ongoing commitment to your health goals. Please review the following plan we discussed and let me know if I can assist you in the future.   These are the goals we discussed: Chair exercises daily Try to manage portions  This is a list of the screening recommended for you and due dates:  Health Maintenance  Topic Date Due  . Pneumococcal vaccine (1) 10/17/1963  . Eye exam for diabetics  10/17/1971  . HIV Screening  10/16/1976  . Colon Cancer Screening  10/17/2011  . Hemoglobin A1C  09/30/2017  . Complete foot exam   04/02/2018  . Tetanus Vaccine  02/02/2024  .  Hepatitis C: One time screening is recommended by Center for Disease Control  (CDC) for  adults born from [birth year] through 1965.   Completed   Schedule eye exam and have them send a copy to our office Return Cologuard to screen for colon cancer  Remove sensor on 06/24/17 and return to me, [name]. [phone]

## 2017-06-26 ENCOUNTER — Ambulatory Visit (INDEPENDENT_AMBULATORY_CARE_PROVIDER_SITE_OTHER): Payer: Medicare Other | Admitting: *Deleted

## 2017-06-26 ENCOUNTER — Telehealth: Payer: Self-pay | Admitting: Nurse Practitioner

## 2017-06-26 ENCOUNTER — Other Ambulatory Visit: Payer: Self-pay | Admitting: Nurse Practitioner

## 2017-06-26 MED ORDER — SEMAGLUTIDE(0.25 OR 0.5MG/DOS) 2 MG/1.5ML ~~LOC~~ SOPN: 0.5000 mg | PEN_INJECTOR | SUBCUTANEOUS | 5 refills | Status: DC

## 2017-06-26 MED ORDER — SEMAGLUTIDE(0.25 OR 0.5MG/DOS) 2 MG/1.5ML ~~LOC~~ SOPN: 0.2500 mg | PEN_INJECTOR | SUBCUTANEOUS | 0 refills | Status: DC

## 2017-06-26 NOTE — Telephone Encounter (Signed)
Patient saw diabetic educator today and revealed that he does not always take his glipizide nor does he check his blood sugars every day. It was suggested that he stop his gliburide and star t on ozempic. First dose was given in office. His libre CGM results were shown and discussed with him today. He will start checking blood sugars every morning and keep a diary. Carb counting encouraged. He will follow up in 1 month  rx for ozempic sent to pharmacy with increasing from 0.25to 0.5 in 1 month

## 2017-07-04 ENCOUNTER — Ambulatory Visit (INDEPENDENT_AMBULATORY_CARE_PROVIDER_SITE_OTHER): Payer: Medicare Other | Admitting: Nurse Practitioner

## 2017-07-04 ENCOUNTER — Encounter: Payer: Self-pay | Admitting: Nurse Practitioner

## 2017-07-04 VITALS — BP 136/84 | HR 68 | Temp 96.9°F | Ht 71.0 in | Wt 325.0 lb

## 2017-07-04 DIAGNOSIS — J449 Chronic obstructive pulmonary disease, unspecified: Secondary | ICD-10-CM | POA: Diagnosis not present

## 2017-07-04 DIAGNOSIS — I10 Essential (primary) hypertension: Secondary | ICD-10-CM | POA: Diagnosis not present

## 2017-07-04 DIAGNOSIS — E782 Mixed hyperlipidemia: Secondary | ICD-10-CM

## 2017-07-04 DIAGNOSIS — E119 Type 2 diabetes mellitus without complications: Secondary | ICD-10-CM

## 2017-07-04 LAB — BAYER DCA HB A1C WAIVED: HB A1C: 7.7 % — AB (ref <7.0)

## 2017-07-04 NOTE — Progress Notes (Signed)
Subjective:    Patient ID: Kurt Blevins, male    DOB: 14-Jul-1961, 56 y.o.   MRN: [id number]  HPI   Kurt Blevins is here today for follow up of chronic medical problem.  Outpatient Encounter Medications as of 07/04/2017  Medication Sig  . aspirin 81 MG tablet Take 81 mg by mouth daily.  Marland Kitchen atorvastatin (LIPITOR) 40 MG tablet Take 1 tablet (40 mg total) by mouth daily.  . fenofibrate 160 MG tablet Take 1 tablet (160 mg total) by mouth daily.  Marland Kitchen ipratropium-albuterol (DUONEB) 0.5-2.5 (3) MG/3ML SOLN Take 3 mLs by nebulization 3 (three) times daily as needed.  Marland Kitchen lisinopril-hydrochlorothiazide (PRINZIDE,ZESTORETIC) 20-25 MG tablet Take 1 tablet by mouth daily.  . metFORMIN (GLUCOPHAGE) 1000 MG tablet Take 1 tablet (1,000 mg total) by mouth 2 (two) times daily with a meal.  . ONETOUCH DELICA LANCETS 26R MISC Check blood sugar 1x per day and prn  E 11.9  . ONETOUCH VERIO test strip CHECK BLOOD SUGAR ONCE DAILY AS DIRECTED.  . Semaglutide (OZEMPIC) 0.25 or 0.5 MG/DOSE SOPN Inject 0.25 mg into the skin once a week.  . umeclidinium-vilanterol (ANORO ELLIPTA) 62.5-25 MCG/INH AEPB Inhale 1 puff into the lungs daily.  . Semaglutide (OZEMPIC) 0.25 or 0.5 MG/DOSE SOPN Inject 0.5 mg into the skin once a week.   No facility-administered encounter medications on file as of 07/04/2017.     1. Type 2 diabetes mellitus without complication, without long-term current use of insulin ([place]) last hgba1c was 7.9%. He saw the [place] last week and she started  Him on ozempic and actually gave him  A shot in the office. She also stopped his glipizide. He says that his blood sugars have really cone down. However insurance will not pay for ozempic.  2. Mixed hyperlipidemia  Not watching diet  3. Essential hypertension  No c/o chest pain, increasing SOB or headache. Does not check blood pressure at home  4. COPD with asthma ([place])  No change in breathing  5. Severe obesity (BMI >= 40) (HCC)  No weight  changes    New complaints: cough and congestion for 2 weeks. Denies any fever but did have cold chills one night last week  Social history: On disability for COPD   Review of Systems  Constitutional: Negative for activity change and appetite change.  HENT: Positive for congestion and postnasal drip.   Eyes: Negative for pain.  Respiratory: Positive for cough and shortness of breath (no more then usual).   Cardiovascular: Negative for chest pain, palpitations and leg swelling.  Gastrointestinal: Negative for abdominal pain.  Endocrine: Negative for polydipsia.  Genitourinary: Negative.   Skin: Negative for rash.  Neurological: Negative for dizziness, weakness and headaches.  Hematological: Does not bruise/bleed easily.  Psychiatric/Behavioral: Negative.   All other systems reviewed and are negative.      Objective:   Physical Exam  Constitutional: He is oriented to person, place, and time. He appears well-developed and well-nourished.  HENT:  Head: Normocephalic.  Right Ear: External ear normal.  Left Ear: External ear normal.  Nose: Nose normal.  Mouth/Throat: Oropharynx is clear and moist.  Eyes: EOM are normal. Pupils are equal, round, and reactive to light.  Neck: Normal range of motion. Neck supple. No JVD present. No thyromegaly present.  Cardiovascular: Normal rate, regular rhythm, normal heart sounds and intact distal pulses. Exam reveals no gallop and no friction rub.  No murmur heard. Pulmonary/Chest: Effort normal and breath sounds normal. No  respiratory distress. He has no wheezes. He has no rales. He exhibits no tenderness.  Abdominal: Soft. Bowel sounds are normal. He exhibits no mass. There is no tenderness.  Genitourinary: Prostate normal and penis normal.  Musculoskeletal: Normal range of motion. He exhibits no edema.  Lymphadenopathy:    He has no cervical adenopathy.  Neurological: He is alert and oriented to person, place, and time. No cranial nerve  deficit.  Skin: Skin is warm and dry.  Psychiatric: He has a normal mood and affect. His behavior is normal. Judgment and thought content normal.   BP 136/84   Pulse 68   Temp (!) 96.9 F (36.1 C) (Oral)   Ht _0  (1.803 m)   Wt (!) 325 lb (147.4 kg)   SpO2 97%   BMI 45.33 kg/m   hgba1c 7.7%       Assessment & Plan:  1. Type 2 diabetes mellitus without complication, without long-term current use of insulin (HCC) Continue to watch carbs in diet Patient was given another ozemoic sample for this week. Wife will call insurance ad see what they will pay for in that class - Bayer DCA Hb A1c Waived  2. Mixed hyperlipidemia Low fat diet - Lipid panel  3. Essential hypertension Low sodium diet - CMP14+EGFR  4. COPD with asthma ([place]) Keep follow up with pulmonology  5. Severe obesity (BMI >= 40) (HCC) Discussed diet and exercise for person with BMI >25 Will recheck weight in 3-6 months     Labs pending Health maintenance reviewed Diet and exercise encouraged Continue all meds Follow up  In 3 months   [place], FNP

## 2017-07-04 NOTE — Patient Instructions (Signed)

## 2017-07-05 LAB — CMP14+EGFR
ALK PHOS: 64 IU/L (ref 39–117)
ALT: 33 IU/L (ref 0–44)
AST: 22 IU/L (ref 0–40)
Albumin/Globulin Ratio: 2.2 (ref 1.2–2.2)
Albumin: 4.2 g/dL (ref 3.5–5.5)
BILIRUBIN TOTAL: 0.3 mg/dL (ref 0.0–1.2)
BUN/Creatinine Ratio: 16 (ref 9–20)
BUN: 19 mg/dL (ref 6–24)
CHLORIDE: 102 mmol/L (ref 96–106)
CO2: 28 mmol/L (ref 20–29)
CREATININE: 1.21 mg/dL (ref 0.76–1.27)
Calcium: 9.4 mg/dL (ref 8.7–10.2)
GFR calc non Af Amer: 67 mL/min/{1.73_m2} (ref >59)
GFR, EST AFRICAN AMERICAN: 77 mL/min/{1.73_m2} (ref >59)
GLUCOSE: 182 mg/dL — AB (ref 65–99)
Globulin, Total: 1.9 g/dL (ref 1.5–4.5)
Potassium: 4.2 mmol/L (ref 3.5–5.2)
Sodium: 142 mmol/L (ref 134–144)
TOTAL PROTEIN: 6.1 g/dL (ref 6.0–8.5)

## 2017-07-05 LAB — LIPID PANEL
CHOLESTEROL TOTAL: 117 mg/dL (ref 100–199)
Chol/HDL Ratio: 4.7 ratio (ref 0.0–5.0)
HDL: 25 mg/dL — AB (ref >39)
LDL CALC: 14 mg/dL (ref 0–99)
Triglycerides: 388 mg/dL — ABNORMAL HIGH (ref 0–149)
VLDL CHOLESTEROL CAL: 78 mg/dL — AB (ref 5–40)

## 2017-07-25 ENCOUNTER — Telehealth: Payer: Self-pay

## 2017-07-25 MED ORDER — DULAGLUTIDE 1.5 MG/0.5ML ~~LOC~~ SOAJ: 1.5000 mg | SUBCUTANEOUS | 3 refills | Status: DC

## 2017-07-25 NOTE — Telephone Encounter (Signed)
Insurance no preferred [employer]   Preferred are Bydureon Trulicity Victoza and Byetta    I do not see in HX that he has tried any of these

## 2017-07-25 NOTE — Telephone Encounter (Signed)
changed from ozempic to trulicity

## 2017-07-26 ENCOUNTER — Other Ambulatory Visit: Payer: Self-pay | Admitting: Nurse Practitioner

## 2017-07-26 DIAGNOSIS — E782 Mixed hyperlipidemia: Secondary | ICD-10-CM

## 2017-10-01 ENCOUNTER — Ambulatory Visit (INDEPENDENT_AMBULATORY_CARE_PROVIDER_SITE_OTHER): Payer: Medicare Other | Admitting: Nurse Practitioner

## 2017-10-01 ENCOUNTER — Encounter: Payer: Self-pay | Admitting: Nurse Practitioner

## 2017-10-01 VITALS — BP 138/83 | HR 74 | Temp 96.9°F | Ht 71.0 in | Wt 310.0 lb

## 2017-10-01 DIAGNOSIS — J449 Chronic obstructive pulmonary disease, unspecified: Secondary | ICD-10-CM

## 2017-10-01 DIAGNOSIS — E782 Mixed hyperlipidemia: Secondary | ICD-10-CM | POA: Diagnosis not present

## 2017-10-01 DIAGNOSIS — E119 Type 2 diabetes mellitus without complications: Secondary | ICD-10-CM

## 2017-10-01 DIAGNOSIS — J4489 Other specified chronic obstructive pulmonary disease: Secondary | ICD-10-CM

## 2017-10-01 DIAGNOSIS — I10 Essential (primary) hypertension: Secondary | ICD-10-CM | POA: Diagnosis not present

## 2017-10-01 LAB — BAYER DCA HB A1C WAIVED: HB A1C (BAYER DCA - WAIVED): 7.1 % — ABNORMAL HIGH (ref <7.0)

## 2017-10-01 MED ORDER — FENOFIBRATE 160 MG PO TABS: 160.0000 mg | ORAL_TABLET | Freq: Every day | ORAL | 5 refills | Status: DC

## 2017-10-01 MED ORDER — LISINOPRIL-HYDROCHLOROTHIAZIDE 20-25 MG PO TABS: 1.0000 | ORAL_TABLET | Freq: Every day | ORAL | 5 refills | Status: DC

## 2017-10-01 MED ORDER — ATORVASTATIN CALCIUM 40 MG PO TABS: 40.0000 mg | ORAL_TABLET | Freq: Every day | ORAL | 5 refills | Status: DC

## 2017-10-01 MED ORDER — UMECLIDINIUM-VILANTEROL 62.5-25 MCG/INH IN AEPB: 1.0000 | INHALATION_SPRAY | Freq: Every day | RESPIRATORY_TRACT | 0 refills | Status: DC

## 2017-10-01 MED ORDER — METFORMIN HCL 1000 MG PO TABS: 1000.0000 mg | ORAL_TABLET | Freq: Two times a day (BID) | ORAL | 1 refills | Status: DC

## 2017-10-01 NOTE — Patient Instructions (Signed)
Fat and Cholesterol Restricted Diet High levels of fat and cholesterol in your blood may lead to various health problems, such as diseases of the heart, blood vessels, gallbladder, liver, and pancreas. Fats are concentrated sources of energy that come in various forms. Certain types of fat, including saturated fat, may be harmful in excess. Cholesterol is a substance needed by your body in small amounts. Your body makes all the cholesterol it needs. Excess cholesterol comes from the food you eat. When you have high levels of cholesterol and saturated fat in your blood, health problems can develop because the excess fat and cholesterol will gather along the walls of your blood vessels, causing them to narrow. Choosing the right foods will help you control your intake of fat and cholesterol. This will help keep the levels of these substances in your blood within normal limits and reduce your risk of disease. What is my plan? Your health care provider recommends that you:  Limit your fat intake to ______% or less of your total calories per day.  Limit the amount of cholesterol in your diet to less than _________mg per day.  Eat 20-30 grams of fiber each day.  What types of fat should I choose?  Choose healthy fats more often. Choose monounsaturated and polyunsaturated fats, such as olive and canola oil, flaxseeds, walnuts, almonds, and seeds.  Eat more omega-3 fats. Good choices include salmon, mackerel, sardines, tuna, flaxseed oil, and ground flaxseeds. Aim to eat fish at least two times a week.  Limit saturated fats. Saturated fats are primarily found in animal products, such as meats, butter, and cream. Plant sources of saturated fats include palm oil, palm kernel oil, and coconut oil.  Avoid foods with partially hydrogenated oils in them. These contain trans fats. Examples of foods that contain trans fats are stick margarine, some tub margarines, cookies, crackers, and other baked goods. What  general guidelines do I need to follow? These guidelines for healthy eating will help you control your intake of fat and cholesterol:  Check food labels carefully to identify foods with trans fats or high amounts of saturated fat.  Fill one half of your plate with vegetables and green salads.  Fill one fourth of your plate with whole grains. Look for the word "whole" as the first word in the ingredient list.  Fill one fourth of your plate with lean protein foods.  Limit fruit to two servings a day. Choose fruit instead of juice.  Eat more foods that contain fiber, such as apples, broccoli, carrots, beans, peas, and barley.  Eat more home-cooked food and less restaurant, buffet, and fast food.  Limit or avoid alcohol.  Limit foods high in starch and sugar.  Limit fried foods.  Cook foods using methods other than frying. Baking, boiling, grilling, and broiling are all great options.  Lose weight if you are overweight. Losing just 5-10% of your initial body weight can help your overall health and prevent diseases such as diabetes and heart disease.  What foods can I eat? Grains  Whole grains, such as whole wheat or whole grain breads, crackers, cereals, and pasta. Unsweetened oatmeal, bulgur, barley, quinoa, or brown rice. Corn or whole wheat flour tortillas. Vegetables  Fresh or frozen vegetables (raw, steamed, roasted, or grilled). Green salads. Fruits  All fresh, canned (in natural juice), or frozen fruits. Meats and other protein foods  Ground beef (85% or leaner), grass-fed beef, or beef trimmed of fat. Skinless chicken or turkey. Ground chicken or turkey.   Pork trimmed of fat. All fish and seafood. Eggs. Dried beans, peas, or lentils. Unsalted nuts or seeds. Unsalted canned or dry beans. Dairy  Low-fat dairy products, such as skim or 1% milk, 2% or reduced-fat cheeses, low-fat ricotta or cottage cheese, or plain low-fat yo Fats and oils  Tub margarines without trans  fats. Light or reduced-fat mayonnaise and salad dressings. Avocado. Olive, canola, sesame, or safflower oils. Natural peanut or almond butter (choose ones without added sugar and oil). The items listed above may not be a complete list of recommended foods or beverages. Contact your dietitian for more options. Foods to avoid Grains  White bread. White pasta. White rice. Cornbread. Bagels, pastries, and croissants. Crackers that contain trans fat. Vegetables  White potatoes. Corn. Creamed or fried vegetables. Vegetables in a cheese sauce. Fruits  Dried fruits. Canned fruit in light or heavy syrup. Fruit juice. Meats and other protein foods  Fatty cuts of meat. Ribs, chicken wings, bacon, sausage, bologna, salami, chitterlings, fatback, hot dogs, bratwurst, and packaged luncheon meats. Liver and organ meats. Dairy  Whole or 2% milk, cream, half-and-half, and cream cheese. Whole milk cheeses. Whole-fat or sweetened yogurt. Full-fat cheeses. Nondairy creamers and whipped toppings. Processed cheese, cheese spreads, or cheese curds. Beverages  Alcohol. Sweetened drinks (such as sodas, lemonade, and fruit drinks or punches). Fats and oils  Butter, stick margarine, lard, shortening, ghee, or bacon fat. Coconut, palm kernel, or palm oils. Sweets and desserts  Corn syrup, sugars, honey, and molasses. Candy. Jam and jelly. Syrup. Sweetened cereals. Cookies, pies, cakes, donuts, muffins, and ice cream. The items listed above may not be a complete list of foods and beverages to avoid. Contact your dietitian for more information. This information is not intended to replace advice given to you by your health care provider. Make sure you discuss any questions you have with your health care provider. Document Released: 07/09/2005 Document Revised: 07/30/2014 Document Reviewed: 10/07/2013 Elsevier Interactive Patient Education  2018 Elsevier Inc.  

## 2017-10-01 NOTE — Progress Notes (Signed)
Subjective:    Patient ID: Kurt Blevins, male    DOB: [birth year], 57 y.o.   MRN: [id number]  HPI   Kurt Blevins is here today for follow up of chronic medical problem.  Outpatient Encounter Medications as of 10/01/2017  Medication Sig  . aspirin 81 MG tablet Take 81 mg by mouth daily.  Marland Kitchen atorvastatin (LIPITOR) 40 MG tablet Take 1 tablet (40 mg total) by mouth daily.  . fenofibrate 160 MG tablet Take 1 tablet (160 mg total) by mouth daily.  Marland Kitchen ipratropium-albuterol (DUONEB) 0.5-2.5 (3) MG/3ML SOLN Take 3 mLs by nebulization 3 (three) times daily as needed.  Marland Kitchen lisinopril-hydrochlorothiazide (PRINZIDE,ZESTORETIC) 20-25 MG tablet Take 1 tablet by mouth daily.  . metFORMIN (GLUCOPHAGE) 1000 MG tablet Take 1 tablet (1,000 mg total) by mouth 2 (two) times daily with a meal.  . ONETOUCH DELICA LANCETS 94B MISC Check blood sugar 1x per day and prn  E 11.9  . ONETOUCH VERIO test strip CHECK BLOOD SUGAR ONCE DAILY AS DIRECTED.  Marland Kitchen umeclidinium-vilanterol (ANORO ELLIPTA) 62.5-25 MCG/INH AEPB Inhale 1 puff into the lungs daily.     1. Type 2 diabetes mellitus without complication, without long-term current use of insulin ([place]) last hgba1c was 7.7%. He was started on trulicity. Trulicity made him sick and he only took it one time. Fasting blood sugars have been running around 120-130.  2. Mixed hyperlipidemia  Does not watch diet at all. Very little exercise  3. Essential hypertension  No c/o chest pain, sob or headache. Does not check blood pressure at home. BP Readings from Last 3 Encounters:  07/04/17 136/84  06/12/17 132/83  04/09/17 (!) 143/85     4. COPD with asthma ([place])  Is currently on anoro and duoneb. Has had a slight cough but breathing has gotten no worse.  5. Severe obesity (BMI >= 40) (HCC)  Weight down 15 lbs from previous visit    New complaints: none today  Social history: Lives with wife   Review of Systems  Constitutional: Negative for activity change and  appetite change.  HENT: Negative.   Eyes: Negative for pain.  Respiratory: Negative for shortness of breath.   Cardiovascular: Negative for chest pain, palpitations and leg swelling.  Gastrointestinal: Negative for abdominal pain.  Endocrine: Negative for polydipsia.  Genitourinary: Negative.   Skin: Negative for rash.  Neurological: Negative for dizziness, weakness and headaches.  Hematological: Does not bruise/bleed easily.  Psychiatric/Behavioral: Negative.   All other systems reviewed and are negative.      Objective:   Physical Exam  Constitutional: He is oriented to person, place, and time. He appears well-developed and well-nourished.  HENT:  Head: Normocephalic.  Right Ear: External ear normal.  Left Ear: External ear normal.  Nose: Nose normal.  Mouth/Throat: Oropharynx is clear and moist.  Eyes: EOM are normal. Pupils are equal, round, and reactive to light.  Neck: Normal range of motion. Neck supple. No JVD present. No thyromegaly present.  Cardiovascular: Normal rate, regular rhythm, normal heart sounds and intact distal pulses. Exam reveals no gallop and no friction rub.  No murmur heard. Pulmonary/Chest: Effort normal and breath sounds normal. No respiratory distress. He has no wheezes. He has no rales. He exhibits no tenderness.  Abdominal: Soft. Bowel sounds are normal. He exhibits no mass. There is no tenderness.  Genitourinary: Prostate normal and penis normal.  Musculoskeletal: Normal range of motion. He exhibits no edema.  Lymphadenopathy:    He has no cervical adenopathy.  Neurological: He is alert and oriented to person, place, and time. No cranial nerve deficit.  Skin: Skin is warm and dry.  Psychiatric: He has a normal mood and affect. His behavior is normal. Judgment and thought content normal.   BP 138/83   Pulse 74   Temp (!) 96.9 F (36.1 C) (Oral)   Ht '5\' 11"'  (1.803 m)   Wt (!) 310 lb (140.6 kg)   BMI 43.24 kg/m   hgba1c 7.1% today        Assessment & Plan:  1. Type 2 diabetes mellitus without complication, without long-term current use of insulin (HCC) Continue to watch carbs in diet - Bayer DCA Hb A1c Waived - metFORMIN (GLUCOPHAGE) 1000 MG tablet; Take 1 tablet (1,000 mg total) by mouth 2 (two) times daily with a meal.  Dispense: 180 tablet; Refill: 1  2. Mixed hyperlipidemia Low fat diet - Lipid panel - atorvastatin (LIPITOR) 40 MG tablet; Take 1 tablet (40 mg total) by mouth daily.  Dispense: 30 tablet; Refill: 5 - fenofibrate 160 MG tablet; Take 1 tablet (160 mg total) by mouth daily.  Dispense: 30 tablet; Refill: 5  3. Essential hypertension Low sodium diet - CMP14+EGFR - lisinopril-hydrochlorothiazide (PRINZIDE,ZESTORETIC) 20-25 MG tablet; Take 1 tablet by mouth daily.  Dispense: 30 tablet; Refill: 5  4. COPD with asthma ([place]) Keep follow up with pulmonlogy - umeclidinium-vilanterol (ANORO ELLIPTA) 62.5-25 MCG/INH AEPB; Inhale 1 puff into the lungs daily.  Dispense: 4 each; Refill: 0  5. Severe obesity (BMI >= 40) (HCC) Discussed diet and exercise for person with BMI >25 Will recheck weight in 3-6 months    Encouraged to get eye exam Ordered cologard Labs pending Health maintenance reviewed Diet and exercise encouraged Continue all meds Follow up  In 3 month    [place], FNP

## 2017-10-02 LAB — CMP14+EGFR
A/G RATIO: 2.1 (ref 1.2–2.2)
ALK PHOS: 59 IU/L (ref 39–117)
ALT: 35 IU/L (ref 0–44)
AST: 24 IU/L (ref 0–40)
Albumin: 4.5 g/dL (ref 3.5–5.5)
BUN/Creatinine Ratio: 15 (ref 9–20)
BUN: 18 mg/dL (ref 6–24)
Bilirubin Total: 0.3 mg/dL (ref 0.0–1.2)
CO2: 23 mmol/L (ref 20–29)
CREATININE: 1.2 mg/dL (ref 0.76–1.27)
Calcium: 9.9 mg/dL (ref 8.7–10.2)
Chloride: 101 mmol/L (ref 96–106)
GFR calc Af Amer: 78 mL/min/{1.73_m2} (ref >59)
GFR calc non Af Amer: 68 mL/min/{1.73_m2} (ref >59)
GLOBULIN, TOTAL: 2.1 g/dL (ref 1.5–4.5)
Glucose: 97 mg/dL (ref 65–99)
POTASSIUM: 4.7 mmol/L (ref 3.5–5.2)
SODIUM: 142 mmol/L (ref 134–144)
Total Protein: 6.6 g/dL (ref 6.0–8.5)

## 2017-10-02 LAB — LIPID PANEL
CHOLESTEROL TOTAL: 130 mg/dL (ref 100–199)
Chol/HDL Ratio: 4.3 ratio (ref 0.0–5.0)
HDL: 30 mg/dL — AB (ref >39)
LDL Calculated: 34 mg/dL (ref 0–99)
TRIGLYCERIDES: 331 mg/dL — AB (ref 0–149)
VLDL Cholesterol Cal: 66 mg/dL — ABNORMAL HIGH (ref 5–40)

## 2017-10-03 ENCOUNTER — Ambulatory Visit: Payer: Medicare Other | Admitting: Nurse Practitioner

## 2017-10-23 ENCOUNTER — Encounter: Payer: Self-pay | Admitting: *Deleted

## 2017-10-28 ENCOUNTER — Other Ambulatory Visit: Payer: Self-pay | Admitting: Nurse Practitioner

## 2017-10-28 DIAGNOSIS — E782 Mixed hyperlipidemia: Secondary | ICD-10-CM

## 2017-10-28 DIAGNOSIS — I10 Essential (primary) hypertension: Secondary | ICD-10-CM

## 2017-10-29 DIAGNOSIS — Z1212 Encounter for screening for malignant neoplasm of rectum: Secondary | ICD-10-CM | POA: Diagnosis not present

## 2017-10-29 DIAGNOSIS — Z1211 Encounter for screening for malignant neoplasm of colon: Secondary | ICD-10-CM | POA: Diagnosis not present

## 2017-11-08 LAB — COLOGUARD: Cologuard: POSITIVE

## 2017-11-12 ENCOUNTER — Telehealth: Payer: Self-pay | Admitting: Nurse Practitioner

## 2017-11-12 ENCOUNTER — Other Ambulatory Visit: Payer: Self-pay

## 2017-11-12 DIAGNOSIS — R195 Other fecal abnormalities: Secondary | ICD-10-CM

## 2017-11-12 NOTE — Telephone Encounter (Signed)
cologuard results were posiive. NEEDS to have colonoscopy ASAP- where wold he like to go?

## 2017-11-12 NOTE — Telephone Encounter (Signed)
Patient aware of results and recommendations and wife will call back with where to have colonoscopy

## 2017-11-13 ENCOUNTER — Encounter: Payer: Self-pay | Admitting: Gastroenterology

## 2017-11-13 ENCOUNTER — Other Ambulatory Visit: Payer: Self-pay | Admitting: Nurse Practitioner

## 2017-11-13 DIAGNOSIS — E119 Type 2 diabetes mellitus without complications: Secondary | ICD-10-CM

## 2017-12-04 ENCOUNTER — Encounter: Payer: Self-pay | Admitting: Pediatrics

## 2017-12-04 ENCOUNTER — Ambulatory Visit (INDEPENDENT_AMBULATORY_CARE_PROVIDER_SITE_OTHER): Payer: Medicare Other | Admitting: Pediatrics

## 2017-12-04 VITALS — BP 129/80 | HR 69 | Temp 100.0°F | Ht 71.0 in | Wt 313.0 lb

## 2017-12-04 DIAGNOSIS — L03012 Cellulitis of left finger: Secondary | ICD-10-CM | POA: Diagnosis not present

## 2017-12-04 MED ORDER — SULFAMETHOXAZOLE-TRIMETHOPRIM 800-160 MG PO TABS: 1.0000 | ORAL_TABLET | Freq: Two times a day (BID) | ORAL | 0 refills | Status: DC

## 2017-12-04 NOTE — Progress Notes (Addendum)
  Subjective:   Patient ID: Kurt Blevins, male    DOB: [birth year], 57 y.o.   MRN: [id number] CC: Hand pain HPI: Kurt Blevins is a 57 y.o. male  Noticed bug bites on his left middle finger about 2 days ago.  That evening was more swollen, yesterday swelling increased and yesterday evening.  Has not taken anything for the pain.  Some swelling in the rest of his hand.  Had a hard time getting off his ring on his fourth left finger.  No  injury that he knows of.  No fevers or chills.  Appetite is been okay.  Finger hurts when he tries to bend it.  Not recently had a breathing treatment. This time of year he uses inhaler more often due to allergies. Relevant past medical, surgical, family and social history reviewed. Allergies and medications reviewed and updated. Social History   Tobacco Use  Smoking Status Former Smoker  . Types: Cigarettes  . Last attempt to quit: 11/26/2008  . Years since quitting: 9.0  Smokeless Tobacco Never Used   ROS: Per HPI   Objective:    BP 129/80   Pulse 69   Temp 100 F (37.8 C) (Oral)   Ht 5\' 11"  (1.803 m)   Wt (!) 313 lb (142 kg)   BMI 43.65 kg/m   Wt Readings from Last 3 Encounters:  12/04/17 (!) 313 lb (142 kg)  10/01/17 (!) 310 lb (140.6 kg)  07/04/17 (!) 325 lb (147.4 kg)   Gen: NAD, alert, cooperative with exam, NCAT EYES: EOMI, no conjunctival injection, or no icterus CV: NRRR, normal S1/S2, no murmur, distal pulses 2+ b/l Resp: Slight end expiratory wheeze bilaterally, normal WOB Neuro: Alert and oriented, strength equal b/l UE and LE, coordination grossly normal MSK: normal muscle bulk Skin:  1 mm  scabs over PIP joint and MCP joints.  No other breaks in the skin.  No surrounding redness.  No fluctuance.  Not able to bend his middle finger due to pain.  Able to move the other fingers hand.  Assessment & Plan:  57 year old with finger cellulitis of his left hand.  Diagnoses and all orders for this visit:  Cellulitis of finger of  left hand Here doctor's clinic.  Not able to get x-ray.  If not improving and for any worsening return to clinic, will refer to hand specialist given location. -     sulfamethoxazole-trimethoprim (BACTRIM DS,SEPTRA DS) 800-160 MG tablet; Take 1 tablet by mouth 2 (two) times daily.  Will go home and arrange treatment.  Follow up plan: Return if symptoms worsen or fail to improve. Assunta Found, MD [place]

## 2017-12-31 ENCOUNTER — Encounter: Payer: Self-pay | Admitting: Gastroenterology

## 2017-12-31 ENCOUNTER — Ambulatory Visit: Payer: Medicare Other | Admitting: Gastroenterology

## 2017-12-31 VITALS — BP 142/82 | HR 61 | Ht 71.0 in | Wt 313.2 lb

## 2017-12-31 DIAGNOSIS — J449 Chronic obstructive pulmonary disease, unspecified: Secondary | ICD-10-CM | POA: Diagnosis not present

## 2017-12-31 DIAGNOSIS — R195 Other fecal abnormalities: Secondary | ICD-10-CM | POA: Diagnosis not present

## 2017-12-31 DIAGNOSIS — E119 Type 2 diabetes mellitus without complications: Secondary | ICD-10-CM | POA: Diagnosis not present

## 2017-12-31 MED ORDER — PEG-KCL-NACL-NASULF-NA ASC-C 140 G PO SOLR: 140.0000 g | ORAL | 0 refills | Status: DC

## 2017-12-31 NOTE — Patient Instructions (Signed)
If you are age 56 or older, your body mass index should be between 23-30. Your Body mass index is 43.68 kg/m. If this is out of the aforementioned range listed, please consider follow up with your Primary Care Provider.  If you are age 45 or younger, your body mass index should be between 19-25. Your Body mass index is 43.68 kg/m. If this is out of the aformentioned range listed, please consider follow up with your Primary Care Provider.   You have been scheduled for a colonoscopy. Please follow written instructions given to you at your visit today.  Please pick up your prep supplies at the pharmacy within the next 1-3 days. If you use inhalers (even only as needed), please bring them with you on the day of your procedure. Your physician has requested that you go to www.startemmi.com and enter the access code given to you at your visit today. This web site gives a general overview about your procedure. However, you should still follow specific instructions given to you by our office regarding your preparation for the procedure.  It was a pleasure to meet you today!  Dr. Loletha Carrow

## 2017-12-31 NOTE — Progress Notes (Signed)
[facility] Consult Note:  History: Kurt Blevins 12/31/2017  Referring physician: Chevis Pretty, [place]  Reason for consult/chief complaint: Positive cologard Test (Patient has never had a colonoscopy but had a positive cologard test) and Family  Hx Of Colon Cancer (Family hx colon cancer in paternal grandmother)   Subjective  HPI:  This is a very pleasant 57 year old man referred by primary care for a positive Cologuard test.  That study was ordered after his last primary care visit in December 2018.  Patient has a family history of colon cancer in a paternal grandmother only.  He has some occasional blood on the paper after wiping when he has occasional constipation.  He denies chronic abdominal pain, frank rectal bleeding, early satiety, nausea, vomiting or dysphagia.   ROS:  Review of Systems  Constitutional: Negative for appetite change and unexpected weight change.  HENT: Negative for mouth sores and voice change.   Eyes: Negative for pain and redness.  Respiratory: Positive for cough and shortness of breath.   Cardiovascular: Negative for chest pain and palpitations.  Genitourinary: Negative for dysuria and hematuria.  Musculoskeletal: Positive for back pain. Negative for arthralgias and myalgias.  Skin: Negative for pallor and rash.  Neurological: Negative for weakness and headaches.  Hematological: Negative for adenopathy.     Past Medical History: Past Medical History:  Diagnosis Date  . COPD (chronic obstructive pulmonary disease) ([place])   . Hyperlipidemia   . Hypertension   . Neuropathy    patient reported     Past Surgical History: Past Surgical History:  Procedure Laterality Date  . BACK SURGERY  1990s  . HERNIA REPAIR    . NECK SURGERY    . TONSILLECTOMY       Family History: Family History  Problem Relation Age of Onset  . Diabetes Mother   . Hypertension Mother   . Hyperlipidemia Mother   . Arthritis Mother   .  Osteoporosis Mother   . Diabetes Father   . Hypertension Father   . Hyperlipidemia Father   . Heart attack Father 5       Late 45s and 66s  . Cancer Paternal Grandmother        colon  . Healthy Sister   . Healthy Brother   . Healthy Daughter   . Healthy Son   . Healthy Son   . Healthy Son     Social History: Social History   Socioeconomic History  . Marital status: Married    Spouse name: Not on file  . Number of children: 4  . Years of education: Not on file  . Highest education level: Not on file  Occupational History  . Occupation: Disabled    Comment: Back problems  Social Needs  . Financial resource strain: Not very hard  . Food insecurity:    Worry: Never true    Inability: Never true  . Transportation needs:    Medical: No    Non-medical: No  Tobacco Use  . Smoking status: Former Smoker    Types: Cigarettes    Last attempt to quit: 11/26/2008    Years since quitting: 9.1  . Smokeless tobacco: Never Used  Substance and Sexual Activity  . Alcohol use: No  . Drug use: No  . Sexual activity: Not on file  Lifestyle  . Physical activity:    Days per week: 7 days    Minutes per session: 20 min  . Stress: To some extent  Relationships  .  Social connections:    Talks on phone: More than three times a week    Gets together: More than three times a week    Attends religious service: Never    Active member of club or organization: No    Attends meetings of clubs or organizations: Never    Relationship status: Married  Other Topics Concern  . Not on file  Social History Narrative  . Not on file    Allergies: Allergies  Allergen Reactions  . Eggs Or Egg-Derived Products   . Peanut-Containing Drug Products   . Penicillins     Outpatient Meds: Current Outpatient Medications  Medication Sig Dispense Refill  . aspirin 81 MG tablet Take 81 mg by mouth daily.    Marland Kitchen atorvastatin (LIPITOR) 40 MG tablet Take 1 tablet (40 mg total) by mouth daily. 30 tablet 5   . fenofibrate 160 MG tablet Take 1 tablet (160 mg total) by mouth daily. 30 tablet 5  . ipratropium-albuterol (DUONEB) 0.5-2.5 (3) MG/3ML SOLN Take 3 mLs by nebulization 3 (three) times daily as needed. 180 mL 3  . lisinopril-hydrochlorothiazide (PRINZIDE,ZESTORETIC) 20-25 MG tablet Take 1 tablet by mouth daily. 30 tablet 5  . metFORMIN (GLUCOPHAGE) 1000 MG tablet Take 1 tablet (1,000 mg total) by mouth 2 (two) times daily with a meal. 180 tablet 1  . ONETOUCH DELICA LANCETS 24M MISC Check blood sugar 1x per day and prn  E 11.9 100 each 5  . ONETOUCH VERIO test strip CHECK BLOOD SUGAR ONCE DAILY AS DIRECTED. 100 each 2  . sulfamethoxazole-trimethoprim (BACTRIM DS,SEPTRA DS) 800-160 MG tablet Take 1 tablet by mouth 2 (two) times daily. 14 tablet 0  . umeclidinium-vilanterol (ANORO ELLIPTA) 62.5-25 MCG/INH AEPB Inhale 1 puff into the lungs daily. 4 each 0   No current facility-administered medications for this visit.       ___________________________________________________________________ Objective   Exam:  BP (!) 142/82   Pulse 61   Ht 5\' 11"  (1.803 m)   Wt (!) 313 lb 3.2 oz (142.1 kg)   SpO2 95%   BMI 43.68 kg/m    General: this is a(n) chronically ill-appearing man.  Breathing comfortably on room air, speaks full sentences  Eyes: sclera anicteric, no redness  ENT: oral mucosa moist without lesions, no cervical or supraclavicular lymphadenopathy, good dentition  CV: RRR without murmur, S1/S2, no JVD, no peripheral edema  Resp: Expiratory wheezing bilaterally, normal RR and effort noted  GI: soft, no tenderness, with active bowel sounds. No guarding or palpable organomegaly noted.+ rectus diastasis  Skin; warm and dry, no rash or jaundice noted  Neuro: awake, alert and oriented x 3. Normal gross motor function and fluent speech  Labs:  Positive cologuard 10/29/17  No CBC in system  Assessment: Encounter Diagnoses  Name Primary?  . Positive colorectal cancer  screening using Cologuard test Yes  . Type 2 diabetes mellitus without complication, without long-term current use of insulin ([place])   . Severe obesity (BMI >= 40) (HCC)   . COPD with asthma ([place])     We discussed the possible explanations of a positive Cologuard: Colorectal polyps, false-positive, colorectal cancer.  I advised him to have a colonoscopy, and he is agreeable after thorough discussion of procedure and risks.  The benefits and risks of the planned procedure were described in detail with the patient or (when appropriate) their health care proxy.  Risks were outlined as including, but not limited to, bleeding, infection, perforation, adverse medication reaction leading to cardiac  or pulmonary decompensation, or pancreatitis (if ERCP).  The limitation of incomplete mucosal visualization was also discussed.  No guarantees or warranties were given.  Patient at increased risk for cardiopulmonary complications of procedure due to medical comorbidities.  Although he is not on chronic oxygen, I have elected to do his procedure in the hospital outpatient endoscopy lab due to increased risk of sedation related complications from his COPD and BMI.  Thank you for the courtesy of this consult.  Please call me with any questions or concerns.  [name]  CC: Chevis Pretty, FNP

## 2018-01-02 ENCOUNTER — Encounter: Payer: Self-pay | Admitting: Nurse Practitioner

## 2018-01-02 ENCOUNTER — Ambulatory Visit (INDEPENDENT_AMBULATORY_CARE_PROVIDER_SITE_OTHER): Payer: Medicare Other | Admitting: Nurse Practitioner

## 2018-01-02 VITALS — BP 115/74 | HR 66 | Temp 97.4°F | Ht 71.0 in | Wt 312.0 lb

## 2018-01-02 DIAGNOSIS — E785 Hyperlipidemia, unspecified: Secondary | ICD-10-CM | POA: Diagnosis not present

## 2018-01-02 DIAGNOSIS — E782 Mixed hyperlipidemia: Secondary | ICD-10-CM | POA: Diagnosis not present

## 2018-01-02 DIAGNOSIS — J449 Chronic obstructive pulmonary disease, unspecified: Secondary | ICD-10-CM

## 2018-01-02 DIAGNOSIS — I10 Essential (primary) hypertension: Secondary | ICD-10-CM

## 2018-01-02 DIAGNOSIS — E119 Type 2 diabetes mellitus without complications: Secondary | ICD-10-CM

## 2018-01-02 LAB — BAYER DCA HB A1C WAIVED: HB A1C: 7.1 % — AB (ref <7.0)

## 2018-01-02 MED ORDER — ATORVASTATIN CALCIUM 40 MG PO TABS: 40.0000 mg | ORAL_TABLET | Freq: Every day | ORAL | 1 refills | Status: DC

## 2018-01-02 MED ORDER — UMECLIDINIUM-VILANTEROL 62.5-25 MCG/INH IN AEPB: 1.0000 | INHALATION_SPRAY | Freq: Every day | RESPIRATORY_TRACT | 3 refills | Status: DC

## 2018-01-02 MED ORDER — IPRATROPIUM-ALBUTEROL 0.5-2.5 (3) MG/3ML IN SOLN: 3.0000 mL | Freq: Three times a day (TID) | RESPIRATORY_TRACT | 3 refills | Status: DC | PRN

## 2018-01-02 MED ORDER — LISINOPRIL-HYDROCHLOROTHIAZIDE 20-25 MG PO TABS: 1.0000 | ORAL_TABLET | Freq: Every day | ORAL | 1 refills | Status: DC

## 2018-01-02 MED ORDER — FENOFIBRATE 160 MG PO TABS: 160.0000 mg | ORAL_TABLET | Freq: Every day | ORAL | 1 refills | Status: DC

## 2018-01-02 NOTE — Progress Notes (Signed)
Subjective:    Patient ID: Kurt Blevins, male    DOB: 1961/07/19, 57 y.o.   MRN: [id number]   Chief Complaint: Medical Management of Chronic Issues (Recheck finger)   HPI: 120-160 1. Type 2 diabetes mellitus without complication, without long-term current use of insulin (HCC) last hgba1c was 7.1. Blood sugars running around 120-160. Doe snot check blod sugar every day. no hypoglycemia that he is aware of.   2. Hyperlipidemia, unspecified hyperlipidemia type  He does not watch diet very closely. Not much exercise because he gets SOB.  3. Essential hypertension  No chest pain, increasing SOB or headaches. Does not check blood pressure at home.  4. COPD with asthma ([place])  Does not see pulmonologist. Is on anoro and duoneb- has occasional cough. Overall he thinks he is doing ok.  5. Severe obesity (BMI >= 40) (HCC)  No recent weight changes.    Outpatient Encounter Medications as of 01/02/2018  Medication Sig  . aspirin 81 MG tablet Take 81 mg by mouth daily.  Marland Kitchen atorvastatin (LIPITOR) 40 MG tablet Take 1 tablet (40 mg total) by mouth daily.  . fenofibrate 160 MG tablet Take 1 tablet (160 mg total) by mouth daily.  Marland Kitchen ipratropium-albuterol (DUONEB) 0.5-2.5 (3) MG/3ML SOLN Take 3 mLs by nebulization 3 (three) times daily as needed.  Marland Kitchen lisinopril-hydrochlorothiazide (PRINZIDE,ZESTORETIC) 20-25 MG tablet Take 1 tablet by mouth daily.  . metFORMIN (GLUCOPHAGE) 1000 MG tablet Take 1 tablet (1,000 mg total) by mouth 2 (two) times daily with a meal.  . ONETOUCH DELICA LANCETS 75I MISC Check blood sugar 1x per day and prn  E 11.9  . ONETOUCH VERIO test strip CHECK BLOOD SUGAR ONCE DAILY AS DIRECTED.  Marland Kitchen PEG-KCl-NaCl-NaSulf-Na Asc-C (PLENVU) 140 g SOLR Take 140 g by mouth as directed.  . umeclidinium-vilanterol (ANORO ELLIPTA) 62.5-25 MCG/INH AEPB Inhale 1 puff into the lungs daily.    New complaints: None today  Social history: On disability for his breathing. 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    Review of Systems  Constitutional: Negative for activity change and appetite change.  HENT: Negative.   Eyes: Negative for pain.  Respiratory: Negative for shortness of breath.   Cardiovascular: Negative for chest pain, palpitations and leg swelling.  Gastrointestinal: Negative for abdominal pain.  Endocrine: Negative for polydipsia.  Genitourinary: Negative.   Skin: Negative for rash.  Neurological: Negative for dizziness, weakness and headaches.  Hematological: Does not bruise/bleed easily.  Psychiatric/Behavioral: Negative.   All other systems reviewed and are negative.      Objective:   Physical Exam  Constitutional: He is oriented to person, place, and time. He appears well-developed and well-nourished. No distress.  HENT:  Head: Normocephalic.  Nose: Nose normal.  Mouth/Throat: Oropharynx is clear and moist.  Eyes: Pupils are equal, round, and reactive to light. EOM are normal.  Neck: Normal range of motion and phonation normal. Neck supple. No JVD present. Carotid bruit is not present. No thyroid mass and no thyromegaly present.  Cardiovascular: Normal rate and regular rhythm.  Pulmonary/Chest: Effort normal and breath sounds normal. No respiratory distress.  Abdominal: Soft. Normal appearance, normal aorta and bowel sounds are normal. There is no tenderness.  Abdominal wall hernia  Musculoskeletal: Normal range of motion.  Lymphadenopathy:    He has no cervical adenopathy.  Neurological: He is alert and oriented to person, place, and time.  Skin: Skin is warm and dry.  Psychiatric: He has a normal mood and affect. His behavior  is normal. Judgment and thought content normal.   BP 115/74   Pulse 66   Temp (!) 97.4 F (36.3 C) (Oral)   Ht '5\' 11"'  (1.803 m)   Wt (!) 312 lb (141.5 kg)   BMI 43.52 kg/m   hgba1c 7.1      Assessment & Plan:  Kurt Blevins comes in today with chief complaint of Medical Management of Chronic Issues (Recheck  finger)   Diagnosis and orders addressed:  1. Type 2 diabetes mellitus without complication, without long-term current use of insulin (HCC) Continue to watch carbs in diet - Bayer DCA Hb A1c Waived - Microalbumin / creatinine urine ratio  2. Mixed hyperlipidemia Low fat diet - atorvastatin (LIPITOR) 40 MG tablet; Take 1 tablet (40 mg total) by mouth daily.  Dispense: 90 tablet; Refill: 1 - fenofibrate 160 MG tablet; Take 1 tablet (160 mg total) by mouth daily.  Dispense: 90 tablet; Refill: 1 - Lipid panel  3. Essential hypertension Low sodium diet - CMP14+EGFR - lisinopril-hydrochlorothiazide (PRINZIDE,ZESTORETIC) 20-25 MG tablet; Take 1 tablet by mouth daily.  Dispense: 90 tablet; Refill: 1  4. COPD with asthma [facility]) Avoid cigarette smoke 'dont be outside if really hot or humid - umeclidinium-vilanterol (ANORO ELLIPTA) 62.5-25 MCG/INH AEPB; Inhale 1 puff into the lungs daily.  Dispense: 4 each; Refill: 3 - ipratropium-albuterol (DUONEB) 0.5-2.5 (3) MG/3ML SOLN; Take 3 mLs by nebulization 3 (three) times daily as needed.  Dispense: 180 mL; Refill: 3  5. Severe obesity (BMI >= 40) (HCC) Discussed diet and exercise for person with BMI >25 Will recheck weight in 3-6 months  Colonoscopy schedule in 7 weeks Needs eye exam Labs pending Health Maintenance reviewed Diet and exercise encouraged  Follow up plan: 3 months   Mary-Margaret Hassell Done, FNP

## 2018-01-02 NOTE — Patient Instructions (Signed)

## 2018-01-03 LAB — LIPID PANEL
CHOL/HDL RATIO: 3.8 ratio (ref 0.0–5.0)
CHOLESTEROL TOTAL: 129 mg/dL (ref 100–199)
HDL: 34 mg/dL — ABNORMAL LOW (ref >39)
LDL CALC: 45 mg/dL (ref 0–99)
Triglycerides: 249 mg/dL — ABNORMAL HIGH (ref 0–149)
VLDL CHOLESTEROL CAL: 50 mg/dL — AB (ref 5–40)

## 2018-01-03 LAB — CMP14+EGFR
ALBUMIN: 4.4 g/dL (ref 3.5–5.5)
ALK PHOS: 56 IU/L (ref 39–117)
ALT: 23 IU/L (ref 0–44)
AST: 19 IU/L (ref 0–40)
Albumin/Globulin Ratio: 2.1 (ref 1.2–2.2)
BUN / CREAT RATIO: 18 (ref 9–20)
BUN: 18 mg/dL (ref 6–24)
Bilirubin Total: 0.2 mg/dL (ref 0.0–1.2)
CALCIUM: 10.5 mg/dL — AB (ref 8.7–10.2)
CO2: 24 mmol/L (ref 20–29)
CREATININE: 1 mg/dL (ref 0.76–1.27)
Chloride: 102 mmol/L (ref 96–106)
GFR calc Af Amer: 97 mL/min/{1.73_m2} (ref >59)
GFR, EST NON AFRICAN AMERICAN: 84 mL/min/{1.73_m2} (ref >59)
GLOBULIN, TOTAL: 2.1 g/dL (ref 1.5–4.5)
GLUCOSE: 63 mg/dL — AB (ref 65–99)
Potassium: 4.2 mmol/L (ref 3.5–5.2)
SODIUM: 143 mmol/L (ref 134–144)
Total Protein: 6.5 g/dL (ref 6.0–8.5)

## 2018-01-06 ENCOUNTER — Ambulatory Visit: Payer: Medicare Other | Admitting: Nurse Practitioner

## 2018-01-10 ENCOUNTER — Telehealth: Payer: Self-pay | Admitting: Gastroenterology

## 2018-01-13 NOTE — Telephone Encounter (Signed)
Pt. aware to come to the office and pick up a Plenvu sample kit.

## 2018-01-16 ENCOUNTER — Encounter: Payer: Self-pay | Admitting: *Deleted

## 2018-02-24 ENCOUNTER — Other Ambulatory Visit: Payer: Self-pay

## 2018-02-24 ENCOUNTER — Encounter (HOSPITAL_COMMUNITY): Payer: Self-pay

## 2018-02-25 ENCOUNTER — Ambulatory Visit (HOSPITAL_COMMUNITY)
Admission: RE | Admit: 2018-02-25 | Discharge: 2018-02-25 | Disposition: A | Discharge status: Home / Self Care | Payer: Medicare Other | Source: Ambulatory Visit | Attending: Gastroenterology | Admitting: Gastroenterology

## 2018-02-25 ENCOUNTER — Ambulatory Visit (HOSPITAL_COMMUNITY): Payer: Medicare Other | Admitting: Anesthesiology

## 2018-02-25 ENCOUNTER — Encounter (HOSPITAL_COMMUNITY)
Admission: RE | Disposition: A | Discharge status: Home / Self Care | Payer: Self-pay | Source: Ambulatory Visit | Attending: Gastroenterology

## 2018-02-25 ENCOUNTER — Other Ambulatory Visit: Payer: Self-pay

## 2018-02-25 ENCOUNTER — Encounter (HOSPITAL_COMMUNITY): Payer: Self-pay | Admitting: *Deleted

## 2018-02-25 DIAGNOSIS — G629 Polyneuropathy, unspecified: Secondary | ICD-10-CM | POA: Insufficient documentation

## 2018-02-25 DIAGNOSIS — D122 Benign neoplasm of ascending colon: Secondary | ICD-10-CM | POA: Insufficient documentation

## 2018-02-25 DIAGNOSIS — Z91010 Allergy to peanuts: Secondary | ICD-10-CM | POA: Diagnosis not present

## 2018-02-25 DIAGNOSIS — K5730 Diverticulosis of large intestine without perforation or abscess without bleeding: Secondary | ICD-10-CM | POA: Insufficient documentation

## 2018-02-25 DIAGNOSIS — J449 Chronic obstructive pulmonary disease, unspecified: Secondary | ICD-10-CM

## 2018-02-25 DIAGNOSIS — I10 Essential (primary) hypertension: Secondary | ICD-10-CM | POA: Diagnosis not present

## 2018-02-25 DIAGNOSIS — Z6841 Body Mass Index (BMI) 40.0 and over, adult: Secondary | ICD-10-CM | POA: Insufficient documentation

## 2018-02-25 DIAGNOSIS — D124 Benign neoplasm of descending colon: Secondary | ICD-10-CM | POA: Diagnosis not present

## 2018-02-25 DIAGNOSIS — D123 Benign neoplasm of transverse colon: Secondary | ICD-10-CM | POA: Diagnosis not present

## 2018-02-25 DIAGNOSIS — Z91012 Allergy to eggs: Secondary | ICD-10-CM | POA: Diagnosis not present

## 2018-02-25 DIAGNOSIS — K921 Melena: Secondary | ICD-10-CM | POA: Diagnosis not present

## 2018-02-25 DIAGNOSIS — Z87891 Personal history of nicotine dependence: Secondary | ICD-10-CM | POA: Insufficient documentation

## 2018-02-25 DIAGNOSIS — E785 Hyperlipidemia, unspecified: Secondary | ICD-10-CM | POA: Diagnosis not present

## 2018-02-25 DIAGNOSIS — Z880 Allergy status to penicillin: Secondary | ICD-10-CM | POA: Diagnosis not present

## 2018-02-25 DIAGNOSIS — E119 Type 2 diabetes mellitus without complications: Secondary | ICD-10-CM

## 2018-02-25 DIAGNOSIS — R195 Other fecal abnormalities: Secondary | ICD-10-CM

## 2018-02-25 DIAGNOSIS — D120 Benign neoplasm of cecum: Secondary | ICD-10-CM | POA: Insufficient documentation

## 2018-02-25 HISTORY — PX: POLYPECTOMY: SHX5525

## 2018-02-25 HISTORY — PX: COLONOSCOPY: SHX5424

## 2018-02-25 LAB — GLUCOSE, CAPILLARY: Glucose-Capillary: 162 mg/dL — ABNORMAL HIGH (ref 70–99)

## 2018-02-25 SURGERY — COLONOSCOPY
Anesthesia: Monitor Anesthesia Care

## 2018-02-25 MED ORDER — PROPOFOL 10 MG/ML IV BOLUS
INTRAVENOUS | Status: AC
  Filled 2018-02-25: qty 20

## 2018-02-25 MED ORDER — PROPOFOL 500 MG/50ML IV EMUL
INTRAVENOUS | Status: DC | PRN
  Administered 2018-02-25: 125 ug/kg/min via INTRAVENOUS

## 2018-02-25 MED ORDER — PROPOFOL 10 MG/ML IV BOLUS
INTRAVENOUS | Status: DC | PRN
  Administered 2018-02-25 (×2): 30 mg via INTRAVENOUS

## 2018-02-25 MED ORDER — PROPOFOL 10 MG/ML IV BOLUS
INTRAVENOUS | Status: AC
  Filled 2018-02-25: qty 60

## 2018-02-25 MED ORDER — SODIUM CHLORIDE 0.9 % IV SOLN: INTRAVENOUS | Status: DC

## 2018-02-25 MED ORDER — PROPOFOL 10 MG/ML IV BOLUS
INTRAVENOUS | Status: AC
Start: 2018-02-25 — End: ?
  Filled 2018-02-25: qty 20

## 2018-02-25 MED ORDER — LACTATED RINGERS IV SOLN
INTRAVENOUS | Status: DC
  Administered 2018-02-25: 1000 mL via INTRAVENOUS

## 2018-02-25 NOTE — Transfer of Care (Signed)
Immediate Anesthesia Transfer of Care Note  Patient: Kurt Blevins  Procedure(s) Performed: COLONOSCOPY (N/A ) POLYPECTOMY  Patient Location: PACU  Anesthesia Type:MAC  Level of Consciousness: sedated  Airway & Oxygen Therapy: Patient Spontanous Breathing and Patient connected to nasal cannula oxygen  Post-op Assessment: Report given to RN and Post -op Vital signs reviewed and stable  Post vital signs: Reviewed and stable  Last Vitals:  Vitals Value Taken Time  BP    Temp    Pulse    Resp    SpO2      Last Pain:  Vitals:   02/25/18 0903  TempSrc: Oral  PainSc: 0-No pain         Complications: No apparent anesthesia complications

## 2018-02-25 NOTE — Anesthesia Preprocedure Evaluation (Signed)
Anesthesia Evaluation  Patient identified by MRN, date of birth, ID band Patient awake    Reviewed: Allergy & Precautions, NPO status , Patient's Chart, lab work & pertinent test results  Airway Mallampati: II  TM Distance: >3 FB Neck ROM: Full    Dental no notable dental hx.    Pulmonary asthma , COPD,  COPD inhaler, former smoker,    Pulmonary exam normal breath sounds clear to auscultation       Cardiovascular hypertension, Normal cardiovascular exam Rhythm:Regular Rate:Normal     Neuro/Psych negative neurological ROS  negative psych ROS   GI/Hepatic negative GI ROS, Neg liver ROS,   Endo/Other  diabetesMorbid obesity  Renal/GU negative Renal ROS  negative genitourinary   Musculoskeletal negative musculoskeletal ROS (+)   Abdominal   Peds negative pediatric ROS (+)  Hematology negative hematology ROS (+)   Anesthesia Other Findings   Reproductive/Obstetrics negative OB ROS                             Anesthesia Physical Anesthesia Plan  ASA: III  Anesthesia Plan: MAC   Post-op Pain Management:    Induction: Intravenous  PONV Risk Score and Plan: 0  Airway Management Planned: Simple Face Mask  Additional Equipment:   Intra-op Plan:   Post-operative Plan:   Informed Consent: I have reviewed the patients History and Physical, chart, labs and discussed the procedure including the risks, benefits and alternatives for the proposed anesthesia with the patient or authorized representative who has indicated his/her understanding and acceptance.   Dental advisory given  Plan Discussed with: CRNA and Surgeon  Anesthesia Plan Comments:         Anesthesia Quick Evaluation

## 2018-02-25 NOTE — H&P (Signed)
History:  This patient presents for endoscopic testing for positive cologuard test.  [name] Referring physician: Chevis Pretty, FNP  Past Medical History: Past Medical History:  Diagnosis Date  . COPD (chronic obstructive pulmonary disease) ([place])   . Hyperlipidemia   . Hypertension   . Neuropathy    patient reported     Past Surgical History: Past Surgical History:  Procedure Laterality Date  . BACK SURGERY  1990s  . HERNIA REPAIR    . NECK SURGERY    . TONSILLECTOMY      Allergies: Allergies  Allergen Reactions  . Penicillins Anaphylaxis and Other (See Comments)    [place] Has patient had a PCN reaction causing immediate rash, facial/tongue/throat swelling, SOB or lightheadedness with hypotension: Yes Has patient had a PCN reaction causing severe rash involving mucus membranes or skin necrosis: Yes Has patient had a PCN reaction that required hospitalization: Yes Has patient had a PCN reaction occurring within the last 10 years: No If all of the above answers are "NO", then may proceed with Cephalosporin use.   . Eggs Or Egg-Derived Products Nausea Only and Other (See Comments)    Stomach cramps  . Peanut-Containing Drug Products Hives and Itching    Outpatient Meds: Current Facility-Administered Medications  Medication Dose Route Frequency Provider Last Rate Last Dose  . 0.9 %  sodium chloride infusion   Intravenous Continuous Danis, Estill Cotta III, MD      . lactated ringers infusion   Intravenous Continuous [name], MD 20 mL/hr at 02/25/18 0919 1,000 mL at 02/25/18 0919      ___________________________________________________________________ Objective   Exam:  BP (!) 159/75   Pulse 78   Temp 97.7 F (36.5 C) (Oral)   Resp (!) 78   Ht 5\' 11"  (1.803 m)   Wt (!) 312 lb (141.5 kg)   SpO2 97%   BMI 43.52 kg/m    CV: RRR without murmur, S1/S2, no JVD, no peripheral edema  Resp: clear to auscultation bilaterally, normal RR and  effort noted  GI: soft, no tenderness, with active bowel sounds. No guarding or palpable organomegaly noted.  Neuro: awake, alert and oriented x 3. Normal gross motor function and fluent speech   Assessment:  Positive cologuard test  Plan:  colonoscopy   [name]

## 2018-02-25 NOTE — Interval H&P Note (Signed)
History and Physical Interval Note:  02/25/2018 9:40 AM  Kurt Blevins  has presented today for surgery, with the diagnosis of Positive cologuard  The various methods of treatment have been discussed with the patient and family. After consideration of risks, benefits and other options for treatment, the patient has consented to  Procedure(s): COLONOSCOPY (N/A) as a surgical intervention .  The patient's history has been reviewed, patient examined, no change in status, stable for surgery.  I have reviewed the patient's chart and labs.  Questions were answered to the patient's satisfaction.     [name]

## 2018-02-25 NOTE — Op Note (Signed)
[facility] Patient Name: Kurt Blevins Procedure Date: 02/25/2018 MRN: [id number] Attending MD: Estill Cotta. Loletha Carrow , MD Date of Birth: November 12, 1960 CSN: [id number] Age: 57 Admit Type: Outpatient Procedure:                Colonoscopy Indications:              Positive Cologuard test Providers:                Mallie Mussel L. Loletha Carrow, MD, Angus Seller, William Dalton, Technician Referring MD:              Medicines:                Monitored Anesthesia Care Complications:            No immediate complications. Estimated Blood Loss:     Estimated blood loss was minimal. Procedure:                Pre-Anesthesia Assessment:                           - Prior to the procedure, a History and Physical                            was performed, and patient medications and                            allergies were reviewed. The patient's tolerance of                            previous anesthesia was also reviewed. The risks                            and benefits of the procedure and the sedation                            options and risks were discussed with the patient.                            All questions were answered, and informed consent                            was obtained. Anticoagulants: The patient has taken                            aspirin. It was decided not to withhold this                            medication prior to the procedure. ASA Grade                            Assessment: III - A patient with severe systemic  disease. After reviewing the risks and benefits,                            the patient was deemed in satisfactory condition to                            undergo the procedure.                           After obtaining informed consent, the colonoscope                            was passed under direct vision. Throughout the                            procedure, the patient's blood pressure, pulse, and                             oxygen saturations were monitored continuously. The                            CF-HQ190L ([id number] Olympus adult colonoscope was                            introduced through the anus and advanced to the the                            cecum, identified by appendiceal orifice and                            ileocecal valve. The colonoscopy was performed with                            difficulty due to significant looping and the                            patient's body habitus and spastocity. [Solution].                            The quality of the bowel preparation was excellent. Scope In: 9:54:03 AM Scope Out: 11:01:44 AM Scope Withdrawal Time: 0 hours 59 minutes 52 seconds  Total Procedure Duration: 1 hour 7 minutes 41 seconds  Findings:      The perianal and digital rectal examinations were normal.      Eight sessile polyps were found in the proximal ascending colon and       cecum. The polyps were 2 to 3 mm in size. These polyps were removed with       a cold snare. Resection and retrieval were complete. (Jar 1)      A 8 mm polyp was found in the proximal ascending colon. The polyp was       semi-pedunculated. The polyp was removed with a hot snare. Resection and       retrieval were complete. (Jar 2)      Two sessile polyps were found in the hepatic flexure.  The polyps were 6       to 8 mm in size. These polyps were removed with a cold snare. Resection       and retrieval were complete. (Jar 3)      Four sessile polyps were found in the transverse colon. The polyps were       6 to 12 mm in size. Three of these polyps were removed with a cold       snare, and the other removed with a hot snare. Resection and retrieval       were complete. (Jar 4)      A 20 mm polyp was found in the distal transverse colon. The polyp was       sessile. The polyp was removed with a hot snare. Resection and retrieval       were complete.      Three sessile polyps were  found in the descending colon. The polyps were       4 to 8 mm in size. These polyps were removed with a cold snare.       Resection and retrieval were complete.      Multiple small-mouthed diverticula were found in the left colon.      The exam was otherwise without abnormality on direct and retroflexion       views. Impression:               - Eight 2 to 3 mm polyps in the proximal ascending                            colon and in the cecum, removed with a cold snare.                            Resected and retrieved.                           - One 8 mm polyp in the proximal ascending colon,                            removed with a hot snare. Resected and retrieved.                           - Two 6 to 8 mm polyps at the hepatic flexure,                            removed with a cold snare. Resected and retrieved.                           - Four 6 to 12 mm polyps in the transverse colon,                            removed with a cold snare. Resected and retrieved.                           - One 20 mm polyp in the distal transverse colon,  removed with a hot snare. Resected and retrieved.                           - Three 4 to 8 mm polyps in the descending colon,                            removed with a cold snare. Resected and retrieved.                           - Diverticulosis in the left colon.                           - The examination was otherwise normal on direct                            and retroflexion views. Moderate Sedation:      MAC sedation used Recommendation:           - Patient has a contact number available for                            emergencies. The signs and symptoms of potential                            delayed complications were discussed with the                            patient. Return to normal activities tomorrow.                            Written discharge instructions were provided to the                             patient.                           - Resume previous diet.                           - No aspirin, ibuprofen, naproxen, or other                            non-steroidal anti-inflammatory drugs for 7 days                            after polyp removal.                           - Await pathology results.                           - Repeat colonoscopy is recommended for                            surveillance. The colonoscopy date will be  determined after pathology results from today's                            exam become available for review. Procedure Code(s):        --- Professional ---                           [id number], Colonoscopy, flexible; with removal of                            tumor(s), polyp(s), or other lesion(s) by snare                            technique Diagnosis Code(s):        --- Professional ---                           D12.2, Benign neoplasm of ascending colon                           D12.0, Benign neoplasm of cecum                           D12.3, Benign neoplasm of transverse colon (hepatic                            flexure or splenic flexure)                           D12.4, Benign neoplasm of descending colon                           R19.5, Other fecal abnormalities                           K57.30, Diverticulosis of large intestine without                            perforation or abscess without bleeding CPT copyright 2017 American Medical Association. All rights reserved. The codes documented in this report are preliminary and upon coder review may  be revised to meet current compliance requirements. Henry L. Loletha Carrow, MD 02/25/2018 11:14:35 AM This report has been signed electronically. Number of Addenda: 0

## 2018-02-25 NOTE — Anesthesia Postprocedure Evaluation (Signed)
Anesthesia Post Note  Patient: Kurt Blevins  Procedure(s) Performed: COLONOSCOPY (N/A ) POLYPECTOMY     Patient location during evaluation: PACU Anesthesia Type: MAC Level of consciousness: awake and alert Pain management: pain level controlled Vital Signs Assessment: post-procedure vital signs reviewed and stable Respiratory status: spontaneous breathing, nonlabored ventilation, respiratory function stable and patient connected to nasal cannula oxygen Cardiovascular status: stable and blood pressure returned to baseline Postop Assessment: no apparent nausea or vomiting Anesthetic complications: no    Last Vitals:  Vitals:   02/25/18 1109 02/25/18 1120  BP: 99/77 118/78  Pulse: 72 70  Resp: 19 14  Temp: 36.6 C   SpO2: 96% 95%    Last Pain:  Vitals:   02/25/18 1120  TempSrc:   PainSc: 0-No pain                 Noelia Lenart S

## 2018-02-25 NOTE — Discharge Instructions (Signed)

## 2018-02-26 ENCOUNTER — Encounter (HOSPITAL_COMMUNITY): Payer: Self-pay | Admitting: Gastroenterology

## 2018-02-28 ENCOUNTER — Encounter: Payer: Self-pay | Admitting: Gastroenterology

## 2018-03-14 ENCOUNTER — Telehealth: Payer: Self-pay | Admitting: Nurse Practitioner

## 2018-03-14 NOTE — Telephone Encounter (Signed)
Yes per MMM, daughter aware

## 2018-03-14 NOTE — Telephone Encounter (Signed)
Pt daughter wants to know if pt can soak feet in epson salt since he is a diabetic they wanted to check and make sure

## 2018-04-02 ENCOUNTER — Ambulatory Visit (INDEPENDENT_AMBULATORY_CARE_PROVIDER_SITE_OTHER): Payer: Medicare Other | Admitting: Nurse Practitioner

## 2018-04-02 ENCOUNTER — Encounter: Payer: Self-pay | Admitting: Nurse Practitioner

## 2018-04-02 VITALS — BP 137/89 | HR 73 | Temp 97.0°F | Ht 71.0 in | Wt 322.2 lb

## 2018-04-02 DIAGNOSIS — E1142 Type 2 diabetes mellitus with diabetic polyneuropathy: Secondary | ICD-10-CM | POA: Diagnosis not present

## 2018-04-02 DIAGNOSIS — J449 Chronic obstructive pulmonary disease, unspecified: Secondary | ICD-10-CM

## 2018-04-02 DIAGNOSIS — E119 Type 2 diabetes mellitus without complications: Secondary | ICD-10-CM | POA: Diagnosis not present

## 2018-04-02 DIAGNOSIS — E1140 Type 2 diabetes mellitus with diabetic neuropathy, unspecified: Secondary | ICD-10-CM

## 2018-04-02 DIAGNOSIS — I10 Essential (primary) hypertension: Secondary | ICD-10-CM | POA: Diagnosis not present

## 2018-04-02 DIAGNOSIS — E782 Mixed hyperlipidemia: Secondary | ICD-10-CM

## 2018-04-02 DIAGNOSIS — R195 Other fecal abnormalities: Secondary | ICD-10-CM

## 2018-04-02 LAB — BAYER DCA HB A1C WAIVED: HB A1C (BAYER DCA - WAIVED): 8.1 % — ABNORMAL HIGH (ref <7.0)

## 2018-04-02 MED ORDER — GLIPIZIDE 10 MG PO TABS: 10.0000 mg | ORAL_TABLET | Freq: Two times a day (BID) | ORAL | 1 refills | Status: DC

## 2018-04-02 NOTE — Progress Notes (Signed)
Subjective:    Patient ID: Kurt Blevins, male    DOB: 1960-09-03, 57 y.o.   MRN: [id number]   Chief Complaint: Medical Management of Chronic Issues  HPI:  1. Type 2 diabetes mellitus without complication, without long-term current use of insulin ([place]) last hgba1c was 7.1%. Fasting blood sugars have been running around 140-180. He tries to watch his diet.   2. Essential hypertension  No c/o chest pain, sob or headache. Does not check blood pressure at home. BP Readings from Last 3 Encounters:  04/02/18 137/89  02/25/18 118/78  01/02/18 115/74     3. Mixed hyperlipidemia  Takes lipitor and fenofibrate daily. Not really watching diet. Very little exercise.  4. COPD with asthma ([place])  Still has some SOB. Perfumes really bother him. Uses his symbicort daily  5. Severe obesity (BMI >= 40) (HCC)  Weight is up 10lbs from last check  6. Positive colorectal cancer screening using Cologuard test  Had colonoscopy a month ago and they removed 19 precancerous polyps. He is to repeat it in 1 year.    Outpatient Encounter Medications as of 04/02/2018  Medication Sig  . aspirin 81 MG tablet Take 81 mg by mouth daily.  Marland Kitchen atorvastatin (LIPITOR) 40 MG tablet Take 1 tablet (40 mg total) by mouth daily.  . budesonide-formoterol (SYMBICORT) 160-4.5 MCG/ACT inhaler Inhale 2 puffs into the lungs 2 (two) times daily as needed (for shortness of breath).  . fenofibrate 160 MG tablet Take 1 tablet (160 mg total) by mouth daily.  Marland Kitchen glipiZIDE (GLUCOTROL) 5 MG tablet Take 5 mg by mouth 2 (two) times daily before a meal.   . ipratropium-albuterol (DUONEB) 0.5-2.5 (3) MG/3ML SOLN Take 3 mLs by nebulization 3 (three) times daily as needed. (Patient taking differently: Take 3 mLs by nebulization 3 (three) times daily as needed (for shortness of breath). )  . lisinopril-hydrochlorothiazide (PRINZIDE,ZESTORETIC) 20-25 MG tablet Take 1 tablet by mouth daily.  . metFORMIN (GLUCOPHAGE) 1000 MG tablet Take 1 tablet  (1,000 mg total) by mouth 2 (two) times daily with a meal.  . ONETOUCH DELICA LANCETS 09T MISC Check blood sugar 1x per day and prn  E 11.9  . ONETOUCH VERIO test strip CHECK BLOOD SUGAR ONCE DAILY AS DIRECTED.  Marland Kitchen umeclidinium-vilanterol (ANORO ELLIPTA) 62.5-25 MCG/INH AEPB Inhale 1 puff into the lungs daily.     New complaints: C/O bil foot pain. They feel sore when walking.  Social history: Lives with wife   Review of Systems  Constitutional: Negative for activity change and appetite change.  HENT: Negative.   Eyes: Negative for pain.  Respiratory: Negative for shortness of breath.   Cardiovascular: Positive for leg swelling (mild). Negative for chest pain and palpitations.  Gastrointestinal: Negative for abdominal pain.  Endocrine: Negative for polydipsia.  Genitourinary: Negative.   Musculoskeletal: Positive for myalgias (bil feet).  Skin: Negative for rash.  Neurological: Negative for dizziness, weakness and headaches.  Hematological: Does not bruise/bleed easily.  Psychiatric/Behavioral: Negative.   All other systems reviewed and are negative.      Objective:   Physical Exam  Constitutional: He is oriented to person, place, and time. He appears well-developed and well-nourished.  HENT:  Head: Normocephalic.  Nose: Nose normal.  Mouth/Throat: Oropharynx is clear and moist.  Eyes: Pupils are equal, round, and reactive to light. EOM are normal.  Neck: Normal range of motion and phonation normal. Neck supple. No JVD present. Carotid bruit is not present. No thyroid mass and no thyromegaly  present.  Cardiovascular: Normal rate and regular rhythm.  Pulmonary/Chest: Effort normal. No respiratory distress. He has wheezes (insp and exp throughout with diminished breath sounds in bil bases).  Abdominal: Soft. Normal appearance, normal aorta and bowel sounds are normal. There is no tenderness.  Musculoskeletal: Normal range of motion. He exhibits edema (1+ edema bil lower ext).   Lymphadenopathy:    He has no cervical adenopathy.  Neurological: He is alert and oriented to person, place, and time.  Skin: Skin is warm and dry.  Psychiatric: He has a normal mood and affect. His behavior is normal. Judgment and thought content normal.  Nursing note and vitals reviewed.   BP 137/89   Pulse 73   Temp (!) 97 F (36.1 C) (Oral)   Ht 5' 11" (1.803 m)   Wt (!) 322 lb 3.2 oz (146.1 kg)   BMI 44.94 kg/m   hgba1c 8.1%    Assessment & Plan:  Kurt Blevins comes in today with chief complaint of Diabetes (three month recheck)   Diagnosis and orders addressed:  1. Type 2 diabetes mellitus with diabetic polyneuropathy, without long-term current use of insulin (HCC) Stricter carb counting Increase glipizide to 81m bid - Bayer DCA Hb A1c Waived - glipiZIDE (GLUCOTROL) 10 MG tablet; Take 1 tablet (10 mg total) by mouth 2 (two) times daily before a meal.  Dispense: 180 tablet; Refill: 1  2. Essential hypertension Low sodium diet - CMP14+EGFR  3. Mixed hyperlipidemia Low fat diet - Lipid panel  4. COPD with asthma ([place] Continue inhalers Avoid irritants  5. Severe obesity (BMI >= 40) (HCC) Discussed diet and exercise for person with BMI >25 Will recheck weight in 3-6 months  6. Positive colorectal cancer screening using Cologuard test Had colonoscopy  7. Neuropathy due to type 2 diabetes mellitus ([facility] Patient does not want to take anything right now   Labs pending Health Maintenance reviewed Diet and exercise encouraged  Follow up plan: 3 months   MBarahona FNP

## 2018-04-02 NOTE — Patient Instructions (Signed)
Diabetes Mellitus and Nutrition When you have diabetes (diabetes mellitus), it is very important to have healthy eating habits because your blood sugar (glucose) levels are greatly affected by what you eat and drink. Eating healthy foods in the appropriate amounts, at about the same times every day, can help you:  Control your blood glucose.  Lower your risk of heart disease.  Improve your blood pressure.  Reach or maintain a healthy weight.  Every person with diabetes is different, and each person has different needs for a meal plan. Your health care provider may recommend that you work with a diet and nutrition specialist (dietitian) to make a meal plan that is best for you. Your meal plan may vary depending on factors such as:  The calories you need.  The medicines you take.  Your weight.  Your blood glucose, blood pressure, and cholesterol levels.  Your activity level.  Other health conditions you have, such as heart or kidney disease.  How do carbohydrates affect me? Carbohydrates affect your blood glucose level more than any other type of food. Eating carbohydrates naturally increases the amount of glucose in your blood. Carbohydrate counting is a method for keeping track of how many carbohydrates you eat. Counting carbohydrates is important to keep your blood glucose at a healthy level, especially if you use insulin or take certain oral diabetes medicines. It is important to know how many carbohydrates you can safely have in each meal. This is different for every person. Your dietitian can help you calculate how many carbohydrates you should have at each meal and for snack. Foods that contain carbohydrates include:  Bread, cereal, rice, pasta, and crackers.  Potatoes and corn.  Peas, beans, and lentils.  Milk and yogurt.  Fruit and juice.  Desserts, such as cakes, cookies, ice cream, and candy.  How does alcohol affect me? Alcohol can cause a sudden decrease in blood  glucose (hypoglycemia), especially if you use insulin or take certain oral diabetes medicines. Hypoglycemia can be a life-threatening condition. Symptoms of hypoglycemia (sleepiness, dizziness, and confusion) are similar to symptoms of having too much alcohol. If your health care provider says that alcohol is safe for you, follow these guidelines:  Limit alcohol intake to no more than 1 drink per day for nonpregnant women and 2 drinks per day for men. One drink equals 12 oz of beer, 5 oz of wine, or 1 oz of hard liquor.  Do not drink on an empty stomach.  Keep yourself hydrated with water, diet soda, or unsweetened iced tea.  Keep in mind that regular soda, juice, and other mixers may contain a lot of sugar and must be counted as carbohydrates.  What are tips for following this plan? Reading food labels  Start by checking the serving size on the label. The amount of calories, carbohydrates, fats, and other nutrients listed on the label are based on one serving of the food. Many foods contain more than one serving per package.  Check the total grams (g) of carbohydrates in one serving. You can calculate the number of servings of carbohydrates in one serving by dividing the total carbohydrates by 15. For example, if a food has 30 g of total carbohydrates, it would be equal to 2 servings of carbohydrates.  Check the number of grams (g) of saturated and trans fats in one serving. Choose foods that have low or no amount of these fats.  Check the number of milligrams (mg) of sodium in one serving. Most people   should limit total sodium intake to less than 2,300 mg per day.  Always check the nutrition information of foods labeled as "low-fat" or "nonfat". These foods may be higher in added sugar or refined carbohydrates and should be avoided.  Talk to your dietitian to identify your daily goals for nutrients listed on the label. Shopping  Avoid buying canned, premade, or processed foods. These  foods tend to be high in fat, sodium, and added sugar.  Shop around the outside edge of the grocery store. This includes fresh fruits and vegetables, bulk grains, fresh meats, and fresh dairy. Cooking  Use low-heat cooking methods, such as baking, instead of high-heat cooking methods like deep frying.  Cook using healthy oils, such as olive, canola, or sunflower oil.  Avoid cooking with butter, cream, or high-fat meats. Meal planning  Eat meals and snacks regularly, preferably at the same times every day. Avoid going long periods of time without eating.  Eat foods high in fiber, such as fresh fruits, vegetables, beans, and whole grains. Talk to your dietitian about how many servings of carbohydrates you can eat at each meal.  Eat 4-6 ounces of lean protein each day, such as lean meat, chicken, fish, eggs, or tofu. 1 ounce is equal to 1 ounce of meat, chicken, or fish, 1 egg, or 1/4 cup of tofu.  Eat some foods each day that contain healthy fats, such as avocado, nuts, seeds, and fish. Lifestyle   Check your blood glucose regularly.  Exercise at least 30 minutes 5 or more days each week, or as told by your health care provider.  Take medicines as told by your health care provider.  Do not use any products that contain nicotine or tobacco, such as cigarettes and e-cigarettes. If you need help quitting, ask your health care provider.  Work with a counselor or diabetes educator to identify strategies to manage stress and any emotional and social challenges. What are some questions to ask my health care provider?  Do I need to meet with a diabetes educator?  Do I need to meet with a dietitian?  What number can I call if I have questions?  When are the best times to check my blood glucose? Where to find more information:  American Diabetes Association: diabetes.org/food-and-fitness/food  Academy of Nutrition and Dietetics:  www.eatright.org/resources/health/diseases-and-conditions/diabetes  National Institute of Diabetes and Digestive and Kidney Diseases (NIH): www.niddk.nih.gov/health-information/diabetes/overview/diet-eating-physical-activity Summary  A healthy meal plan will help you control your blood glucose and maintain a healthy lifestyle.  Working with a diet and nutrition specialist (dietitian) can help you make a meal plan that is best for you.  Keep in mind that carbohydrates and alcohol have immediate effects on your blood glucose levels. It is important to count carbohydrates and to use alcohol carefully. This information is not intended to replace advice given to you by your health care provider. Make sure you discuss any questions you have with your health care provider. Document Released: 04/05/2005 Document Revised: 08/13/2016 Document Reviewed: 08/13/2016 Elsevier Interactive Patient Education  2018 [employer] Inc.  

## 2018-04-03 LAB — CMP14+EGFR
ALBUMIN: 4.3 g/dL (ref 3.5–5.5)
ALK PHOS: 62 IU/L (ref 39–117)
ALT: 41 IU/L (ref 0–44)
AST: 33 IU/L (ref 0–40)
Albumin/Globulin Ratio: 2.4 — ABNORMAL HIGH (ref 1.2–2.2)
BILIRUBIN TOTAL: 0.4 mg/dL (ref 0.0–1.2)
BUN / CREAT RATIO: 16 (ref 9–20)
BUN: 17 mg/dL (ref 6–24)
CO2: 25 mmol/L (ref 20–29)
Calcium: 9.5 mg/dL (ref 8.7–10.2)
Chloride: 99 mmol/L (ref 96–106)
Creatinine, Ser: 1.04 mg/dL (ref 0.76–1.27)
GFR calc Af Amer: 92 mL/min/{1.73_m2} (ref >59)
GFR calc non Af Amer: 80 mL/min/{1.73_m2} (ref >59)
GLOBULIN, TOTAL: 1.8 g/dL (ref 1.5–4.5)
Glucose: 154 mg/dL — ABNORMAL HIGH (ref 65–99)
POTASSIUM: 4 mmol/L (ref 3.5–5.2)
SODIUM: 139 mmol/L (ref 134–144)
Total Protein: 6.1 g/dL (ref 6.0–8.5)

## 2018-04-03 LAB — LIPID PANEL
CHOLESTEROL TOTAL: 124 mg/dL (ref 100–199)
Chol/HDL Ratio: 4.1 ratio (ref 0.0–5.0)
HDL: 30 mg/dL — ABNORMAL LOW (ref >39)
LDL Calculated: 29 mg/dL (ref 0–99)
TRIGLYCERIDES: 327 mg/dL — AB (ref 0–149)
VLDL Cholesterol Cal: 65 mg/dL — ABNORMAL HIGH (ref 5–40)

## 2018-05-22 ENCOUNTER — Telehealth: Payer: Self-pay | Admitting: Nurse Practitioner

## 2018-05-22 NOTE — Telephone Encounter (Signed)
Patient was wanting something for pain and states he is unable to get up or out of the house due to severe pain. Patient daughter aware that we are not allowed to send in pain medication without being seen. Advised to call 911 that they can help get him to the ER for evaluation.

## 2018-05-26 ENCOUNTER — Other Ambulatory Visit: Payer: Self-pay | Admitting: Nurse Practitioner

## 2018-05-26 DIAGNOSIS — E119 Type 2 diabetes mellitus without complications: Secondary | ICD-10-CM

## 2018-07-04 ENCOUNTER — Ambulatory Visit (INDEPENDENT_AMBULATORY_CARE_PROVIDER_SITE_OTHER): Payer: Medicare Other | Admitting: Nurse Practitioner

## 2018-07-04 ENCOUNTER — Encounter: Payer: Self-pay | Admitting: Nurse Practitioner

## 2018-07-04 VITALS — BP 158/93 | HR 71 | Temp 96.2°F | Ht 71.0 in | Wt 324.0 lb

## 2018-07-04 DIAGNOSIS — J440 Chronic obstructive pulmonary disease with acute lower respiratory infection: Secondary | ICD-10-CM

## 2018-07-04 DIAGNOSIS — I10 Essential (primary) hypertension: Secondary | ICD-10-CM | POA: Diagnosis not present

## 2018-07-04 DIAGNOSIS — Z8719 Personal history of other diseases of the digestive system: Secondary | ICD-10-CM

## 2018-07-04 DIAGNOSIS — E119 Type 2 diabetes mellitus without complications: Secondary | ICD-10-CM

## 2018-07-04 DIAGNOSIS — J449 Chronic obstructive pulmonary disease, unspecified: Secondary | ICD-10-CM | POA: Diagnosis not present

## 2018-07-04 DIAGNOSIS — E782 Mixed hyperlipidemia: Secondary | ICD-10-CM

## 2018-07-04 DIAGNOSIS — E1140 Type 2 diabetes mellitus with diabetic neuropathy, unspecified: Secondary | ICD-10-CM

## 2018-07-04 DIAGNOSIS — E1142 Type 2 diabetes mellitus with diabetic polyneuropathy: Secondary | ICD-10-CM | POA: Diagnosis not present

## 2018-07-04 DIAGNOSIS — J209 Acute bronchitis, unspecified: Secondary | ICD-10-CM

## 2018-07-04 LAB — CMP14+EGFR
ALBUMIN: 4.3 g/dL (ref 3.5–5.5)
ALK PHOS: 66 IU/L (ref 39–117)
ALT: 32 IU/L (ref 0–44)
AST: 23 IU/L (ref 0–40)
Albumin/Globulin Ratio: 2.2 (ref 1.2–2.2)
BUN / CREAT RATIO: 15 (ref 9–20)
BUN: 18 mg/dL (ref 6–24)
Bilirubin Total: <0.2 mg/dL (ref 0.0–1.2)
CHLORIDE: 98 mmol/L (ref 96–106)
CO2: 22 mmol/L (ref 20–29)
CREATININE: 1.17 mg/dL (ref 0.76–1.27)
Calcium: 10.2 mg/dL (ref 8.7–10.2)
GFR calc Af Amer: 80 mL/min/{1.73_m2} (ref >59)
GFR calc non Af Amer: 69 mL/min/{1.73_m2} (ref >59)
GLUCOSE: 150 mg/dL — AB (ref 65–99)
Globulin, Total: 2 g/dL (ref 1.5–4.5)
Potassium: 4.2 mmol/L (ref 3.5–5.2)
Sodium: 138 mmol/L (ref 134–144)
Total Protein: 6.3 g/dL (ref 6.0–8.5)

## 2018-07-04 LAB — BAYER DCA HB A1C WAIVED: HB A1C (BAYER DCA - WAIVED): 8 % — ABNORMAL HIGH (ref <7.0)

## 2018-07-04 LAB — LIPID PANEL
CHOLESTEROL TOTAL: 125 mg/dL (ref 100–199)
Chol/HDL Ratio: 4.5 ratio (ref 0.0–5.0)
HDL: 28 mg/dL — ABNORMAL LOW (ref >39)
TRIGLYCERIDES: 406 mg/dL — AB (ref 0–149)

## 2018-07-04 MED ORDER — CANAGLIFLOZIN 100 MG PO TABS: 100.0000 mg | ORAL_TABLET | Freq: Every day | ORAL | 0 refills | Status: DC

## 2018-07-04 MED ORDER — METFORMIN HCL 1000 MG PO TABS: ORAL_TABLET | ORAL | 1 refills | Status: DC

## 2018-07-04 MED ORDER — IPRATROPIUM-ALBUTEROL 0.5-2.5 (3) MG/3ML IN SOLN: 3.0000 mL | Freq: Three times a day (TID) | RESPIRATORY_TRACT | 5 refills | Status: DC | PRN

## 2018-07-04 MED ORDER — GLIPIZIDE 10 MG PO TABS: 10.0000 mg | ORAL_TABLET | Freq: Two times a day (BID) | ORAL | 1 refills | Status: DC

## 2018-07-04 MED ORDER — LISINOPRIL-HYDROCHLOROTHIAZIDE 20-25 MG PO TABS: 1.0000 | ORAL_TABLET | Freq: Every day | ORAL | 1 refills | Status: DC

## 2018-07-04 MED ORDER — BUDESONIDE-FORMOTEROL FUMARATE 160-4.5 MCG/ACT IN AERO: 2.0000 | INHALATION_SPRAY | Freq: Two times a day (BID) | RESPIRATORY_TRACT | 5 refills | Status: DC | PRN

## 2018-07-04 MED ORDER — ATORVASTATIN CALCIUM 40 MG PO TABS: 40.0000 mg | ORAL_TABLET | Freq: Every day | ORAL | 1 refills | Status: DC

## 2018-07-04 MED ORDER — AZITHROMYCIN 250 MG PO TABS: ORAL_TABLET | ORAL | 0 refills | Status: DC

## 2018-07-04 MED ORDER — FENOFIBRATE 160 MG PO TABS: 160.0000 mg | ORAL_TABLET | Freq: Every day | ORAL | 1 refills | Status: DC

## 2018-07-04 MED ORDER — UMECLIDINIUM-VILANTEROL 62.5-25 MCG/INH IN AEPB: 1.0000 | INHALATION_SPRAY | Freq: Every day | RESPIRATORY_TRACT | 3 refills | Status: DC

## 2018-07-04 NOTE — Progress Notes (Signed)
Subjective:    Patient ID: Kurt Blevins, male    DOB: [birth year], 57 y.o.   MRN: [id number]   Chief Complaint: medical management of chronic iues  HPI:  1. Type 2 diabetes mellitus with diabetic polyneuropathy, without long-term current use of insulin (Kurt Blevins) last hgba1c was 8.1%. we increased his glipizide to 10 mg bid. He says his blood sugars in the morning have been around 96-130. No hypoglycemia.  2. Essential hypertension  No c/o chest pain,  or headache. Does not check blood pressure at home. BP Readings from Last 3 Encounters:  07/04/18 (!) 158/93  04/02/18 137/89  02/25/18 118/78     3. Mixed hyperlipidemia  Has been eating a lot of salads ately  4. COPD with asthma ([place])  Has lots of wheezing but not much cough. Has been sob.  5. Neuropathy due to type 2 diabetes mellitus (HCC)  Has constant burning in his feet  6. Severe obesity (BMI >= 40) (HCC)  No recent weight changes  7. History of rectal polyps  He is suppose to have repeated in 1 year- they are suppose to contact him to reind him.    Outpatient Encounter Medications as of 07/04/2018  Medication Sig  . aspirin 81 MG tablet Take 81 mg by mouth daily.  Marland Kitchen atorvastatin (LIPITOR) 40 MG tablet Take 1 tablet (40 mg total) by mouth daily.  . budesonide-formoterol (SYMBICORT) 160-4.5 MCG/ACT inhaler Inhale 2 puffs into the lungs 2 (two) times daily as needed (for shortness of breath).  . fenofibrate 160 MG tablet Take 1 tablet (160 mg total) by mouth daily.  Marland Kitchen glipiZIDE (GLUCOTROL) 10 MG tablet Take 1 tablet (10 mg total) by mouth 2 (two) times daily before a meal.  . ipratropium-albuterol (DUONEB) 0.5-2.5 (3) MG/3ML SOLN Take 3 mLs by nebulization 3 (three) times daily as needed. (Patient taking differently: Take 3 mLs by nebulization 3 (three) times daily as needed (for shortness of breath). )  . lisinopril-hydrochlorothiazide (PRINZIDE,ZESTORETIC) 20-25 MG tablet Take 1 tablet by mouth daily.  . metFORMIN  (GLUCOPHAGE) 1000 MG tablet TAKE 1 TABLET TWICE DAILY WITH A MEAL.  Marland Kitchen ONETOUCH DELICA LANCETS 69C MISC Check blood sugar 1x per day and prn  E 11.9  . ONETOUCH VERIO test strip CHECK BLOOD SUGAR ONCE DAILY AS DIRECTED.  Marland Kitchen umeclidinium-vilanterol (ANORO ELLIPTA) 62.5-25 MCG/INH AEPB Inhale 1 puff into the lungs daily.       New complaints: Slight cough and congestion. No fever  Social history: Lives with his wife. He is on disability and his wife works at [employer].  Review of Systems  Constitutional: Negative for activity change and appetite change.  HENT: Negative.   Eyes: Negative for pain.  Respiratory: Negative for shortness of breath.   Cardiovascular: Negative for chest pain, palpitations and leg swelling.  Gastrointestinal: Negative for abdominal pain.  Endocrine: Negative for polydipsia.  Genitourinary: Negative.   Skin: Negative for rash.  Neurological: Negative for dizziness, weakness and headaches.  Hematological: Does not bruise/bleed easily.  Psychiatric/Behavioral: Negative.   All other systems reviewed and are negative.      Objective:   Physical Exam Vitals signs and nursing note reviewed.  Constitutional:      Appearance: Normal appearance. He is well-developed.  HENT:     Head: Normocephalic.     Right Ear: Tympanic membrane, ear canal and external ear normal.     Left Ear: Tympanic membrane and ear canal normal.     Nose: Nose normal.  Mouth/Throat:     Mouth: Mucous membranes are moist.  Eyes:     Pupils: Pupils are equal, round, and reactive to light.  Neck:     Musculoskeletal: Normal range of motion and neck supple.     Thyroid: No thyroid mass or thyromegaly.     Vascular: No carotid bruit or JVD.     Trachea: Phonation normal.  Cardiovascular:     Rate and Rhythm: Normal rate and regular rhythm.  Pulmonary:     Effort: Pulmonary effort is normal. No respiratory distress.     Breath sounds: Wheezing (exp throughout ) present.    Abdominal:     General: Bowel sounds are normal.     Palpations: Abdomen is soft.     Tenderness: There is no abdominal tenderness.  Musculoskeletal: Normal range of motion.     Right lower leg: Edema (2+) present.     Left lower leg: Left lower leg edema: 2+     Comments: Walking with a cane  Lymphadenopathy:     Cervical: No cervical adenopathy.  Skin:    General: Skin is warm and dry.  Neurological:     Mental Status: He is alert and oriented to person, place, and time.  Psychiatric:        Behavior: Behavior normal.        Thought Content: Thought content normal.        Judgment: Judgment normal.    BP (!) 158/93   Pulse 71   Temp (!) 96.2 F (35.7 C) (Oral)   Ht _0  (1.803 m)   Wt (!) 324 lb (147 kg)   SpO2 97%   BMI 45.19 kg/m   hgba1c 8.0       Assessment & Plan:  [name] comes in today with chief complaint of Medical Management of Chronic Issues (congestion)   Diagnosis and orders addressed:  1. Type 2 diabetes mellitus with diabetic polyneuropathy, without long-term current use of insulin (HCC) Added invkana 12m 1 daily Keep diary of blood sugars - metFORMIN (GLUCOPHAGE) 1000 MG tablet; TAKE 1 TABLET TWICE DAILY WITH A MEAL.  Dispense: 180 tablet; Refill: 1 - Bayer DCA Hb A1c Waived - Microalbumin / creatinine urine ratio - glipiZIDE (GLUCOTROL) 10 MG tablet; Take 1 tablet (10 mg total) by mouth 2 (two) times daily before a meal.  Dispense: 180 tablet; Refill: 1  2. Essential hypertension Low sodium diet - CMP14+EGFR - lisinopril-hydrochlorothiazide (PRINZIDE,ZESTORETIC) 20-25 MG tablet; Take 1 tablet by mouth daily.  Dispense: 90 tablet; Refill: 1  3. Mixed hyperlipidemia Low fat diet - Lipid panel - atorvastatin (LIPITOR) 40 MG tablet; Take 1 tablet (40 mg total) by mouth daily.  Dispense: 90 tablet; Refill: 1 - fenofibrate 160 MG tablet; Take 1 tablet (160 mg total) by mouth daily.  Dispense: 90 tablet; Refill: 1  4. COPD with  asthma ([place] continue all inhalers - umeclidinium-vilanterol (ANORO ELLIPTA) 62.5-25 MCG/INH AEPB; Inhale 1 puff into the lungs daily.  Dispense: 4 each; Refill: 3 - ipratropium-albuterol (DUONEB) 0.5-2.5 (3) MG/3ML SOLN; Take 3 mLs by nebulization 3 (three) times daily as needed (for shortness of breath).  Dispense: 3 mL; Refill: 5   5. Neuropathy due to type 2 diabetes mellitus ([place] Do not go barefooted Check feet daily  6. Severe obesity (BMI >= 40) (HCC) Discussed diet and exercise for person with BMI >25 Will recheck weight in 3-6 months  7. History of rectal polyps Keep follow up with GI  8.  Acute bronchitis with COPD ([place]) 1. Take meds as prescribed 2. Use a cool mist humidifier especially during the winter months and when heat has been humid. 3. Use saline nose sprays frequently 4. Saline irrigations of the nose can be very helpful if done frequently.  * 4X daily for 1 week*  * Use of a nettie pot can be helpful with this. Follow directions with this* 5. Drink plenty of fluids 6. Keep thermostat turn down low 7.For any cough or congestion  Use plain Mucinex- regular strength or max strength is fine   * Children- consult with Pharmacist for dosing 8. For fever or aces or pains- take tylenol or ibuprofen appropriate for age and weight.  * for fevers greater than 101 orally you may alternate ibuprofen and tylenol every  3 hours.    - azithromycin (ZITHROMAX Z-PAK) 250 MG tablet; As directed  Dispense: 6 tablet; Refill: 0   Labs pending Health Maintenance reviewed Diet and exercise encouraged  Follow up plan: 1 month   [place], FNP

## 2018-07-04 NOTE — Patient Instructions (Signed)

## 2018-08-08 ENCOUNTER — Encounter: Payer: Self-pay | Admitting: Nurse Practitioner

## 2018-08-08 ENCOUNTER — Ambulatory Visit (INDEPENDENT_AMBULATORY_CARE_PROVIDER_SITE_OTHER): Payer: Medicare Other | Admitting: Nurse Practitioner

## 2018-08-08 VITALS — BP 135/87 | HR 72 | Temp 96.7°F | Ht 71.0 in | Wt 327.0 lb

## 2018-08-08 DIAGNOSIS — E1142 Type 2 diabetes mellitus with diabetic polyneuropathy: Secondary | ICD-10-CM | POA: Diagnosis not present

## 2018-08-08 DIAGNOSIS — J209 Acute bronchitis, unspecified: Secondary | ICD-10-CM

## 2018-08-08 DIAGNOSIS — J42 Unspecified chronic bronchitis: Secondary | ICD-10-CM | POA: Diagnosis not present

## 2018-08-08 LAB — BAYER DCA HB A1C WAIVED: HB A1C: 7.6 % — AB (ref <7.0)

## 2018-08-08 MED ORDER — HYDROCODONE-HOMATROPINE 5-1.5 MG/5ML PO SYRP
5.0000 mL | ORAL_SOLUTION | Freq: Four times a day (QID) | ORAL | 0 refills | Status: DC | PRN
Start: 2018-08-08 — End: 2018-09-25

## 2018-08-08 MED ORDER — DOXYCYCLINE HYCLATE 100 MG PO TABS: 100.0000 mg | ORAL_TABLET | Freq: Two times a day (BID) | ORAL | 0 refills | Status: DC

## 2018-08-08 NOTE — Patient Instructions (Signed)

## 2018-08-08 NOTE — Progress Notes (Signed)
Subjective:    Patient ID: Kurt Blevins, male    DOB: [birth year], 58 y.o.   MRN: [id number]   Chief Complaint: Recheck Hgb A1C (Couldn't take Invokana. Upset his stomach); Sinus Problem; and Ear Pain   HPI Patient comes in today: - recheck of diabetes. Last hgba1c was 8.0%. we added invokana to meds and he said  It made him sick, so he stopped taking it. He says he has changed his det and blood sugars are improving. - he is c/o nasal congestion and ear pain- started about a month ago and has gotten worse in the last few days. He wa son antibiotic right after christmas which did not help.  Review of Systems  Constitutional: Negative for chills and fever.  HENT: Positive for congestion, sinus pressure, sinus pain and trouble swallowing.   Respiratory: Positive for cough, shortness of breath (but no worse then normal) and wheezing.   Neurological: Negative.   Psychiatric/Behavioral: Negative.   All other systems reviewed and are negative.      Objective:   Physical Exam Vitals signs and nursing note reviewed.  Constitutional:      Appearance: He is obese.  HENT:     Right Ear: Hearing, tympanic membrane, ear canal and external ear normal.     Left Ear: Hearing, tympanic membrane, ear canal and external ear normal.     Nose:     Right Turbinates: Pale.     Left Turbinates: Pale.     Right Sinus: No maxillary sinus tenderness or frontal sinus tenderness.     Left Sinus: No maxillary sinus tenderness or frontal sinus tenderness.     Mouth/Throat:     Lips: Pink.     Pharynx: Oropharynx is clear.  Neck:     Musculoskeletal: Normal range of motion.  Cardiovascular:     Rate and Rhythm: Normal rate and regular rhythm.     Heart sounds: Normal heart sounds.  Pulmonary:     Effort: Pulmonary effort is normal.     Breath sounds: Wheezing (exp wheezes throughout) present.  Abdominal:     Palpations: Abdomen is soft.  Skin:    General: Skin is warm and dry.  Neurological:    General: No focal deficit present.     Mental Status: He is alert and oriented to person, place, and time.  Psychiatric:        Mood and Affect: Mood normal.        Behavior: Behavior normal.    BP 135/87   Pulse 72   Temp (!) 96.7 F (35.9 C) (Oral)   Ht 5\' 11"  (1.803 m)   Wt (!) 327 lb (148.3 kg)   SpO2 96%   BMI 45.61 kg/m    hgba1c 7.6     Assessment & Plan:  [name] in today with chief complaint of Recheck Hgb A1C (Couldn't take Invokana. Upset his stomach); Sinus Problem; and Ear Pain   1. Type 2 diabetes mellitus with diabetic polyneuropathy, without long-term current use of insulin (HCC) Continue to watch carbs in diet - Bayer DCA Hb A1c Waived  2. Acute exacerbation of chronic bronchitis (HCC) Run humidifier Force fluids Nebs as needed - doxycycline (VIBRA-TABS) 100 MG tablet; Take 1 tablet (100 mg total) by mouth 2 (two) times daily. 1 po bid  Dispense: 20 tablet; Refill: 0 - HYDROcodone-homatropine (HYCODAN) 5-1.5 MG/5ML syrup; Take 5 mLs by mouth every 6 (six) hours as needed for cough.  Dispense: 120 mL; Refill:  [place], FNP

## 2018-09-25 ENCOUNTER — Encounter: Payer: Self-pay | Admitting: *Deleted

## 2018-09-25 ENCOUNTER — Encounter: Payer: Self-pay | Admitting: Nurse Practitioner

## 2018-09-25 ENCOUNTER — Ambulatory Visit (INDEPENDENT_AMBULATORY_CARE_PROVIDER_SITE_OTHER): Payer: Medicare Other | Admitting: *Deleted

## 2018-09-25 ENCOUNTER — Ambulatory Visit (INDEPENDENT_AMBULATORY_CARE_PROVIDER_SITE_OTHER): Payer: Medicare Other | Admitting: Nurse Practitioner

## 2018-09-25 VITALS — BP 140/92 | HR 72 | Temp 97.2°F | Ht 71.0 in | Wt 326.0 lb

## 2018-09-25 VITALS — BP 140/92 | HR 72 | Ht 71.0 in | Wt 326.0 lb

## 2018-09-25 DIAGNOSIS — I10 Essential (primary) hypertension: Secondary | ICD-10-CM

## 2018-09-25 DIAGNOSIS — Z0000 Encounter for general adult medical examination without abnormal findings: Secondary | ICD-10-CM | POA: Diagnosis not present

## 2018-09-25 DIAGNOSIS — E782 Mixed hyperlipidemia: Secondary | ICD-10-CM

## 2018-09-25 DIAGNOSIS — E1140 Type 2 diabetes mellitus with diabetic neuropathy, unspecified: Secondary | ICD-10-CM | POA: Diagnosis not present

## 2018-09-25 DIAGNOSIS — J449 Chronic obstructive pulmonary disease, unspecified: Secondary | ICD-10-CM

## 2018-09-25 DIAGNOSIS — E1142 Type 2 diabetes mellitus with diabetic polyneuropathy: Secondary | ICD-10-CM

## 2018-09-25 DIAGNOSIS — Z0001 Encounter for general adult medical examination with abnormal findings: Secondary | ICD-10-CM

## 2018-09-25 LAB — BAYER DCA HB A1C WAIVED: HB A1C (BAYER DCA - WAIVED): 7.4 % — ABNORMAL HIGH (ref <7.0)

## 2018-09-25 MED ORDER — LISINOPRIL-HYDROCHLOROTHIAZIDE 20-25 MG PO TABS: 1.0000 | ORAL_TABLET | Freq: Every day | ORAL | 1 refills | Status: DC

## 2018-09-25 MED ORDER — METFORMIN HCL 1000 MG PO TABS: ORAL_TABLET | ORAL | 1 refills | Status: DC

## 2018-09-25 MED ORDER — GLIPIZIDE 10 MG PO TABS: 10.0000 mg | ORAL_TABLET | Freq: Two times a day (BID) | ORAL | 1 refills | Status: DC

## 2018-09-25 MED ORDER — UMECLIDINIUM-VILANTEROL 62.5-25 MCG/INH IN AEPB: 1.0000 | INHALATION_SPRAY | Freq: Every day | RESPIRATORY_TRACT | 3 refills | Status: DC

## 2018-09-25 MED ORDER — FENOFIBRATE 160 MG PO TABS: 160.0000 mg | ORAL_TABLET | Freq: Every day | ORAL | 1 refills | Status: DC

## 2018-09-25 MED ORDER — ATORVASTATIN CALCIUM 40 MG PO TABS: 40.0000 mg | ORAL_TABLET | Freq: Every day | ORAL | 1 refills | Status: DC

## 2018-09-25 NOTE — Patient Instructions (Signed)
Diabetes Mellitus and Foot Care  Foot care is an important part of your health, especially when you have diabetes. Diabetes may cause you to have problems because of poor blood flow (circulation) to your feet and legs, which can cause your skin to:   Become thinner and drier.   Break more easily.   Heal more slowly.   Peel and crack.  You may also have nerve damage (neuropathy) in your legs and feet, causing decreased feeling in them. This means that you may not notice minor injuries to your feet that could lead to more serious problems. Noticing and addressing any potential problems early is the best way to prevent future foot problems.  How to care for your feet  Foot hygiene   Wash your feet daily with warm water and mild soap. Do not use hot water. Then, pat your feet and the areas between your toes until they are completely dry. Do not soak your feet as this can dry your skin.   Trim your toenails straight across. Do not dig under them or around the cuticle. File the edges of your nails with an emery board or nail file.   Apply a moisturizing lotion or petroleum jelly to the skin on your feet and to dry, brittle toenails. Use lotion that does not contain alcohol and is unscented. Do not apply lotion between your toes.  Shoes and socks   Wear clean socks or stockings every day. Make sure they are not too tight. Do not wear knee-high stockings since they may decrease blood flow to your legs.   Wear shoes that fit properly and have enough cushioning. Always look in your shoes before you put them on to be sure there are no objects inside.   To break in new shoes, wear them for just a few hours a day. This prevents injuries on your feet.  Wounds, scrapes, corns, and calluses   Check your feet daily for blisters, cuts, bruises, sores, and redness. If you cannot see the bottom of your feet, use a mirror or ask someone for help.   Do not cut corns or calluses or try to remove them with medicine.   If you  find a minor scrape, cut, or break in the skin on your feet, keep it and the skin around it clean and dry. You may clean these areas with mild soap and water. Do not clean the area with peroxide, alcohol, or iodine.   If you have a wound, scrape, corn, or callus on your foot, look at it several times a day to make sure it is healing and not infected. Check for:  ? Redness, swelling, or pain.  ? Fluid or blood.  ? Warmth.  ? Pus or a bad smell.  General instructions   Do not cross your legs. This may decrease blood flow to your feet.   Do not use heating pads or hot water bottles on your feet. They may burn your skin. If you have lost feeling in your feet or legs, you may not know this is happening until it is too late.   Protect your feet from hot and cold by wearing shoes, such as at the beach or on hot pavement.   Schedule a complete foot exam at least once a year (annually) or more often if you have foot problems. If you have foot problems, report any cuts, sores, or bruises to your health care provider immediately.  Contact a health care provider if:     You have a medical condition that increases your risk of infection and you have any cuts, sores, or bruises on your feet.   You have an injury that is not healing.   You have redness on your legs or feet.   You feel burning or tingling in your legs or feet.   You have pain or cramps in your legs and feet.   Your legs or feet are numb.   Your feet always feel cold.   You have pain around a toenail.  Get help right away if:   You have a wound, scrape, corn, or callus on your foot and:  ? You have pain, swelling, or redness that gets worse.  ? You have fluid or blood coming from the wound, scrape, corn, or callus.  ? Your wound, scrape, corn, or callus feels warm to the touch.  ? You have pus or a bad smell coming from the wound, scrape, corn, or callus.  ? You have a fever.  ? You have a red line going up your leg.  Summary   Check your feet every day  for cuts, sores, red spots, swelling, and blisters.   Moisturize feet and legs daily.   Wear shoes that fit properly and have enough cushioning.   If you have foot problems, report any cuts, sores, or bruises to your health care provider immediately.   Schedule a complete foot exam at least once a year (annually) or more often if you have foot problems.  This information is not intended to replace advice given to you by your health care provider. Make sure you discuss any questions you have with your health care provider.  Document Released: 07/06/2000 Document Revised: 08/21/2017 Document Reviewed: 08/10/2016  Elsevier Interactive Patient Education  2019 [employer]

## 2018-09-25 NOTE — Progress Notes (Addendum)
Subjective:    Patient ID: Kurt Blevins, male    DOB: [birth year], 58 y.o.   MRN: [id number]   Chief Complaint: Medical Management of Chronic Issues   HPI:  1. Type 2 diabetes mellitus with diabetic polyneuropathy, without long-term current use of insulin ([place]) last hgba1c was 7.6%. blood sugars have been running below 150 fasting. He has been trying to watch carbs in diet.  2. Essential hypertension  No c/o chest pain, sob or headache.  Does not check blood pressure at home. BP Readings from Last 3 Encounters:  09/25/18 (!) 140/92  09/25/18 (!) 140/92  08/08/18 135/87     3. Mixed hyperlipidemia  He is wife has been trying to help him watch his diet.  4. Neuropathy due to type 2 diabetes mellitus (HCC)  Big toes getting tingly at times  5. Severe obesity (BMI >= 40) (HCC)  Weight is unchanged  6. COPD with asthma ([place])  Has been dong well. He does not see pulmonologist.    Outpatient Encounter Medications as of 09/25/2018  Medication Sig  . aspirin 81 MG tablet Take 81 mg by mouth daily.  Marland Kitchen atorvastatin (LIPITOR) 40 MG tablet Take 1 tablet (40 mg total) by mouth daily.  . budesonide-formoterol (SYMBICORT) 160-4.5 MCG/ACT inhaler Inhale 2 puffs into the lungs 2 (two) times daily as needed (for shortness of breath).  . canagliflozin (INVOKANA) 100 MG TABS tablet Take 1 tablet (100 mg total) by mouth daily before breakfast.  . fenofibrate 160 MG tablet Take 1 tablet (160 mg total) by mouth daily.  Marland Kitchen glipiZIDE (GLUCOTROL) 10 MG tablet Take 1 tablet (10 mg total) by mouth 2 (two) times daily before a meal.  . ipratropium-albuterol (DUONEB) 0.5-2.5 (3) MG/3ML SOLN Take 3 mLs by nebulization 3 (three) times daily as needed (for shortness of breath).  Marland Kitchen lisinopril-hydrochlorothiazide (PRINZIDE,ZESTORETIC) 20-25 MG tablet Take 1 tablet by mouth daily.  . metFORMIN (GLUCOPHAGE) 1000 MG tablet TAKE 1 TABLET TWICE DAILY WITH A MEAL.  Marland Kitchen ONETOUCH DELICA LANCETS 09N MISC Check blood sugar  1x per day and prn  E 11.9  . ONETOUCH VERIO test strip CHECK BLOOD SUGAR ONCE DAILY AS DIRECTED.  Marland Kitchen umeclidinium-vilanterol (ANORO ELLIPTA) 62.5-25 MCG/INH AEPB Inhale 1 puff into the lungs daily.     New complaints: None today  Social history: Is on disability for COPD   Review of Systems  Constitutional: Negative for activity change and appetite change.  HENT: Negative.   Eyes: Negative for pain.  Respiratory: Positive for shortness of breath (no worse then usual).   Cardiovascular: Positive for leg swelling. Negative for chest pain and palpitations.  Gastrointestinal: Negative for abdominal pain.  Endocrine: Negative for polydipsia.  Genitourinary: Negative.   Musculoskeletal: Negative.   Skin: Negative for rash.  Neurological: Negative for dizziness, weakness and headaches.  Hematological: Does not bruise/bleed easily.  Psychiatric/Behavioral: Negative.   All other systems reviewed and are negative.      Objective:   Physical Exam Vitals signs and nursing note reviewed.  Constitutional:      Appearance: Normal appearance. He is well-developed. He is obese.  HENT:     Head: Normocephalic.     Nose: Nose normal.  Eyes:     Pupils: Pupils are equal, round, and reactive to light.  Neck:     Musculoskeletal: Normal range of motion and neck supple.     Thyroid: No thyroid mass or thyromegaly.     Vascular: No carotid bruit or JVD.  Trachea: Phonation normal.  Cardiovascular:     Rate and Rhythm: Normal rate and regular rhythm.  Pulmonary:     Effort: Pulmonary effort is normal. No respiratory distress.     Breath sounds: Normal breath sounds.  Abdominal:     General: Bowel sounds are normal.     Palpations: Abdomen is soft.     Tenderness: There is no abdominal tenderness.  Musculoskeletal: Normal range of motion.  Lymphadenopathy:     Cervical: No cervical adenopathy.  Skin:    General: Skin is warm and dry.  Neurological:     Mental Status: He is alert  and oriented to person, place, and time.  Psychiatric:        Behavior: Behavior normal.        Thought Content: Thought content normal.        Judgment: Judgment normal.    BP (!) 140/92   Pulse 72   Temp (!) 97.2 F (36.2 C) (Oral)   Ht _0  (1.803 m)   Wt (!) 326 lb (147.9 kg)   SpO2 95%   BMI 45.47 kg/m   hgba1c 7.4      Assessment & Plan:  Kurt Blevins comes in today with chief complaint of Medical Management of Chronic Issues   Diagnosis and orders addressed:  1. Type 2 diabetes mellitus with diabetic polyneuropathy, without long-term current use of insulin (HCC) Continue to watch carbs in diet - Bayer DCA Hb A1c Waived - Microalbumin / creatinine urine ratio - glipiZIDE (GLUCOTROL) 10 MG tablet; Take 1 tablet (10 mg total) by mouth 2 (two) times daily before a meal.  Dispense: 180 tablet; Refill: 1 - metFORMIN (GLUCOPHAGE) 1000 MG tablet; TAKE 1 TABLET TWICE DAILY WITH A MEAL.  Dispense: 180 tablet; Refill: 1   2. Essential hypertension Low sodium diet - CMP14+EGFR - lisinopril-hydrochlorothiazide (PRINZIDE,ZESTORETIC) 20-25 MG tablet; Take 1 tablet by mouth daily.  Dispense: 90 tablet; Refill: 1  3. Mixed hyperlipidemia Low fat diet - Lipid panel - atorvastatin (LIPITOR) 40 MG tablet; Take 1 tablet (40 mg total) by mouth daily.  Dispense: 90 tablet; Refill: 1 - fenofibrate 160 MG tablet; Take 1 tablet (160 mg total) by mouth daily.  Dispense: 90 tablet; Refill: 1  4. Neuropathy due to type 2 diabetes mellitus ([place]) Do not go barefooted  5. Severe obesity (BMI >= 40) (HCC) Discussed diet and exercise for person with BMI >25 Will recheck weight in 3-6 months  6. COPD with asthma ([place]) Continue all inhalers - umeclidinium-vilanterol (ANORO ELLIPTA) 62.5-25 MCG/INH AEPB; Inhale 1 puff into the lungs daily.  Dispense: 4 each; Refill: 3  Patient will schedule eye exam Labs pending Health Maintenance reviewed Diet and exercise encouraged  Follow up  plan: 3 months   Mary-Margaret Hassell Done, FNP

## 2018-09-25 NOTE — Patient Instructions (Addendum)
You set a goal of losing weight- to less than 300 lbs.  Your plan for meeting this goal is to continue to make healthier food choices, and increase walking to at least 45 min per day.   Please review the information given on Advance Directives.  If you complete the paperwork, please bring a copy to our office to be filed in your medical record.   Please remember to go for your eye exam as soon as possible.  Please follow up with Chevis Pretty, FNP as scheduled.   Thank you for coming in for your Annual Wellness Visit!!   Preventive Care 40-64 Years, Male Preventive care refers to lifestyle choices and visits with your health care provider that can promote health and wellness. What does preventive care include?   A yearly physical exam. This is also called an annual well check.  Dental exams once or twice a year.  Routine eye exams. Ask your health care provider how often you should have your eyes checked.  Personal lifestyle choices, including: ? Daily care of your teeth and gums. ? Regular physical activity. ? Eating a healthy diet. ? Avoiding tobacco and drug use. ? Limiting alcohol use. ? Practicing safe sex. ? Taking low-dose aspirin every day starting at age 39. What happens during an annual well check? The services and screenings done by your health care provider during your annual well check will depend on your age, overall health, lifestyle risk factors, and family history of disease. Counseling Your health care provider may ask you questions about your:  Alcohol use.  Tobacco use.  Drug use.  Emotional well-being.  Home and relationship well-being.  Sexual activity.  Eating habits.  Work and work Statistician. Screening You may have the following tests or measurements:  Height, weight, and BMI.  Blood pressure.  Lipid and cholesterol levels. These may be checked every 5 years, or more frequently if you are over 20 years old.  Skin  check.  Lung cancer screening. You may have this screening every year starting at age 70 if you have a 30-pack-year history of smoking and currently smoke or have quit within the past 15 years.  Colorectal cancer screening. All adults should have this screening starting at age 17 and continuing until age 88. Your health care provider may recommend screening at age 16. You will have tests every 1-10 years, depending on your results and the type of screening test. People at increased risk should start screening at an earlier age. Screening tests may include: ? Guaiac-based fecal occult blood testing. ? Fecal immunochemical test (FIT). ? Stool DNA test. ? Virtual colonoscopy. ? Sigmoidoscopy. During this test, a flexible tube with a tiny camera (sigmoidoscope) is used to examine your rectum and lower colon. The sigmoidoscope is inserted through your anus into your rectum and lower colon. ? Colonoscopy. During this test, a long, thin, flexible tube with a tiny camera (colonoscope) is used to examine your entire colon and rectum.  Prostate cancer screening. Recommendations will vary depending on your family history and other risks.  Hepatitis C blood test.  Hepatitis B blood test.  Sexually transmitted disease (STD) testing.  Diabetes screening. This is done by checking your blood sugar (glucose) after you have not eaten for a while (fasting). You may have this done every 1-3 years. Discuss your test results, treatment options, and if necessary, the need for more tests with your health care provider. Vaccines Your health care provider may recommend certain vaccines, such  as:  Influenza vaccine. This is recommended every year.  Tetanus, diphtheria, and acellular pertussis (Tdap, Td) vaccine. You may need a Td booster every 10 years.  Varicella vaccine. You may need this if you have not been vaccinated.  Zoster vaccine. You may need this after age 64.  Measles, mumps, and rubella (MMR)  vaccine. You may need at least one dose of MMR if you were born in 1957 or later. You may also need a second dose.  Pneumococcal 13-valent conjugate (PCV13) vaccine. You may need this if you have certain conditions and have not been vaccinated.  Pneumococcal polysaccharide (PPSV23) vaccine. You may need one or two doses if you smoke cigarettes or if you have certain conditions.  Meningococcal vaccine. You may need this if you have certain conditions.  Hepatitis A vaccine. You may need this if you have certain conditions or if you travel or work in places where you may be exposed to hepatitis A.  Hepatitis B vaccine. You may need this if you have certain conditions or if you travel or work in places where you may be exposed to hepatitis B.  Haemophilus influenzae type b (Hib) vaccine. You may need this if you have certain risk factors. Talk to your health care provider about which screenings and vaccines you need and how often you need them. This information is not intended to replace advice given to you by your health care provider. Make sure you discuss any questions you have with your health care provider. Document Released: 08/05/2015 Document Revised: 08/29/2017 Document Reviewed: 05/10/2015 Elsevier Interactive Patient Education  2019 [place].   Diabetes Mellitus and Nutrition, Adult When you have diabetes (diabetes mellitus), it is very important to have healthy eating habits because your blood sugar (glucose) levels are greatly affected by what you eat and drink. Eating healthy foods in the appropriate amounts, at about the same times every day, can help you:  Control your blood glucose.  Lower your risk of heart disease.  Improve your blood pressure.  Reach or maintain a healthy weight. Every person with diabetes is different, and each person has different needs for a meal plan. Your health care provider may recommend that you work with a diet and nutrition specialist  (dietitian) to make a meal plan that is best for you. Your meal plan may vary depending on factors such as:  The calories you need.  The medicines you take.  Your weight.  Your blood glucose, blood pressure, and cholesterol levels.  Your activity level.  Other health conditions you have, such as heart or kidney disease. How do carbohydrates affect me? Carbohydrates, also called carbs, affect your blood glucose level more than any other type of food. Eating carbs naturally raises the amount of glucose in your blood. Carb counting is a method for keeping track of how many carbs you eat. Counting carbs is important to keep your blood glucose at a healthy level, especially if you use insulin or take certain oral diabetes medicines. It is important to know how many carbs you can safely have in each meal. This is different for every person. Your dietitian can help you calculate how many carbs you should have at each meal and for each snack. Foods that contain carbs include:  Bread, cereal, rice, pasta, and crackers.  Potatoes and corn.  Peas, beans, and lentils.  Milk and yogurt.  Fruit and juice.  Desserts, such as cakes, cookies, ice cream, and candy. How does alcohol affect me? Alcohol  can cause a sudden decrease in blood glucose (hypoglycemia), especially if you use insulin or take certain oral diabetes medicines. Hypoglycemia can be a life-threatening condition. Symptoms of hypoglycemia (sleepiness, dizziness, and confusion) are similar to symptoms of having too much alcohol. If your health care provider says that alcohol is safe for you, follow these guidelines:  Limit alcohol intake to no more than 1 drink per day for nonpregnant women and 2 drinks per day for men. One drink equals 12 oz of beer, 5 oz of wine, or 1 oz of hard liquor.  Do not drink on an empty stomach.  Keep yourself hydrated with water, diet soda, or unsweetened iced tea.  Keep in mind that regular soda,  juice, and other mixers may contain a lot of sugar and must be counted as carbs. What are tips for following this plan?  Reading food labels  Start by checking the serving size on the "Nutrition Facts" label of packaged foods and drinks. The amount of calories, carbs, fats, and other nutrients listed on the label is based on one serving of the item. Many items contain more than one serving per package.  Check the total grams (g) of carbs in one serving. You can calculate the number of servings of carbs in one serving by dividing the total carbs by 15. For example, if a food has 30 g of total carbs, it would be equal to 2 servings of carbs.  Check the number of grams (g) of saturated and trans fats in one serving. Choose foods that have low or no amount of these fats.  Check the number of milligrams (mg) of salt (sodium) in one serving. Most people should limit total sodium intake to less than 2,300 mg per day.  Always check the nutrition information of foods labeled as "low-fat" or "nonfat". These foods may be higher in added sugar or refined carbs and should be avoided.  Talk to your dietitian to identify your daily goals for nutrients listed on the label. Shopping  Avoid buying canned, premade, or processed foods. These foods tend to be high in fat, sodium, and added sugar.  Shop around the outside edge of the grocery store. This includes fresh fruits and vegetables, bulk grains, fresh meats, and fresh dairy. Cooking  Use low-heat cooking methods, such as baking, instead of high-heat cooking methods like deep frying.  Cook using healthy oils, such as olive, canola, or sunflower oil.  Avoid cooking with butter, cream, or high-fat meats. Meal planning  Eat meals and snacks regularly, preferably at the same times every day. Avoid going long periods of time without eating.  Eat foods high in fiber, such as fresh fruits, vegetables, beans, and whole grains. Talk to your dietitian about  how many servings of carbs you can eat at each meal.  Eat 4-6 ounces (oz) of lean protein each day, such as lean meat, chicken, fish, eggs, or tofu. One oz of lean protein is equal to: ? 1 oz of meat, chicken, or fish. ? 1 egg. ?  cup of tofu.  Eat some foods each day that contain healthy fats, such as avocado, nuts, seeds, and fish. Lifestyle  Check your blood glucose regularly.  Exercise regularly as told by your health care provider. This may include: ? 150 minutes of moderate-intensity or vigorous-intensity exercise each week. This could be brisk walking, biking, or water aerobics. ? Stretching and doing strength exercises, such as yoga or weightlifting, at least 2 times a week.  Take  medicines as told by your health care provider.  Do not use any products that contain nicotine or tobacco, such as cigarettes and e-cigarettes. If you need help quitting, ask your health care provider.  Work with a Social worker or diabetes educator to identify strategies to manage stress and any emotional and social challenges. Questions to ask a health care provider  Do I need to meet with a diabetes educator?  Do I need to meet with a dietitian?  What number can I call if I have questions?  When are the best times to check my blood glucose? Where to find more information:  American Diabetes Association: diabetes.org  Academy of Nutrition and Dietetics: www.eatright.[employer] of Diabetes and Digestive and Kidney Diseases (NIH): [employer] Summary  A healthy meal plan will help you control your blood glucose and maintain a healthy lifestyle.  Working with a diet and nutrition specialist (dietitian) can help you make a meal plan that is best for you.  Keep in mind that carbohydrates (carbs) and alcohol have immediate effects on your blood glucose levels. It is important to count carbs and to use alcohol carefully. This information is not intended to replace advice given  to you by your health care provider. Make sure you discuss any questions you have with your health care provider. Document Released: 04/05/2005 Document Revised: 02/06/2017 Document Reviewed: 08/13/2016 Elsevier Interactive Patient Education  2019 [employer].

## 2018-09-25 NOTE — Progress Notes (Addendum)
Subjective:   Kurt Blevins is a 58 y.o. male who presents for a subsequent Medicare Annual Wellness Visit.  Kurt Blevins is accompanied today by his wife.  He worked in [employer] until he went out of work on disability due to back problems.  He has 4 biological children, 2 step children, and 4 grandchildren.  He lives at home with his wife.  Kurt Blevins enjoys visiting with friends that usually stop by his home or call him daily, he also enjoys spending time with his grandchildren.   Patient Care Team: Chevis Pretty, FNP as PCP - General (Nurse Practitioner) Doran Stabler, MD as Consulting Physician (Gastroenterology)  Hospitalizations, surgeries, and ER visits in previous 12 months No hospitalizations, ER visits, or surgeries this past year.   Review of Systems    Patient reports that his overall health is worse compared to last year because he has increased shortness of breath.  Cardiac Risk Factors include: advanced age (>54men, >56 women);diabetes mellitus;dyslipidemia;family history of premature cardiovascular disease;hypertension;male gender;obesity (BMI >30kg/m2)  Musculoskeletal- back pain  All other systems negative       Current Medications (verified) Outpatient Encounter Medications as of 09/25/2018  Medication Sig  . aspirin 81 MG tablet Take 81 mg by mouth daily.  . budesonide-formoterol (SYMBICORT) 160-4.5 MCG/ACT inhaler Inhale 2 puffs into the lungs 2 (two) times daily as needed (for shortness of breath).  . canagliflozin (INVOKANA) 100 MG TABS tablet Take 1 tablet (100 mg total) by mouth daily before breakfast.  . ipratropium-albuterol (DUONEB) 0.5-2.5 (3) MG/3ML SOLN Take 3 mLs by nebulization 3 (three) times daily as needed (for shortness of breath).  Glory Rosebush DELICA LANCETS 14E MISC Check blood sugar 1x per day and prn  E 11.9  . ONETOUCH VERIO test strip CHECK BLOOD SUGAR ONCE DAILY AS DIRECTED.  . [DISCONTINUED] atorvastatin (LIPITOR) 40 MG tablet  Take 1 tablet (40 mg total) by mouth daily.  . [DISCONTINUED] doxycycline (VIBRA-TABS) 100 MG tablet Take 1 tablet (100 mg total) by mouth 2 (two) times daily. 1 po bid  . [DISCONTINUED] fenofibrate 160 MG tablet Take 1 tablet (160 mg total) by mouth daily.  . [DISCONTINUED] glipiZIDE (GLUCOTROL) 10 MG tablet Take 1 tablet (10 mg total) by mouth 2 (two) times daily before a meal.  . [DISCONTINUED] HYDROcodone-homatropine (HYCODAN) 5-1.5 MG/5ML syrup Take 5 mLs by mouth every 6 (six) hours as needed for cough.  . [DISCONTINUED] lisinopril-hydrochlorothiazide (PRINZIDE,ZESTORETIC) 20-25 MG tablet Take 1 tablet by mouth daily.  . [DISCONTINUED] metFORMIN (GLUCOPHAGE) 1000 MG tablet TAKE 1 TABLET TWICE DAILY WITH A MEAL.  . [DISCONTINUED] umeclidinium-vilanterol (ANORO ELLIPTA) 62.5-25 MCG/INH AEPB Inhale 1 puff into the lungs daily.   No facility-administered encounter medications on file as of 09/25/2018.     Allergies (verified) Penicillins; Eggs or egg-derived products; and Peanut-containing drug products   History: Past Medical History:  Diagnosis Date  . COPD (chronic obstructive pulmonary disease) ([place])   . Hyperlipidemia   . Hypertension   . Neuropathy    patient reported   Past Surgical History:  Procedure Laterality Date  . BACK SURGERY  1990s  . COLONOSCOPY N/A 02/25/2018   Procedure: COLONOSCOPY;  Surgeon: Doran Stabler, MD;  Location: [facility];  Service: Gastroenterology;  Laterality: N/A;  . HERNIA REPAIR    . NECK SURGERY    . POLYPECTOMY  02/25/2018   Procedure: POLYPECTOMY;  Surgeon: Doran Stabler, MD;  Location: [facility];  Service: Gastroenterology;;  .  TONSILLECTOMY     Family History  Problem Relation Age of Onset  . Diabetes Mother   . Hypertension Mother   . Hyperlipidemia Mother   . Arthritis Mother   . Osteoporosis Mother   . Diabetes Father   . Hypertension Father   . Hyperlipidemia Father   . Heart attack Father 22       Late 46s and 68s   . Cancer Paternal Grandmother        colon  . Healthy Sister   . Healthy Brother   . Healthy Daughter   . Healthy Son   . Healthy Son   . Healthy Son    Social History   Socioeconomic History  . Marital status: Married    Spouse name: Not on file  . Number of children: 4  . Years of education: Not on file  . Highest education level: 12th grade  Occupational History  . Occupation: Disabled    Comment: Back problems  Social Needs  . Financial resource strain: Not hard at all  . Food insecurity:    Worry: Never true    Inability: Never true  . Transportation needs:    Medical: No    Non-medical: No  Tobacco Use  . Smoking status: Former Smoker    Types: Cigarettes    Last attempt to quit: 11/26/2008    Years since quitting: 9.8  . Smokeless tobacco: Never Used  Substance and Sexual Activity  . Alcohol use: No  . Drug use: No  . Sexual activity: Not on file  Lifestyle  . Physical activity:    Days per week: 7 days    Minutes per session: 30 min  . Stress: Only a little  Relationships  . Social connections:    Talks on phone: More than three times a week    Gets together: More than three times a week    Attends religious service: Never    Active member of club or organization: No    Attends meetings of clubs or organizations: Never    Relationship status: Married  Other Topics Concern  . Not on file  Social History Narrative  . Not on file     Clinical Intake:     Pain Score: 5                   Activities of Daily Living In your present state of health, do you have any difficulty performing the following activities: 09/25/2018  Hearing? Y  Comment Has had hearing evaluated years ago, plans to get hearing aids soon  Vision? Y  Comment Plans to go for eye exam within the next month  Difficulty concentrating or making decisions? N  Walking or climbing stairs? Y  Comment Due to back pain  Dressing or bathing? N  Doing errands, shopping? N    Preparing Food and eating ? N  Using the Toilet? N  In the past six months, have you accidently leaked urine? N  Do you have problems with loss of bowel control? N  Managing your Medications? N  Managing your Finances? N  Housekeeping or managing your Housekeeping? N  Some recent data might be hidden     Exercise Current Exercise Habits: Home exercise routine, Type of exercise: walking, Time (Minutes): 30, Frequency (Times/Week): 7, Weekly Exercise (Minutes/Week): 210, Intensity: Mild, Exercise limited by: orthopedic condition(s)  Diet Consumes 3 meals a day and 0 snacks a day.  The patient feels that he mostly follow  a Regular diet.  Diet History Patient eats most of his meals at home.  His wife cooks supper most evenings.  He avoids eating bread and fried foods.  His wife states she usually bakes their food.  Healthy cooking options discussed.  Patient has access to all the food he needs.   Depression Screen PHQ 2/9 Scores 09/25/2018 09/25/2018 07/04/2018 04/02/2018 01/02/2018 12/04/2017 10/01/2017  PHQ - 2 Score 0 0 0 0 0 0 0     Fall Risk Fall Risk  09/25/2018 09/25/2018 07/04/2018 04/02/2018 01/02/2018  Falls in the past year? 0 0 0 No No  Comment - - - - -  Number falls in past yr: - - - - -  Follow up - - - - -     Objective:    Today's Vitals   09/25/18 0843 09/25/18 0922  BP: (!) 153/87 (!) 140/92  Pulse: 72   Weight: (!) 326 lb (147.9 kg)   Height: 5\' 11"  (1.803 m)   PainSc: 5    PainLoc: Back    Body mass index is 45.47 kg/m.  Advanced Directives 09/25/2018 02/25/2018  Does Patient Have a Medical Advance Directive? No No  Would patient like information on creating a medical advance directive? Yes (MAU/Ambulatory/Procedural Areas - Information given) No - Patient declined    Hearing/Vision  No hearing or vision deficits noted during visit. Patient reports hearing and vision deficits. He has had hearing evaluated years ago, and was told he needed hearing aids.  He  did not get them at that time, but plans to get them in the near future.   He has not has his eye examined since he was a child.  Strongly encouraged him to go for eye exam.  Offered to schedule appointment.  Patient states he will go with his wife to the office she goes to in [place].  Encouraged him to go within the next month.    Cognitive Function: MMSE - Mini Mental State Exam 09/25/2018 06/10/2017  Orientation to time 5 5  Orientation to Place 4 5  Registration 3 3  Attention/ Calculation 0 0  Attention/Calculation-comments Patient did not attempt not attempted  Recall 2 3  Language- name 2 objects 2 2  Language- repeat 1 1  Language- follow 3 step command 3 3  Language- read & follow direction 1 1  Write a sentence 0 0  Copy design 1 0  Total score 22 23        Immunizations and Health Maintenance Immunization History  Administered Date(s) Administered  . Tdap 02/01/2014   Health Maintenance Due  Topic Date Due  . OPHTHALMOLOGY EXAM  10/17/1971   Health Maintenance  Topic Date Due  . OPHTHALMOLOGY EXAM  10/17/1971  . HIV Screening  10/02/2018 (Originally 10/16/1976)  . PNEUMOCOCCAL POLYSACCHARIDE VACCINE AGE 90-64 HIGH RISK  07/05/2019 (Originally 10[birth year]17/1963)  . HEMOGLOBIN A1C  02/06/2019  . FOOT EXAM  07/05/2019  . TETANUS/TDAP  02/02/2024  . COLONOSCOPY  02/26/2028  . Hepatitis C Screening  Completed   Declined pneumonia and shingles vaccines today.  Strongly encouraged him to go for eye exam within the next month.      Assessment:   This is a routine wellness examination for [place].    Plan:    Goals    . Weight (lb) < 300 lb (136.1 kg) (pt-stated)     Continue to make healthier food choices and increase walking to at least 45 minutes per day.  Health Maintenance & Additional Screening Recommendations: Pneumococcal vaccine  Advanced directives: has NO advanced directive  - add't info requested. Referral to SW: no  Lung: Low Dose CT  Chest recommended if Age 24-80 years, 30 pack-year currently smoking OR have quit w/in 15years. Patient does not qualify. Hepatitis C Screening recommended: Completed 08/04/2015 HIV screening recommended: yes   Keep f/u with Chevis Pretty, FNP and any other specialty appointments you may have Continue current medications Move carefully to avoid falls.  Aim for at least 150 minutes of moderate activity a week.  Read or work on puzzles daily Stay connected with friends and family  I have personally reviewed and noted the following in the patient's chart:   . Medical and social history . Use of alcohol, tobacco or illicit drugs  . Current medications and supplements . Functional ability and status . Nutritional status . Physical activity . Advanced directives . List of other physicians . Hospitalizations, surgeries, and ER visits in previous 12 months . Vitals . Screenings to include cognitive, depression, and falls . Referrals and appointments  In addition, I have reviewed and discussed with patient certain preventive protocols, quality metrics, and best practice recommendations. A written personalized care plan for preventive services as well as general preventive health recommendations were provided to patient.     Nolberto Hanlon, RN  09/25/2018     I have reviewed and agree with the above AWV documentation.   Mary-Margaret Hassell Done, FNP

## 2018-09-26 LAB — CMP14+EGFR
ALK PHOS: 63 IU/L (ref 39–117)
ALT: 37 IU/L (ref 0–44)
AST: 23 IU/L (ref 0–40)
Albumin/Globulin Ratio: 2.8 — ABNORMAL HIGH (ref 1.2–2.2)
Albumin: 4.5 g/dL (ref 3.8–4.9)
BUN / CREAT RATIO: 14 (ref 9–20)
BUN: 17 mg/dL (ref 6–24)
Bilirubin Total: 0.2 mg/dL (ref 0.0–1.2)
CALCIUM: 9.7 mg/dL (ref 8.7–10.2)
CO2: 23 mmol/L (ref 20–29)
CREATININE: 1.25 mg/dL (ref 0.76–1.27)
Chloride: 99 mmol/L (ref 96–106)
GFR, EST AFRICAN AMERICAN: 74 mL/min/{1.73_m2} (ref >59)
GFR, EST NON AFRICAN AMERICAN: 64 mL/min/{1.73_m2} (ref >59)
GLOBULIN, TOTAL: 1.6 g/dL (ref 1.5–4.5)
Glucose: 217 mg/dL — ABNORMAL HIGH (ref 65–99)
Potassium: 4.5 mmol/L (ref 3.5–5.2)
SODIUM: 142 mmol/L (ref 134–144)
TOTAL PROTEIN: 6.1 g/dL (ref 6.0–8.5)

## 2018-09-26 LAB — LIPID PANEL
CHOL/HDL RATIO: 4.5 ratio (ref 0.0–5.0)
Cholesterol, Total: 113 mg/dL (ref 100–199)
HDL: 25 mg/dL — ABNORMAL LOW (ref >39)
LDL Calculated: 10 mg/dL (ref 0–99)
Triglycerides: 389 mg/dL — ABNORMAL HIGH (ref 0–149)
VLDL Cholesterol Cal: 78 mg/dL — ABNORMAL HIGH (ref 5–40)

## 2018-10-17 DIAGNOSIS — E781 Pure hyperglyceridemia: Secondary | ICD-10-CM | POA: Diagnosis not present

## 2018-10-17 DIAGNOSIS — E119 Type 2 diabetes mellitus without complications: Secondary | ICD-10-CM | POA: Diagnosis not present

## 2018-10-17 DIAGNOSIS — L509 Urticaria, unspecified: Secondary | ICD-10-CM | POA: Diagnosis not present

## 2018-10-17 DIAGNOSIS — I10 Essential (primary) hypertension: Secondary | ICD-10-CM | POA: Diagnosis not present

## 2018-10-20 DIAGNOSIS — L509 Urticaria, unspecified: Secondary | ICD-10-CM | POA: Diagnosis not present

## 2018-12-05 ENCOUNTER — Telehealth: Payer: Self-pay | Admitting: Nurse Practitioner

## 2018-12-16 DIAGNOSIS — B354 Tinea corporis: Secondary | ICD-10-CM | POA: Diagnosis not present

## 2018-12-30 ENCOUNTER — Ambulatory Visit (INDEPENDENT_AMBULATORY_CARE_PROVIDER_SITE_OTHER): Payer: Medicare Other | Admitting: Nurse Practitioner

## 2018-12-30 ENCOUNTER — Encounter: Payer: Self-pay | Admitting: Nurse Practitioner

## 2018-12-30 ENCOUNTER — Other Ambulatory Visit: Payer: Self-pay

## 2018-12-30 DIAGNOSIS — I10 Essential (primary) hypertension: Secondary | ICD-10-CM

## 2018-12-30 DIAGNOSIS — E1140 Type 2 diabetes mellitus with diabetic neuropathy, unspecified: Secondary | ICD-10-CM

## 2018-12-30 DIAGNOSIS — J4489 Other specified chronic obstructive pulmonary disease: Secondary | ICD-10-CM

## 2018-12-30 DIAGNOSIS — E782 Mixed hyperlipidemia: Secondary | ICD-10-CM

## 2018-12-30 DIAGNOSIS — E1142 Type 2 diabetes mellitus with diabetic polyneuropathy: Secondary | ICD-10-CM | POA: Diagnosis not present

## 2018-12-30 DIAGNOSIS — J449 Chronic obstructive pulmonary disease, unspecified: Secondary | ICD-10-CM | POA: Diagnosis not present

## 2018-12-30 NOTE — Progress Notes (Signed)
Virtual Visit via telephone Note  I connected with Kurt Blevins on 12/30/18 at 10:15 AM by video and verified that I am speaking with the correct person using two identifiers. Kurt Blevins is currently located at home his wife and no one is currently with her during visit. The provider, Mary-Margaret Hassell Done, FNP is located in their office at time of visit.  I discussed the limitations, risks, security and privacy concerns of performing an evaluation and management service by telephone and the availability of in person appointments. I also discussed with the patient that there may be a patient responsible charge related to this service. The patient expressed understanding and agreed to proceed.   History and Present Illness:   Chief Complaint: Medical Management of Chronic Issues    HPI:  1. Essential hypertension No c/o chest pain,  or headache. Does not make take bloodpressure at home. BP Readings from Last 3 Encounters:  09/25/18 (!) 140/92  09/25/18 (!) 140/92  08/08/18 135/87     2. Type 2 diabetes mellitus with diabetic polyneuropathy, without long-term current use of insulin (HCC) Last HGBA1c was 7.4%. his blood sugars at home are runnning around 85-130. He denies any low blood sugars.  3. Neuropathy due to type 2 diabetes mellitus ([place]) Feet burn all the time. Some numbness of big toes bil.  4. Hyperlipidemia, unspecified hyperlipidemia type Does not really watch diet. Not able to exercise much due to COPD  5. COPD with asthma ([place]) Tree pollen has really bothered him- causes him to take a breathing. Has not seen pulmonology in almost a year.   6. Severe obesity (BMI >= 40) (HCC) No recent weight changes    Outpatient Encounter Medications as of 12/30/2018  Medication Sig  . aspirin 81 MG tablet Take 81 mg by mouth daily.  Kurt Blevins atorvastatin (LIPITOR) 40 MG tablet Take 1 tablet (40 mg total) by mouth daily.  . budesonide-formoterol (SYMBICORT) 160-4.5 MCG/ACT  inhaler Inhale 2 puffs into the lungs 2 (two) times daily as needed (for shortness of breath).  . canagliflozin (INVOKANA) 100 MG TABS tablet Take 1 tablet (100 mg total) by mouth daily before breakfast.  . fenofibrate 160 MG tablet Take 1 tablet (160 mg total) by mouth daily.  Kurt Blevins glipiZIDE (GLUCOTROL) 10 MG tablet Take 1 tablet (10 mg total) by mouth 2 (two) times daily before a meal.  . ipratropium-albuterol (DUONEB) 0.5-2.5 (3) MG/3ML SOLN Take 3 mLs by nebulization 3 (three) times daily as needed (for shortness of breath).  Kurt Blevins lisinopril-hydrochlorothiazide (PRINZIDE,ZESTORETIC) 20-25 MG tablet Take 1 tablet by mouth daily.  . metFORMIN (GLUCOPHAGE) 1000 MG tablet TAKE 1 TABLET TWICE DAILY WITH A MEAL.  Kurt Blevins ONETOUCH DELICA LANCETS 19E MISC Check blood sugar 1x per day and prn  E 11.9  . ONETOUCH VERIO test strip CHECK BLOOD SUGAR ONCE DAILY AS DIRECTED.  Kurt Blevins umeclidinium-vilanterol (ANORO ELLIPTA) 62.5-25 MCG/INH AEPB Inhale 1 puff into the lungs daily.     Past Surgical History:  Procedure Laterality Date  . BACK SURGERY  1990s  . COLONOSCOPY N/A 02/25/2018   Procedure: COLONOSCOPY;  Surgeon: Doran Stabler, MD;  Location: [facility];  Service: Gastroenterology;  Laterality: N/A;  . HERNIA REPAIR    . NECK SURGERY    . POLYPECTOMY  02/25/2018   Procedure: POLYPECTOMY;  Surgeon: Doran Stabler, MD;  Location: [facility];  Service: Gastroenterology;;  . TONSILLECTOMY      Family History  Problem Relation Age of Onset  .  Diabetes Mother   . Hypertension Mother   . Hyperlipidemia Mother   . Arthritis Mother   . Osteoporosis Mother   . Diabetes Father   . Hypertension Father   . Hyperlipidemia Father   . Heart attack Father 52       Late 65s and 82s  . Cancer Paternal Grandmother        colon  . Healthy Sister   . Healthy Brother   . Healthy Daughter   . Healthy Son   . Healthy Son   . Healthy Son     New complaints: None today  Social history: Lives with his wife  is on disability for his COPD     Review of Systems  Constitutional: Negative for diaphoresis and weight loss.  Eyes: Negative for blurred vision, double vision and pain.  Respiratory: Positive for cough.   Cardiovascular: Negative for chest pain, palpitations, orthopnea and leg swelling.  Gastrointestinal: Negative for abdominal pain.  Skin: Negative for rash.  Neurological: Negative for dizziness, sensory change, loss of consciousness, weakness and headaches.  Endo/Heme/Allergies: Negative for polydipsia. Does not bruise/bleed easily.  Psychiatric/Behavioral: Negative for memory loss. The patient does not have insomnia.   All other systems reviewed and are negative.      Observations/Objective: Alert and oriented Voice is hoarse Slight cough at times No distress noted  Assessment and Plan: Kurt Blevins comes in today with chief complaint of Medical Management of Chronic Issues   Diagnosis and orders addressed:  1. Essential hypertension Low sodium diet  2. Type 2 diabetes mellitus with diabetic polyneuropathy, without long-term current use of insulin (HCC) continueto watch carbs in diet  3. Neuropathy due to type 2 diabetes mellitus ([place]) Do not go barefooted  4. Mixed hyperlipidemia Low fat diet  5. COPD with asthma ([place]) Need to follow up with pulmonlogy  6. Severe obesity (BMI >= 40) (HCC) Discussed diet and exercise for person with BMI >25 Will recheck weight in 3-6 months    Previous lab results reviewed Health Maintenance reviewed Diet and exercise encouraged  Follow up plan: 3 months     I discussed the assessment and treatment plan with the patient. The patient was provided an opportunity to ask questions and all were answered. The patient agreed with the plan and demonstrated an understanding of the instructions.   The patient was advised to call back or seek an in-person evaluation if the symptoms worsen or if the condition fails to  improve as anticipated.  The above assessment and management plan was discussed with the patient. The patient verbalized understanding of and has agreed to the management plan. Patient is aware to call the clinic if symptoms persist or worsen. Patient is aware when to return to the clinic for a follow-up visit. Patient educated on when it is appropriate to go to the emergency department.   Time call ended: 10:30  I provided 15 minutes of face-to-face time during this encounter.    Mary-Margaret Hassell Done, FNP

## 2019-02-17 ENCOUNTER — Encounter: Payer: Self-pay | Admitting: Nurse Practitioner

## 2019-02-17 ENCOUNTER — Ambulatory Visit (INDEPENDENT_AMBULATORY_CARE_PROVIDER_SITE_OTHER): Payer: Medicare Other | Admitting: Nurse Practitioner

## 2019-02-17 ENCOUNTER — Telehealth: Payer: Self-pay | Admitting: Nurse Practitioner

## 2019-02-17 DIAGNOSIS — L309 Dermatitis, unspecified: Secondary | ICD-10-CM

## 2019-02-17 MED ORDER — PREDNISONE 20 MG PO TABS: ORAL_TABLET | ORAL | 0 refills | Status: DC

## 2019-02-17 MED ORDER — KETOCONAZOLE 2 % EX CREA: 1.0000 "application " | TOPICAL_CREAM | Freq: Every day | CUTANEOUS | 1 refills | Status: DC

## 2019-02-17 NOTE — Telephone Encounter (Signed)
Patient has a rash. Scheduled telelvisit

## 2019-02-17 NOTE — Progress Notes (Signed)
   Virtual Visit via telephone Note Due to COVID-19 pandemic this visit was conducted virtually. This visit type was conducted due to national recommendations for restrictions regarding the COVID-19 Pandemic (e.g. social distancing, sheltering in place) in an effort to limit this patient's exposure and mitigate transmission in our community. All issues noted in this document were discussed and addressed.  A physical exam was not performed with this format.  I connected with Kurt Blevins on 02/17/19 at 4:35 by telephone and verified that I am speaking with the correct person using two identifiers. Kurt Blevins is currently located at home and his wife is currently with her during visit. The provider, Mary-Margaret Hassell Done, FNP is located in their office at time of visit.  I discussed the limitations, risks, security and privacy concerns of performing an evaluation and management service by telephone and the availability of in person appointments. I also discussed with the patient that there may be a patient responsible charge related to this service. The patient expressed understanding and agreed to proceed. He has been use ketoconazole cream which helped for awhile.   History and Present Illness:   Chief Complaint: Rash   HPI Patient calls in c/o rash all over his body. Broke out over 2 months ago. He went to urgent care and was given some cream. No better. Rash itches some. The rash is mainly underneath his arms and down his chest on both sides.    Review of Systems  Constitutional: Negative for diaphoresis and weight loss.  Eyes: Negative for blurred vision, double vision and pain.  Respiratory: Negative for shortness of breath.   Cardiovascular: Negative for chest pain, palpitations, orthopnea and leg swelling.  Gastrointestinal: Negative for abdominal pain.  Skin: Negative for rash.  Neurological: Negative for dizziness, sensory change, loss of consciousness, weakness and headaches.   Endo/Heme/Allergies: Negative for polydipsia. Does not bruise/bleed easily.  Psychiatric/Behavioral: Negative for memory loss. The patient does not have insomnia.   All other systems reviewed and are negative.    Observations/Objective: Alert and oriented Unable to visualize rash- no video available   Assessment and Plan: Kurt Blevins in today with chief complaint of Rash   1. Dermatitis Avoid scratching if no improvement in 1 week will need to be seen - predniSONE (DELTASONE) 20 MG tablet; 2 po at sametime daily for 5 days  Dispense: 10 tablet; Refill: 0 - ketoconazole (NIZORAL) 2 % cream; Apply 1 application topically daily.  Dispense: 60 g; Refill: 1   Follow Up Instructions: prn    I discussed the assessment and treatment plan with the patient. The patient was provided an opportunity to ask questions and all were answered. The patient agreed with the plan and demonstrated an understanding of the instructions.   The patient was advised to call back or seek an in-person evaluation if the symptoms worsen or if the condition fails to improve as anticipated.  The above assessment and management plan was discussed with the patient. The patient verbalized understanding of and has agreed to the management plan. Patient is aware to call the clinic if symptoms persist or worsen. Patient is aware when to return to the clinic for a follow-up visit. Patient educated on when it is appropriate to go to the emergency department.   Time call ended:  4:45  I provided 10 minutes of non-face-to-face time during this encounter.    Mary-Margaret Hassell Done, FNP

## 2019-03-31 ENCOUNTER — Other Ambulatory Visit: Payer: Self-pay | Admitting: Nurse Practitioner

## 2019-03-31 DIAGNOSIS — I10 Essential (primary) hypertension: Secondary | ICD-10-CM

## 2019-03-31 DIAGNOSIS — E782 Mixed hyperlipidemia: Secondary | ICD-10-CM

## 2019-04-01 ENCOUNTER — Other Ambulatory Visit: Payer: Self-pay

## 2019-04-02 ENCOUNTER — Ambulatory Visit (INDEPENDENT_AMBULATORY_CARE_PROVIDER_SITE_OTHER): Payer: Medicare Other | Admitting: Nurse Practitioner

## 2019-04-02 ENCOUNTER — Encounter: Payer: Self-pay | Admitting: Nurse Practitioner

## 2019-04-02 DIAGNOSIS — J4489 Other specified chronic obstructive pulmonary disease: Secondary | ICD-10-CM

## 2019-04-02 DIAGNOSIS — I10 Essential (primary) hypertension: Secondary | ICD-10-CM

## 2019-04-02 DIAGNOSIS — E1140 Type 2 diabetes mellitus with diabetic neuropathy, unspecified: Secondary | ICD-10-CM

## 2019-04-02 DIAGNOSIS — E1142 Type 2 diabetes mellitus with diabetic polyneuropathy: Secondary | ICD-10-CM

## 2019-04-02 DIAGNOSIS — E782 Mixed hyperlipidemia: Secondary | ICD-10-CM

## 2019-04-02 DIAGNOSIS — J449 Chronic obstructive pulmonary disease, unspecified: Secondary | ICD-10-CM

## 2019-04-02 MED ORDER — FENOFIBRATE 160 MG PO TABS: 160.0000 mg | ORAL_TABLET | Freq: Every day | ORAL | 1 refills | Status: DC

## 2019-04-02 MED ORDER — GLIPIZIDE 10 MG PO TABS: 10.0000 mg | ORAL_TABLET | Freq: Two times a day (BID) | ORAL | 1 refills | Status: DC

## 2019-04-02 MED ORDER — LISINOPRIL-HYDROCHLOROTHIAZIDE 20-25 MG PO TABS: 1.0000 | ORAL_TABLET | Freq: Every day | ORAL | 1 refills | Status: DC

## 2019-04-02 MED ORDER — IPRATROPIUM-ALBUTEROL 0.5-2.5 (3) MG/3ML IN SOLN: 3.0000 mL | Freq: Three times a day (TID) | RESPIRATORY_TRACT | 5 refills | Status: DC | PRN

## 2019-04-02 MED ORDER — ATORVASTATIN CALCIUM 40 MG PO TABS: 40.0000 mg | ORAL_TABLET | Freq: Every day | ORAL | 1 refills | Status: DC

## 2019-04-02 MED ORDER — BUDESONIDE-FORMOTEROL FUMARATE 160-4.5 MCG/ACT IN AERO: 2.0000 | INHALATION_SPRAY | Freq: Two times a day (BID) | RESPIRATORY_TRACT | 5 refills | Status: DC | PRN

## 2019-04-02 MED ORDER — METFORMIN HCL 1000 MG PO TABS: ORAL_TABLET | ORAL | 1 refills | Status: DC

## 2019-04-02 MED ORDER — ANORO ELLIPTA 62.5-25 MCG/INH IN AEPB: 1.0000 | INHALATION_SPRAY | Freq: Every day | RESPIRATORY_TRACT | 3 refills | Status: DC

## 2019-04-02 NOTE — Progress Notes (Signed)
Virtual Visit via telephone Note Due to COVID-19 pandemic this visit was conducted virtually. This visit type was conducted due to national recommendations for restrictions regarding the COVID-19 Pandemic (e.g. social distancing, sheltering in place) in an effort to limit this patient's exposure and mitigate transmission in our community. All issues noted in this document were discussed and addressed.  A physical exam was not performed with this format.  I connected with [name] on 04/02/19 at 9:55 by telephone and verified that I am speaking with the correct person using two identifiers. Kurt Blevins is currently located at home and  His wife is currently with him during visit. The provider, Mary-Margaret Hassell Done, FNP is located in their office at time of visit.  I discussed the limitations, risks, security and privacy concerns of performing an evaluation and management service by telephone and the availability of in person appointments. I also discussed with the patient that there may be a patient responsible charge related to this service. The patient expressed understanding and agreed to proceed.   History and Present Illness:   Chief Complaint: Medical Management of Chronic Issues    HPI:  1. Essential hypertension No c/o chest pain or headaches. He dos not check blood pressure at home very often. BP Readings from Last 3 Encounters:  09/25/18 (!) 140/92  09/25/18 (!) 140/92  08/08/18 135/87     2. Mixed hyperlipidemia Does not watch diet and does no exercise Lab Results  Component Value Date   CHOL 113 09/25/2018   HDL 25 (L) 09/25/2018   LDLCALC 10 09/25/2018   TRIG 389 (H) 09/25/2018   CHOLHDL 4.5 09/25/2018     3. Type 2 diabetes mellitus with diabetic polyneuropathy, without long-term current use of insulin (HCC) His blood sugars are up and down. Range from 110-170 and sometimes is over 200. He denies any low blood sugars. Lab Results  Component Value Date    HGBA1C 7.4 (H) 09/25/2018     4. Neuropathy due to type 2 diabetes mellitus (HCC) His feet burn and tingle al lthe time. He denies any sores on feet. Does have callus on heels. Toe nails are long and thick.  5. COPD with asthma ([place]) He stays sob. Is not bale to do much with out getting SOB. Cannot go out in the heat at all. His pulmonologist has retired and he needs a new one, but wants to wait after covid is over.  6. Severe obesity (BMI >= 40) (HCC) Has had no recent weight loss    Outpatient Encounter Medications as of 04/02/2019  Medication Sig  . aspirin 81 MG tablet Take 81 mg by mouth daily.  Marland Kitchen atorvastatin (LIPITOR) 40 MG tablet TAKE 1 TABLET ONCE DAILY.  . budesonide-formoterol (SYMBICORT) 160-4.5 MCG/ACT inhaler Inhale 2 puffs into the lungs 2 (two) times daily as needed (for shortness of breath).  . canagliflozin (INVOKANA) 100 MG TABS tablet Take 1 tablet (100 mg total) by mouth daily before breakfast.  . fenofibrate 160 MG tablet TAKE 1 TABLET ONCE DAILY.  Marland Kitchen glipiZIDE (GLUCOTROL) 10 MG tablet Take 1 tablet (10 mg total) by mouth 2 (two) times daily before a meal.  . ipratropium-albuterol (DUONEB) 0.5-2.5 (3) MG/3ML SOLN Take 3 mLs by nebulization 3 (three) times daily as needed (for shortness of breath).  Marland Kitchen ketoconazole (NIZORAL) 2 % cream Apply 1 application topically daily.  Marland Kitchen lisinopril-hydrochlorothiazide (ZESTORETIC) 20-25 MG tablet TAKE 1 TABLET ONCE DAILY.  . metFORMIN (GLUCOPHAGE) 1000 MG tablet TAKE  1 TABLET TWICE DAILY WITH A MEAL.  Marland Kitchen ONETOUCH DELICA LANCETS 99991111 MISC Check blood sugar 1x per day and prn  E 11.9  . ONETOUCH VERIO test strip CHECK BLOOD SUGAR ONCE DAILY AS DIRECTED.  Marland Kitchen predniSONE (DELTASONE) 20 MG tablet 2 po at sametime daily for 5 days  . umeclidinium-vilanterol (ANORO ELLIPTA) 62.5-25 MCG/INH AEPB Inhale 1 puff into the lungs daily.     Past Surgical History:  Procedure Laterality Date  . BACK SURGERY  1990s  . COLONOSCOPY N/A 02/25/2018    Procedure: COLONOSCOPY;  Surgeon: Doran Stabler, MD;  Location: [facility];  Service: Gastroenterology;  Laterality: N/A;  . HERNIA REPAIR    . NECK SURGERY    . POLYPECTOMY  02/25/2018   Procedure: POLYPECTOMY;  Surgeon: Doran Stabler, MD;  Location: [facility];  Service: Gastroenterology;;  . TONSILLECTOMY      Family History  Problem Relation Age of Onset  . Diabetes Mother   . Hypertension Mother   . Hyperlipidemia Mother   . Arthritis Mother   . Osteoporosis Mother   . Diabetes Father   . Hypertension Father   . Hyperlipidemia Father   . Heart attack Father 67       Late 73s and 87s  . Cancer Paternal Grandmother        colon  . Healthy Sister   . Healthy Brother   . Healthy Daughter   . Healthy Son   . Healthy Son   . Healthy Son     New complaints: None today  Social history: Lives with his wife. He is retired and on disability.  Controlled substance contract: n/a    ROS   Observations/Objective: Alert and oriented- answers all questions appropriately No distress Slight SOB noted when coughing   Assessment and Plan: FABION JEMMOTT comes in today with chief complaint of Medical Management of Chronic Issues   Diagnosis and orders addressed:  1. Essential hypertension Low sodium diet - lisinopril-hydrochlorothiazide (ZESTORETIC) 20-25 MG tablet; Take 1 tablet by mouth daily.  Dispense: 90 tablet; Refill: 1  2. Mixed hyperlipidemia Low fat diet - fenofibrate 160 MG tablet; Take 1 tablet (160 mg total) by mouth daily.  Dispense: 90 tablet; Refill: 1 - atorvastatin (LIPITOR) 40 MG tablet; Take 1 tablet (40 mg total) by mouth daily.  Dispense: 90 tablet; Refill: 1  3. Type 2 diabetes mellitus with diabetic polyneuropathy, without long-term current use of insulin (HCC) continuet owatch carbsin diet - glipiZIDE (GLUCOTROL) 10 MG tablet; Take 1 tablet (10 mg total) by mouth 2 (two) times daily before a meal.  Dispense: 180 tablet; Refill: 1  - metFORMIN (GLUCOPHAGE) 1000 MG tablet; TAKE 1 TABLET TWICE DAILY WITH A MEAL.  Dispense: 180 tablet; Refill: 1  4. Neuropathy due to type 2 diabetes mellitus ([place]) Do not go barefooted  5. COPD with asthma ([place]) Continue all inhalers Avoid heat - umeclidinium-vilanterol (ANORO ELLIPTA) 62.5-25 MCG/INH AEPB; Inhale 1 puff into the lungs daily.  Dispense: 4 each; Refill: 3 - ipratropium-albuterol (DUONEB) 0.5-2.5 (3) MG/3ML SOLN; Take 3 mLs by nebulization 3 (three) times daily as needed (for shortness of breath).  Dispense: 3 mL; Refill: 5  6. Severe obesity (BMI >= 40) (HCC) Discussed diet and exercise for person with BMI >25 Will recheck weight in 3-6 months   Previous labs reviewed Health Maintenance reviewed Diet and exercise encouraged  Follow up plan: 3 months    I discussed the assessment and treatment plan with  the patient. The patient was provided an opportunity to ask questions and all were answered. The patient agreed with the plan and demonstrated an understanding of the instructions.   The patient was advised to call back or seek an in-person evaluation if the symptoms worsen or if the condition fails to improve as anticipated.  The above assessment and management plan was discussed with the patient. The patient verbalized understanding of and has agreed to the management plan. Patient is aware to call the clinic if symptoms persist or worsen. Patient is aware when to return to the clinic for a follow-up visit. Patient educated on when it is appropriate to go to the emergency department.   Time call ended:  10:10  I provided 15 minutes of non-face-to-face time during this encounter.    Mary-Margaret Hassell Done, FNP

## 2019-04-10 LAB — HM DIABETES EYE EXAM

## 2019-05-01 ENCOUNTER — Ambulatory Visit (INDEPENDENT_AMBULATORY_CARE_PROVIDER_SITE_OTHER): Payer: Medicare Other | Admitting: Family

## 2019-05-01 ENCOUNTER — Encounter: Payer: Self-pay | Admitting: Family

## 2019-05-01 ENCOUNTER — Other Ambulatory Visit: Payer: Self-pay

## 2019-05-01 DIAGNOSIS — J441 Chronic obstructive pulmonary disease with (acute) exacerbation: Secondary | ICD-10-CM | POA: Diagnosis not present

## 2019-05-01 MED ORDER — DOXYCYCLINE HYCLATE 100 MG PO TABS: 100.0000 mg | ORAL_TABLET | Freq: Two times a day (BID) | ORAL | 0 refills | Status: DC

## 2019-05-01 MED ORDER — PREDNISONE 10 MG (21) PO TBPK: ORAL_TABLET | ORAL | 0 refills | Status: DC

## 2019-05-01 NOTE — Progress Notes (Signed)
Virtual Visit via telephone Note Due to COVID-19 pandemic this visit was conducted virtually. This visit type was conducted due to national recommendations for restrictions regarding the COVID-19 Pandemic (e.g. social distancing, sheltering in place) in an effort to limit this patient's exposure and mitigate transmission in our community. All issues noted in this document were discussed and addressed.  A physical exam was not performed with this format.  I connected with Kurt Blevins on 05/01/19 at 10:55 AM by telephone and verified that I am speaking with the correct person using two identifiers. Kurt Blevins is currently located at home and no one is currently with him during visit. The provider, Evelina Dun, FNP is located in their office at time of visit.  I discussed the limitations, risks, security and privacy concerns of performing an evaluation and management service by telephone and the availability of in person appointments. I also discussed with the patient that there may be a patient responsible charge related to this service. The patient expressed understanding and agreed to proceed.   History and Present Illness:  Cough This is a recurrent problem. The current episode started in the past 7 days. The problem has been unchanged. The problem occurs every few minutes. The cough is productive of sputum. Associated symptoms include ear congestion, ear pain, headaches, nasal congestion, postnasal drip, a sore throat and shortness of breath. Pertinent negatives include no chills, fever, myalgias or wheezing. The symptoms are aggravated by lying down. Risk factors: grandchildren diangosed with strep. He has tried rest, OTC cough suppressant and steroid inhaler for the symptoms. The treatment provided mild relief. His past medical history is significant for COPD.      Review of Systems  Constitutional: Negative for chills and fever.  HENT: Positive for ear pain, postnasal drip and  sore throat.   Respiratory: Positive for cough and shortness of breath. Negative for wheezing.   Musculoskeletal: Negative for myalgias.  Neurological: Positive for headaches.     Observations/Objective: No SOB or distress noted  Assessment and Plan: 1. Chronic obstructive pulmonary disease with acute exacerbation ([place]) PT will go get COVID tested to rule this out If SOB or symptoms worsens he will go to ED - Take meds as prescribed - Use a cool mist humidifier  -Use saline nose sprays frequently -Force fluids -For any cough or congestion  Use plain Mucinex- regular strength or max strength is fine -For fever or aces or pains- take tylenol or ibuprofen. -Call if no improvement in symptoms  - predniSONE (STERAPRED UNI-PAK 21 TAB) 10 MG (21) TBPK tablet; Use as directed  Dispense: 21 tablet; Refill: 0 - doxycycline (VIBRA-TABS) 100 MG tablet; Take 1 tablet (100 mg total) by mouth 2 (two) times daily.  Dispense: 20 tablet; Refill: 0     I discussed the assessment and treatment plan with the patient. The patient was provided an opportunity to ask questions and all were answered. The patient agreed with the plan and demonstrated an understanding of the instructions.   The patient was advised to call back or seek an in-person evaluation if the symptoms worsen or if the condition fails to improve as anticipated.  The above assessment and management plan was discussed with the patient. The patient verbalized understanding of and has agreed to the management plan. Patient is aware to call the clinic if symptoms persist or worsen. Patient is aware when to return to the clinic for a follow-up visit. Patient educated on when it is appropriate to  go to the emergency department.   Time call ended:  11:05 AM  I provided 10 minutes of non-face-to-face time during this encounter.    Evelina Dun, FNP

## 2019-05-22 ENCOUNTER — Encounter: Payer: Self-pay | Admitting: *Deleted

## 2019-05-22 ENCOUNTER — Telehealth: Payer: Self-pay | Admitting: *Deleted

## 2019-05-22 NOTE — Telephone Encounter (Signed)
Called the patient to inquire as to whether or not he wanted to be scheduled for his recall colon. No answer, unable to LM, VM full. Sent patient a Therapist, music.

## 2019-06-26 ENCOUNTER — Other Ambulatory Visit: Payer: Self-pay | Admitting: Nurse Practitioner

## 2019-06-26 DIAGNOSIS — J449 Chronic obstructive pulmonary disease, unspecified: Secondary | ICD-10-CM

## 2019-07-01 ENCOUNTER — Other Ambulatory Visit: Payer: Self-pay | Admitting: *Deleted

## 2019-07-01 ENCOUNTER — Telehealth: Payer: Self-pay | Admitting: *Deleted

## 2019-07-01 ENCOUNTER — Other Ambulatory Visit: Payer: Self-pay

## 2019-07-01 DIAGNOSIS — Z86010 Personal history of colonic polyps: Secondary | ICD-10-CM

## 2019-07-01 DIAGNOSIS — R195 Other fecal abnormalities: Secondary | ICD-10-CM

## 2019-07-01 NOTE — Telephone Encounter (Signed)
19 polyps removed 02/2018, so high risk recall.  It is his choice to wait if he prefers.  6 month recall appropriate, given current projected national COVID course over coming months.

## 2019-07-01 NOTE — Telephone Encounter (Signed)
FYI Dr. Loletha Carrow,   Spoke with the patient and his wife prior to scheduling colonoscopy at [facility] after a few months of unsuccessfully trying to reach him by phone and patient messaging. They both were agreeable with the plan and asked me to schedule all required visits. After scheduling the hospital procedure, COVID screening and pre op visit, this RN called the patient and his wife back to notify them of the scheduled times and dates, they both stated that they changed their minds and did not want to be scheduled due to [place]. The patient stated "I'll die if I catch it." The patient said he would call when COVID "was over." Patient taken off the wait list, new 6 month recall placed (so we don't lose track of him), all appointments cancelled at the request of the patient.

## 2019-07-02 ENCOUNTER — Ambulatory Visit (INDEPENDENT_AMBULATORY_CARE_PROVIDER_SITE_OTHER): Payer: Medicare Other | Admitting: Nurse Practitioner

## 2019-07-02 ENCOUNTER — Ambulatory Visit (INDEPENDENT_AMBULATORY_CARE_PROVIDER_SITE_OTHER): Payer: Medicare Other

## 2019-07-02 ENCOUNTER — Encounter: Payer: Self-pay | Admitting: Nurse Practitioner

## 2019-07-02 VITALS — BP 142/86 | HR 71 | Temp 96.6°F | Resp 24 | Ht 71.0 in | Wt 336.0 lb

## 2019-07-02 DIAGNOSIS — I10 Essential (primary) hypertension: Secondary | ICD-10-CM

## 2019-07-02 DIAGNOSIS — R079 Chest pain, unspecified: Secondary | ICD-10-CM

## 2019-07-02 DIAGNOSIS — E1140 Type 2 diabetes mellitus with diabetic neuropathy, unspecified: Secondary | ICD-10-CM

## 2019-07-02 DIAGNOSIS — E782 Mixed hyperlipidemia: Secondary | ICD-10-CM

## 2019-07-02 DIAGNOSIS — E1142 Type 2 diabetes mellitus with diabetic polyneuropathy: Secondary | ICD-10-CM

## 2019-07-02 DIAGNOSIS — J449 Chronic obstructive pulmonary disease, unspecified: Secondary | ICD-10-CM

## 2019-07-02 LAB — BAYER DCA HB A1C WAIVED: HB A1C (BAYER DCA - WAIVED): 9.7 % — ABNORMAL HIGH (ref <7.0)

## 2019-07-02 NOTE — Telephone Encounter (Signed)
Noted  

## 2019-07-02 NOTE — Patient Instructions (Signed)
Carbohydrate Counting for Diabetes Mellitus, Adult  Carbohydrate counting is a method of keeping track of how many carbohydrates you eat. Eating carbohydrates naturally increases the amount of sugar (glucose) in the blood. Counting how many carbohydrates you eat helps keep your blood glucose within normal limits, which helps you manage your diabetes (diabetes mellitus). It is important to know how many carbohydrates you can safely have in each meal. This is different for every person. A diet and nutrition specialist (registered dietitian) can help you make a meal plan and calculate how many carbohydrates you should have at each meal and snack. Carbohydrates are found in the following foods:  Grains, such as breads and cereals.  Dried beans and soy products.  Starchy vegetables, such as potatoes, peas, and corn.  Fruit and fruit juices.  Milk and yogurt.  Sweets and snack foods, such as cake, cookies, candy, chips, and soft drinks. How do I count carbohydrates? There are two ways to count carbohydrates in food. You can use either of the methods or a combination of both. Reading "Nutrition Facts" on packaged food The "Nutrition Facts" list is included on the labels of almost all packaged foods and beverages in the [place] It includes:  The serving size.  Information about nutrients in each serving, including the grams (g) of carbohydrate per serving. To use the "Nutrition Facts":  Decide how many servings you will have.  Multiply the number of servings by the number of carbohydrates per serving.  The resulting number is the total amount of carbohydrates that you will be having. Learning standard serving sizes of other foods When you eat carbohydrate foods that are not packaged or do not include "Nutrition Facts" on the label, you need to measure the servings in order to count the amount of carbohydrates:  Measure the foods that you will eat with a food scale or measuring cup, if needed.   Decide how many standard-size servings you will eat.  Multiply the number of servings by 15. Most carbohydrate-rich foods have about 15 g of carbohydrates per serving. ? For example, if you eat 8 oz (170 g) of strawberries, you will have eaten 2 servings and 30 g of carbohydrates (2 servings x 15 g = 30 g).  For foods that have more than one food mixed, such as soups and casseroles, you must count the carbohydrates in each food that is included. The following list contains standard serving sizes of common carbohydrate-rich foods. Each of these servings has about 15 g of carbohydrates:   hamburger bun or  English muffin.   oz (15 mL) syrup.   oz (14 g) jelly.  1 slice of bread.  1 six-inch tortilla.  3 oz (85 g) cooked rice or pasta.  4 oz (113 g) cooked dried beans.  4 oz (113 g) starchy vegetable, such as peas, corn, or potatoes.  4 oz (113 g) hot cereal.  4 oz (113 g) mashed potatoes or  of a large baked potato.  4 oz (113 g) canned or frozen fruit.  4 oz (120 mL) fruit juice.  4-6 crackers.  6 chicken nuggets.  6 oz (170 g) unsweetened dry cereal.  6 oz (170 g) plain fat-free yogurt or yogurt sweetened with artificial sweeteners.  8 oz (240 mL) milk.  8 oz (170 g) fresh fruit or one small piece of fruit.  24 oz (680 g) popped popcorn. Example of carbohydrate counting Sample meal  3 oz (85 g) chicken breast.  6 oz (170 g)   brown rice.  4 oz (113 g) corn.  8 oz (240 mL) milk.  8 oz (170 g) strawberries with sugar-free whipped topping. Carbohydrate calculation 1. Identify the foods that contain carbohydrates: ? Rice. ? Corn. ? Milk. ? Strawberries. 2. Calculate how many servings you have of each food: ? 2 servings rice. ? 1 serving corn. ? 1 serving milk. ? 1 serving strawberries. 3. Multiply each number of servings by 15 g: ? 2 servings rice x 15 g = 30 g. ? 1 serving corn x 15 g = 15 g. ? 1 serving milk x 15 g = 15 g. ? 1 serving  strawberries x 15 g = 15 g. 4. Add together all of the amounts to find the total grams of carbohydrates eaten: ? 30 g + 15 g + 15 g + 15 g = 75 g of carbohydrates total. Summary  Carbohydrate counting is a method of keeping track of how many carbohydrates you eat.  Eating carbohydrates naturally increases the amount of sugar (glucose) in the blood.  Counting how many carbohydrates you eat helps keep your blood glucose within normal limits, which helps you manage your diabetes.  A diet and nutrition specialist (registered dietitian) can help you make a meal plan and calculate how many carbohydrates you should have at each meal and snack. This information is not intended to replace advice given to you by your health care provider. Make sure you discuss any questions you have with your health care provider. Document Released: 07/09/2005 Document Revised: 01/31/2017 Document Reviewed: 12/21/2015 Elsevier Patient Education  2020 Elsevier Inc.  

## 2019-07-02 NOTE — Progress Notes (Signed)
Subjective:    Patient ID: Kurt Blevins, male    DOB: April 07, 1961, 58 y.o.   MRN: [id number]   Chief Complaint: Medical Management of Chronic Issues (? broken rib)    HPI:  1. Essential hypertension No c/o chest pain,  or headache. Does not check blood pressure at home. He said he got aggravted out in lobby that increased blood pressure BP Readings from Last 3 Encounters:  07/02/19 (!) 176/96  09/25/18 (!) 140/92  09/25/18 (!) 140/92     2. Mixed hyperlipidemia Does not watch diet and does very little if any exercise  3. Type 2 diabetes mellitus with diabetic polyneuropathy, without long-term current use of insulin ([place]) He says his blood sugars are running 140. He denies any low blood sugars Lab Results  Component Value Date   HGBA1C 7.4 (H) 09/25/2018     4. Right-sided chest pain Thinks he has a rib fracture from coughing.  5. Neuropathy due to type 2 diabetes mellitus (HCC) Has constant numbness of bil feet  6. COPD with asthma ([place]) He is on anoro, symbicort and duoneb. Has chronic cough. SOB with activity. Constant wheezing. Does not see pulmonology  7. Severe obesity (BMI >= 40) (HCC) Weight is up 11bs from previous face to face visit Wt Readings from Last 3 Encounters:  07/02/19 (!) 336 lb (152.4 kg)  09/25/18 (!) 326 lb (147.9 kg)  09/25/18 (!) 326 lb (147.9 kg)       Outpatient Encounter Medications as of 07/02/2019  Medication Sig  . aspirin 81 MG tablet Take 81 mg by mouth daily.  Marland Kitchen atorvastatin (LIPITOR) 40 MG tablet Take 1 tablet (40 mg total) by mouth daily.  . budesonide-formoterol (SYMBICORT) 160-4.5 MCG/ACT inhaler Inhale 2 puffs into the lungs 2 (two) times daily as needed (for shortness of breath).  . fenofibrate 160 MG tablet Take 1 tablet (160 mg total) by mouth daily.  Marland Kitchen glipiZIDE (GLUCOTROL) 10 MG tablet Take 1 tablet (10 mg total) by mouth 2 (two) times daily before a meal.  . ipratropium-albuterol (DUONEB) 0.5-2.5 (3) MG/3ML SOLN  USE 3 MLS IN NEBULIZER AND INHALE INTO LUNGS 3 TIMES DAILY AS NEEDED FOR SHORTNESS OF BREATH.  Marland Kitchen ketoconazole (NIZORAL) 2 % cream Apply 1 application topically daily.  Marland Kitchen lisinopril-hydrochlorothiazide (ZESTORETIC) 20-25 MG tablet Take 1 tablet by mouth daily.  . metFORMIN (GLUCOPHAGE) 1000 MG tablet TAKE 1 TABLET TWICE DAILY WITH A MEAL.  Marland Kitchen ONETOUCH DELICA LANCETS 03T MISC Check blood sugar 1x per day and prn  E 11.9  . ONETOUCH VERIO test strip CHECK BLOOD SUGAR ONCE DAILY AS DIRECTED.  Marland Kitchen predniSONE (STERAPRED UNI-PAK 21 TAB) 10 MG (21) TBPK tablet Use as directed  . umeclidinium-vilanterol (ANORO ELLIPTA) 62.5-25 MCG/INH AEPB Inhale 1 puff into the lungs daily.     Past Surgical History:  Procedure Laterality Date  . BACK SURGERY  1990s  . COLONOSCOPY N/A 02/25/2018   Procedure: COLONOSCOPY;  Surgeon: Doran Stabler, MD;  Location: [facility];  Service: Gastroenterology;  Laterality: N/A;  . HERNIA REPAIR    . NECK SURGERY    . POLYPECTOMY  02/25/2018   Procedure: POLYPECTOMY;  Surgeon: Doran Stabler, MD;  Location: [facility];  Service: Gastroenterology;;  . TONSILLECTOMY      Family History  Problem Relation Age of Onset  . Diabetes Mother   . Hypertension Mother   . Hyperlipidemia Mother   . Arthritis Mother   . Osteoporosis Mother   .  Diabetes Father   . Hypertension Father   . Hyperlipidemia Father   . Heart attack Father 6       Late 49s and 1s  . Cancer Paternal Grandmother        colon  . Healthy Sister   . Healthy Brother   . Healthy Daughter   . Healthy Son   . Healthy Son   . Healthy Son     New complaints: None today other then flank pain discussed earlier in note.  Social history: Lives with wife- heis on disability for his breathing  Controlled substance contract: n/a    Review of Systems  Constitutional: Negative for diaphoresis.  Eyes: Negative for pain.  Respiratory: Positive for cough and shortness of breath.   Cardiovascular:  Positive for leg swelling. Negative for chest pain and palpitations.  Gastrointestinal: Negative for abdominal pain.  Endocrine: Negative for polydipsia.  Skin: Negative for rash.  Neurological: Negative for dizziness, weakness and headaches.  Hematological: Does not bruise/bleed easily.  All other systems reviewed and are negative.      Objective:   Physical Exam Vitals and nursing note reviewed.  Constitutional:      Appearance: Normal appearance. He is well-developed.  HENT:     Head: Normocephalic.     Nose: Nose normal.  Eyes:     Pupils: Pupils are equal, round, and reactive to light.  Neck:     Thyroid: No thyroid mass or thyromegaly.     Vascular: No carotid bruit or JVD.     Trachea: Phonation normal.  Cardiovascular:     Rate and Rhythm: Normal rate and regular rhythm.  Pulmonary:     Effort: Pulmonary effort is normal. No respiratory distress.     Breath sounds: Normal breath sounds.  Abdominal:     General: Bowel sounds are normal.     Palpations: Abdomen is soft.     Tenderness: There is no abdominal tenderness.  Musculoskeletal:        General: Normal range of motion.     Cervical back: Normal range of motion and neck supple.  Lymphadenopathy:     Cervical: No cervical adenopathy.  Skin:    General: Skin is warm and dry.  Neurological:     Mental Status: He is alert and oriented to person, place, and time.  Psychiatric:        Behavior: Behavior normal.        Thought Content: Thought content normal.        Judgment: Judgment normal.    BP (!) 142/86 (BP Location: Left Arm, Cuff Size: Large)   Pulse 71   Temp (!) 96.6 F (35.9 C) (Temporal)   Resp (!) 24   Ht _0  (1.803 m)   Wt (!) 336 lb (152.4 kg)   SpO2 92%   BMI 46.86 kg/m   HGba1c 9.7%        Assessment & Plan:  Kurt Blevins comes in today with chief complaint of Medical Management of Chronic Issues (? broken rib)   Diagnosis and orders addressed:  1. Essential  hypertension Low sodium diet - CMP14+EGFR  2. Mixed hyperlipidemia Low fat diet - Lipid panel  3. Type 2 diabetes mellitus with diabetic polyneuropathy, without long-term current use of insulin (HCC) Strict carb counting Will follo wup in 1 month to see if need to change meds - hgba1c - Microalbumin / creatinine urine ratio  4. Right-sided chest pain Probable pulled muscle spling area with pillow  when coughing - DG Chest 2 View; Future  5. Neuropathy due to type 2 diabetes mellitus ([place]) Do not go barefooted  6. COPD with asthma ([place]) Continue inhalers  7. Severe obesity (BMI >= 40) (HCC) Discussed diet and exercise for person with BMI >25 Will recheck weight in 3-6 months   Labs pending Health Maintenance reviewed Diet and exercise encouraged  Follow up plan: 1 month   [place], FNP

## 2019-07-03 LAB — CMP14+EGFR
ALT: 40 IU/L (ref 0–44)
AST: 33 IU/L (ref 0–40)
Albumin/Globulin Ratio: 2.3 — ABNORMAL HIGH (ref 1.2–2.2)
Albumin: 4.2 g/dL (ref 3.8–4.9)
Alkaline Phosphatase: 74 IU/L (ref 39–117)
BUN/Creatinine Ratio: 11 (ref 9–20)
BUN: 12 mg/dL (ref 6–24)
Bilirubin Total: 0.4 mg/dL (ref 0.0–1.2)
CO2: 22 mmol/L (ref 20–29)
Calcium: 9.5 mg/dL (ref 8.7–10.2)
Chloride: 99 mmol/L (ref 96–106)
Creatinine, Ser: 1.08 mg/dL (ref 0.76–1.27)
GFR calc Af Amer: 88 mL/min/{1.73_m2} (ref >59)
GFR calc non Af Amer: 76 mL/min/{1.73_m2} (ref >59)
Globulin, Total: 1.8 g/dL (ref 1.5–4.5)
Glucose: 149 mg/dL — ABNORMAL HIGH (ref 65–99)
Potassium: 4.5 mmol/L (ref 3.5–5.2)
Sodium: 140 mmol/L (ref 134–144)
Total Protein: 6 g/dL (ref 6.0–8.5)

## 2019-07-03 LAB — LIPID PANEL
Chol/HDL Ratio: 4.4 ratio (ref 0.0–5.0)
Cholesterol, Total: 122 mg/dL (ref 100–199)
HDL: 28 mg/dL — ABNORMAL LOW (ref >39)
LDL Chol Calc (NIH): 40 mg/dL (ref 0–99)
Triglycerides: 368 mg/dL — ABNORMAL HIGH (ref 0–149)
VLDL Cholesterol Cal: 54 mg/dL — ABNORMAL HIGH (ref 5–40)

## 2019-07-21 ENCOUNTER — Other Ambulatory Visit: Payer: Self-pay | Admitting: Nurse Practitioner

## 2019-07-21 DIAGNOSIS — E119 Type 2 diabetes mellitus without complications: Secondary | ICD-10-CM

## 2019-07-29 ENCOUNTER — Other Ambulatory Visit: Payer: Self-pay

## 2019-07-30 ENCOUNTER — Ambulatory Visit (INDEPENDENT_AMBULATORY_CARE_PROVIDER_SITE_OTHER): Payer: Medicare Other | Admitting: Nurse Practitioner

## 2019-07-30 ENCOUNTER — Encounter: Payer: Self-pay | Admitting: Nurse Practitioner

## 2019-07-30 VITALS — BP 132/79 | HR 83 | Temp 97.3°F | Resp 20 | Ht 71.0 in | Wt 315.0 lb

## 2019-07-30 DIAGNOSIS — E1142 Type 2 diabetes mellitus with diabetic polyneuropathy: Secondary | ICD-10-CM

## 2019-07-30 LAB — BAYER DCA HB A1C WAIVED: HB A1C (BAYER DCA - WAIVED): 7.6 % — ABNORMAL HIGH (ref <7.0)

## 2019-07-30 NOTE — Progress Notes (Signed)
   Subjective:    Patient ID: Kurt Blevins, male    DOB: [birth year], 59 y.o.   MRN: [id number]   Chief Complaint: Diabetes   HPI Patient was seen for chronic follow up on 07/02/19. He said that fasting blood sugars were running around 140. He was not watching diet closely and no exercise. His HGBA1c was 9.7% which was up from 7.6% in January 2020. We made no medication changes. Encouraged low carb diet. Since his visit his blood sugars have been running around 70 fasting nad post parandal 130. He siad hat he quit eating bread and stopped soft drinks.   Review of Systems  Constitutional: Negative for diaphoresis.  Eyes: Negative for pain.  Respiratory: Negative for shortness of breath.   Cardiovascular: Negative for chest pain, palpitations and leg swelling.  Gastrointestinal: Negative for abdominal pain.  Endocrine: Negative for polydipsia.  Skin: Negative for rash.  Neurological: Negative for dizziness, weakness and headaches.  Hematological: Does not bruise/bleed easily.  All other systems reviewed and are negative.      Objective:   Physical Exam Vitals and nursing note reviewed.  Constitutional:      Appearance: Normal appearance.  Cardiovascular:     Rate and Rhythm: Normal rate and regular rhythm.     Heart sounds: Normal heart sounds.  Pulmonary:     Breath sounds: Normal breath sounds.  Skin:    General: Skin is warm.  Neurological:     General: No focal deficit present.     Mental Status: He is alert.  Psychiatric:        Mood and Affect: Mood normal.        Behavior: Behavior normal.    BP 132/79   Pulse 83   Temp (!) 97.3 F (36.3 C) (Temporal)   Resp 20   Ht 5\' 11"  (1.803 m)   Wt (!) 315 lb (142.9 kg)   SpO2 92%   BMI 43.93 kg/m   hgba1c 7.6.      Assessment & Plan:  Kurt Blevins in today with chief complaint of Diabetes   1. Type 2 diabetes mellitus with diabetic polyneuropathy, without long-term current use of insulin (HCC) Continue  to watch diet Keep diary of fastuing blood sugars Follow up in 3 months - hgba1c  Mary-Margaret Hassell Done, FNP

## 2019-07-30 NOTE — Patient Instructions (Signed)
Carbohydrate Counting for Diabetes Mellitus, Adult  Carbohydrate counting is a method of keeping track of how many carbohydrates you eat. Eating carbohydrates naturally increases the amount of sugar (glucose) in the blood. Counting how many carbohydrates you eat helps keep your blood glucose within normal limits, which helps you manage your diabetes (diabetes mellitus). It is important to know how many carbohydrates you can safely have in each meal. This is different for every person. A diet and nutrition specialist (registered dietitian) can help you make a meal plan and calculate how many carbohydrates you should have at each meal and snack. Carbohydrates are found in the following foods:  Grains, such as breads and cereals.  Dried beans and soy products.  Starchy vegetables, such as potatoes, peas, and corn.  Fruit and fruit juices.  Milk and yogurt.  Sweets and snack foods, such as cake, cookies, candy, chips, and soft drinks. How do I count carbohydrates? There are two ways to count carbohydrates in food. You can use either of the methods or a combination of both. Reading "Nutrition Facts" on packaged food The "Nutrition Facts" list is included on the labels of almost all packaged foods and beverages in the [place] It includes:  The serving size.  Information about nutrients in each serving, including the grams (g) of carbohydrate per serving. To use the "Nutrition Facts":  Decide how many servings you will have.  Multiply the number of servings by the number of carbohydrates per serving.  The resulting number is the total amount of carbohydrates that you will be having. Learning standard serving sizes of other foods When you eat carbohydrate foods that are not packaged or do not include "Nutrition Facts" on the label, you need to measure the servings in order to count the amount of carbohydrates:  Measure the foods that you will eat with a food scale or measuring cup, if  needed.  Decide how many standard-size servings you will eat.  Multiply the number of servings by 15. Most carbohydrate-rich foods have about 15 g of carbohydrates per serving. ? For example, if you eat 8 oz (170 g) of strawberries, you will have eaten 2 servings and 30 g of carbohydrates (2 servings x 15 g = 30 g).  For foods that have more than one food mixed, such as soups and casseroles, you must count the carbohydrates in each food that is included. The following list contains standard serving sizes of common carbohydrate-rich foods. Each of these servings has about 15 g of carbohydrates:   hamburger bun or  English muffin.   oz (15 mL) syrup.   oz (14 g) jelly.  1 slice of bread.  1 six-inch tortilla.  3 oz (85 g) cooked rice or pasta.  4 oz (113 g) cooked dried beans.  4 oz (113 g) starchy vegetable, such as peas, corn, or potatoes.  4 oz (113 g) hot cereal.  4 oz (113 g) mashed potatoes or  of a large baked potato.  4 oz (113 g) canned or frozen fruit.  4 oz (120 mL) fruit juice.  4-6 crackers.  6 chicken nuggets.  6 oz (170 g) unsweetened dry cereal.  6 oz (170 g) plain fat-free yogurt or yogurt sweetened with artificial sweeteners.  8 oz (240 mL) milk.  8 oz (170 g) fresh fruit or one small piece of fruit.  24 oz (680 g) popped popcorn. Example of carbohydrate counting Sample meal  3 oz (85 g) chicken breast.  6 oz (170 g)   brown rice.  4 oz (113 g) corn.  8 oz (240 mL) milk.  8 oz (170 g) strawberries with sugar-free whipped topping. Carbohydrate calculation 1. Identify the foods that contain carbohydrates: ? Rice. ? Corn. ? Milk. ? Strawberries. 2. Calculate how many servings you have of each food: ? 2 servings rice. ? 1 serving corn. ? 1 serving milk. ? 1 serving strawberries. 3. Multiply each number of servings by 15 g: ? 2 servings rice x 15 g = 30 g. ? 1 serving corn x 15 g = 15 g. ? 1 serving milk x 15 g = 15 g. ? 1  serving strawberries x 15 g = 15 g. 4. Add together all of the amounts to find the total grams of carbohydrates eaten: ? 30 g + 15 g + 15 g + 15 g = 75 g of carbohydrates total. Summary  Carbohydrate counting is a method of keeping track of how many carbohydrates you eat.  Eating carbohydrates naturally increases the amount of sugar (glucose) in the blood.  Counting how many carbohydrates you eat helps keep your blood glucose within normal limits, which helps you manage your diabetes.  A diet and nutrition specialist (registered dietitian) can help you make a meal plan and calculate how many carbohydrates you should have at each meal and snack. This information is not intended to replace advice given to you by your health care provider. Make sure you discuss any questions you have with your health care provider. Document Revised: 01/31/2017 Document Reviewed: 12/21/2015 Elsevier Patient Education  2020 Elsevier Inc.  

## 2019-08-13 ENCOUNTER — Other Ambulatory Visit (HOSPITAL_COMMUNITY): Payer: Medicare Other

## 2019-08-17 ENCOUNTER — Encounter (HOSPITAL_COMMUNITY): Payer: Self-pay

## 2019-08-17 ENCOUNTER — Ambulatory Visit (HOSPITAL_COMMUNITY): Admit: 2019-08-17 | Payer: Medicare Other | Admitting: Gastroenterology

## 2019-08-17 SURGERY — COLONOSCOPY WITH PROPOFOL
Anesthesia: Monitor Anesthesia Care

## 2019-09-02 ENCOUNTER — Telehealth: Payer: Self-pay | Admitting: Nurse Practitioner

## 2019-09-02 NOTE — Chronic Care Management (AMB) (Signed)
  Chronic Care Management   Note  09/02/2019 Name: Kurt Blevins MRN: [id number] DOB: 09/23/1960  Kurt Blevins is a 59 y.o. year old male who is a primary care patient of Chevis Pretty, [place]. I reached out to [name] by phone today in response to a referral sent by Mr. Kurt [facility] health plan.     Kurt Blevins was given information about Chronic Care Management services today including:  1. CCM service includes personalized support from designated clinical staff supervised by his physician, including individualized plan of care and coordination with other care providers 2. 24/7 contact phone numbers for assistance for urgent and routine care needs. 3. Service will only be billed when office clinical staff spend 20 minutes or more in a month to coordinate care. 4. Only one practitioner may furnish and bill the service in a calendar month. 5. The patient may stop CCM services at any time (effective at the end of the month) by phone call to the office staff. 6. The patient will be responsible for cost sharing (co-pay) of up to 20% of the service fee (after annual deductible is met).  Patient did not agree to enrollment in care management services and does not wish to consider at this time.  Follow up plan: The patient has been provided with contact information for the care management team and has been advised to call with any health related questions or concerns.   Noreene Larsson, [place], [place], [place] [address] Direct Dial: [phone] [email or web address] Website: [email or web address]

## 2019-09-29 ENCOUNTER — Ambulatory Visit (INDEPENDENT_AMBULATORY_CARE_PROVIDER_SITE_OTHER): Payer: Medicare Other | Admitting: *Deleted

## 2019-09-29 DIAGNOSIS — Z0000 Encounter for general adult medical examination without abnormal findings: Secondary | ICD-10-CM | POA: Diagnosis not present

## 2019-09-29 NOTE — Progress Notes (Signed)
MEDICARE ANNUAL WELLNESS VISIT  09/29/2019  Telephone Visit Disclaimer This Medicare AWV was conducted by telephone due to national recommendations for restrictions regarding the COVID-19 Pandemic (e.g. social distancing).  I verified, using two identifiers, that I am speaking with Kurt Blevins or their authorized healthcare agent. I discussed the limitations, risks, security, and privacy concerns of performing an evaluation and management service by telephone and the potential availability of an in-person appointment in the future. The patient expressed understanding and agreed to proceed.   Subjective:  Kurt Blevins is a 59 y.o. male patient of Kurt Blevins, [place] who had a Medicare Annual Wellness Visit today via telephone. Kurt Blevins is Disabled and lives with their spouse. he has 6 children. he reports that he is socially active and does interact with friends/family regularly. he is minimally physically active and enjoys Archivist.  Patient Care Team: Kurt Pretty, FNP as PCP - General (Nurse Practitioner) Doran Stabler, MD as Consulting Physician (Gastroenterology)  Advanced Directives 09/29/2019 09/25/2018 02/25/2018  Does Patient Have a Medical Advance Directive? No No No  Would patient like information on creating a medical advance directive? No - Patient declined Yes (MAU/Ambulatory/Procedural Areas - Information given) No - Patient declined    Hospital Utilization Over the Past 12 Months: # of hospitalizations or ER visits: 0 # of surgeries: 0  Review of Systems    Patient reports that his overall health is worse compared to last year.  History obtained from the patient  Patient Reported Readings (BP, Pulse, CBG, Weight, etc) CBG 156  Pain Assessment Pain : No/denies pain     Current Medications & Allergies (verified) Allergies as of 09/29/2019      Reactions   Penicillins Anaphylaxis, Other (See Comments)   Whelps Has patient had a PCN  reaction causing immediate rash, facial/tongue/throat swelling, SOB or lightheadedness with hypotension: Yes Has patient had a PCN reaction causing severe rash involving mucus membranes or skin necrosis: Yes Has patient had a PCN reaction that required hospitalization: Yes Has patient had a PCN reaction occurring within the last 10 years: No If all of the above answers are "NO", then may proceed with Cephalosporin use.   Eggs Or Egg-derived Products Nausea Only, Other (See Comments)   Stomach cramps   Peanut-containing Drug Products Hives, Itching      Medication List       Accurate as of September 29, 2019  9:05 AM. If you have any questions, ask your nurse or doctor.        STOP taking these medications   Anoro Ellipta 62.5-25 MCG/INH Aepb Generic drug: umeclidinium-vilanterol   budesonide-formoterol 160-4.5 MCG/ACT inhaler Commonly known as: SYMBICORT   ketoconazole 2 % cream Commonly known as: NIZORAL     TAKE these medications   aspirin 81 MG tablet Take 81 mg by mouth daily.   atorvastatin 40 MG tablet Commonly known as: LIPITOR Take 1 tablet (40 mg total) by mouth daily.   fenofibrate 160 MG tablet Take 1 tablet (160 mg total) by mouth daily.   glipiZIDE 10 MG tablet Commonly known as: GLUCOTROL Take 1 tablet (10 mg total) by mouth 2 (two) times daily before a meal.   ipratropium-albuterol 0.5-2.5 (3) MG/3ML Soln Commonly known as: DUONEB USE 3 MLS IN NEBULIZER AND INHALE INTO LUNGS 3 TIMES DAILY AS NEEDED FOR SHORTNESS OF BREATH.   lisinopril-hydrochlorothiazide 20-25 MG tablet Commonly known as: ZESTORETIC Take 1 tablet by mouth daily.   metFORMIN 1000 MG  tablet Commonly known as: GLUCOPHAGE TAKE 1 TABLET TWICE DAILY WITH A MEAL.   OneTouch Delica Lancets 99991111 Misc Check blood sugar 1x per day and prn  E 11.9   OneTouch Verio test strip Generic drug: glucose blood CHECK BLOOD SUGAR ONCE DAILY AS DIRECTED.       History (reviewed): Past Medical  History:  Diagnosis Date  . COPD (chronic obstructive pulmonary disease) ([place])   . Hyperlipidemia   . Hyperlipidemia 11/26/2012  . Hypertension   . Neuropathy    patient reported   Past Surgical History:  Procedure Laterality Date  . BACK SURGERY  1990s  . COLONOSCOPY N/A 02/25/2018   Procedure: COLONOSCOPY;  Surgeon: Doran Stabler, MD;  Location: [facility];  Service: Gastroenterology;  Laterality: N/A;  . HERNIA REPAIR    . NECK SURGERY    . POLYPECTOMY  02/25/2018   Procedure: POLYPECTOMY;  Surgeon: Doran Stabler, MD;  Location: [facility];  Service: Gastroenterology;;  . TONSILLECTOMY     Family History  Problem Relation Age of Onset  . Diabetes Mother   . Hypertension Mother   . Hyperlipidemia Mother   . Arthritis Mother   . Osteoporosis Mother   . Diabetes Father   . Hypertension Father   . Hyperlipidemia Father   . Heart attack Father 39       Late 59s and 60s  . Cancer Paternal Grandmother        colon  . Healthy Sister   . Healthy Brother   . Healthy Daughter   . Healthy Son   . Healthy Son   . Healthy Son    Social History   Socioeconomic History  . Marital status: Married    Spouse name: Not on file  . Number of children: 4  . Years of education: Not on file  . Highest education level: 11th grade  Occupational History  . Occupation: Disabled    Comment: Back problems  Tobacco Use  . Smoking status: Former Smoker    Packs/day: 1.00    Years: 10.00    Pack years: 10.00    Types: Cigarettes    Quit date: 11/26/2008    Years since quitting: 10.8  . Smokeless tobacco: Never Used  Substance and Sexual Activity  . Alcohol use: No  . Drug use: No  . Sexual activity: Not on file  Other Topics Concern  . Not on file  Social History Narrative  . Not on file   Social Determinants of Health   Financial Resource Strain:   . Difficulty of Paying Living Expenses: Not on file  Food Insecurity:   . Worried About Charity fundraiser in the Last  Year: Not on file  . Ran Out of Food in the Last Year: Not on file  Transportation Needs:   . Lack of Transportation (Medical): Not on file  . Lack of Transportation (Non-Medical): Not on file  Physical Activity:   . Days of Exercise per Week: Not on file  . Minutes of Exercise per Session: Not on file  Stress:   . Feeling of Stress : Not on file  Social Connections: Slightly Isolated  . Frequency of Communication with Friends and Family: More than three times a week  . Frequency of Social Gatherings with Friends and Family: More than three times a week  . Attends Religious Services: 1 to 4 times per year  . Active Member of Clubs or Organizations: No  . Attends Club or  Organization Meetings: Never  . Marital Status: Married    Activities of Daily Living In your present state of health, do you have any difficulty performing the following activities: 09/29/2019  Hearing? Y  Comment hard, can not afford hearing aids  Vision? N  Difficulty concentrating or making decisions? N  Walking or climbing stairs? Y  Comment knees and hips prevent  Dressing or bathing? N  Doing errands, shopping? N  Preparing Food and eating ? N  Using the Toilet? N  In the past six months, have you accidently leaked urine? N  Do you have problems with loss of bowel control? N  Managing your Medications? N  Managing your Finances? N  Housekeeping or managing your Housekeeping? N  Some recent data might be hidden    Patient Education/ Literacy How often do you need to have someone help you when you read instructions, pamphlets, or other written materials from your doctor or pharmacy?: 1 - Never What is the last grade level you completed in school?: 11  Exercise Current Exercise Habits: The patient does not participate in regular exercise at present, Exercise limited by: orthopedic condition(s)  Diet Patient reports consuming 2 meals a day and 1 snack(s) a day Patient reports that his primary diet is:  Diabetic Patient reports that she does have regular access to food.   Depression Screen PHQ 2/9 Scores 09/29/2019 07/30/2019 07/02/2019 12/30/2018 09/25/2018 09/25/2018 07/04/2018  PHQ - 2 Score 0 0 0 0 0 0 0     Fall Risk Fall Risk  07/30/2019 07/02/2019 09/25/2018 09/25/2018 07/04/2018  Falls in the past year? 1 0 0 0 0  Comment - - - - -  Number falls in past yr: 0 - - - -  Injury with Fall? 1 - - - -  Comment Knee pain - - - -  Follow up - - - - -     Objective:  Kurt Blevins seemed alert and oriented and he participated appropriately during our telephone visit.  Blood Pressure Weight BMI  BP Readings from Last 3 Encounters:  07/30/19 132/79  07/02/19 (!) 142/86  09/25/18 (!) 140/92   Wt Readings from Last 3 Encounters:  07/30/19 (!) 315 lb (142.9 kg)  07/02/19 (!) 336 lb (152.4 kg)  09/25/18 (!) 326 lb (147.9 kg)   BMI Readings from Last 1 Encounters:  07/30/19 43.93 kg/m    *Unable to obtain current vital signs, weight, and BMI due to telephone visit type  Hearing/Vision  . [place] did not seem to have difficulty with hearing/understanding during the telephone conversation . Reports that he has had a formal eye exam by an eye care professional within the past year . Reports that he has had a formal hearing evaluation within the past year *Unable to fully assess hearing and vision during telephone visit type  Cognitive Function: 6CIT Screen 09/29/2019 09/29/2019  What Year? 0 points 0 points  What month? 0 points 0 points  What time? 0 points 0 points  Count back from 20 0 points -  Months in reverse 0 points -  Repeat phrase 2 points -  Total Score 2 -   (Normal:0-7, Significant for Dysfunction: >8)  Normal Cognitive Function Screening: Yes   Immunization & Health Maintenance Record Immunization History  Administered Date(s) Administered  . Tdap 02/01/2014    Health Maintenance  Topic Date Due  . PNEUMOCOCCAL POLYSACCHARIDE VACCINE AGE 75-64 HIGH RISK  10/17/1963    . HIV Screening  10/16/1976  .  HEMOGLOBIN A1C  01/27/2020  . OPHTHALMOLOGY EXAM  04/09/2020  . FOOT EXAM  07/01/2020  . TETANUS/TDAP  02/02/2024  . COLONOSCOPY  02/26/2028  . Hepatitis C Screening  Completed       Assessment  This is a routine wellness examination for [employer].  Health Maintenance: Due or Overdue Health Maintenance Due  Topic Date Due  . PNEUMOCOCCAL POLYSACCHARIDE VACCINE AGE 57-64 HIGH RISK  10/17/1963  . HIV Screening  10/16/1976    Kurt Blevins does not need a referral for Community Assistance: Care Management:   no Social Work:    no Prescription Assistance:  no Nutrition/Diabetes Education:  no   Plan:  Personalized Goals Goals Addressed            This Visit's Progress   . Weight (lb) < 285 lb (129.3 kg) (pt-stated)       He has cut back on carbs and sugar. He has lost 35 pounds in last 6 months.    . COMPLETED: Weight (lb) < 300 lb (136.1 kg) (pt-stated)       Continue to make healthier food choices and increase walking to at least 45 minutes per day.       Personalized Health Maintenance & Screening Recommendations  Pneumococcal vaccine   Lung Cancer Screening Recommended: no (Low Dose CT Chest recommended if Age 71-80 years, 30 pack-year currently smoking OR have quit w/in past 15 years) Hepatitis C Screening recommended: no HIV Screening recommended: yes  Advanced Directives: Written information was not prepared per patient's request.  Referrals & Orders No orders of the defined types were placed in this encounter.   Follow-up Plan . Follow-up with Kurt Pretty, FNP as planned in April, 2021 Pt sounds good over the telephone, voices no concerns. Pt will receive Pneumovax at next appt Pt needs HIV screen Pt has no issues with sight, had diabetic eye exam in 03/2019. Denies assistance with Advanced Directives.   I have personally reviewed and noted the following in the patient's chart:   . Medical and social  history . Use of alcohol, tobacco or illicit drugs  . Current medications and supplements . Functional ability and status . Nutritional status . Physical activity . Advanced directives . List of other physicians . Hospitalizations, surgeries, and ER visits in previous 12 months . Vitals . Screenings to include cognitive, depression, and falls . Referrals and appointments  In addition, I have reviewed and discussed with Kurt Blevins certain preventive protocols, quality metrics, and best practice recommendations. A written personalized care plan for preventive services as well as general preventive health recommendations is available and can be mailed to the patient at his request.      Rana Snare, LPN  [id number]

## 2019-10-02 ENCOUNTER — Ambulatory Visit: Payer: Self-pay | Admitting: Nurse Practitioner

## 2019-10-29 ENCOUNTER — Encounter: Payer: Self-pay | Admitting: Nurse Practitioner

## 2019-10-29 ENCOUNTER — Ambulatory Visit (INDEPENDENT_AMBULATORY_CARE_PROVIDER_SITE_OTHER): Payer: Medicare Other | Admitting: Nurse Practitioner

## 2019-10-29 DIAGNOSIS — E1142 Type 2 diabetes mellitus with diabetic polyneuropathy: Secondary | ICD-10-CM | POA: Diagnosis not present

## 2019-10-29 DIAGNOSIS — E782 Mixed hyperlipidemia: Secondary | ICD-10-CM | POA: Diagnosis not present

## 2019-10-29 DIAGNOSIS — J449 Chronic obstructive pulmonary disease, unspecified: Secondary | ICD-10-CM | POA: Diagnosis not present

## 2019-10-29 DIAGNOSIS — I10 Essential (primary) hypertension: Secondary | ICD-10-CM

## 2019-10-29 DIAGNOSIS — E1140 Type 2 diabetes mellitus with diabetic neuropathy, unspecified: Secondary | ICD-10-CM | POA: Diagnosis not present

## 2019-10-29 MED ORDER — FENOFIBRATE 160 MG PO TABS: 160.0000 mg | ORAL_TABLET | Freq: Every day | ORAL | 1 refills | Status: DC

## 2019-10-29 MED ORDER — GLIPIZIDE 10 MG PO TABS: 10.0000 mg | ORAL_TABLET | Freq: Two times a day (BID) | ORAL | 1 refills | Status: DC

## 2019-10-29 MED ORDER — LISINOPRIL-HYDROCHLOROTHIAZIDE 20-25 MG PO TABS: 1.0000 | ORAL_TABLET | Freq: Every day | ORAL | 1 refills | Status: DC

## 2019-10-29 MED ORDER — ATORVASTATIN CALCIUM 40 MG PO TABS: 40.0000 mg | ORAL_TABLET | Freq: Every day | ORAL | 1 refills | Status: DC

## 2019-10-29 MED ORDER — METFORMIN HCL 1000 MG PO TABS: ORAL_TABLET | ORAL | 1 refills | Status: DC

## 2019-10-29 NOTE — Progress Notes (Signed)
Virtual Visit via telephone Note Due to COVID-19 pandemic this visit was conducted virtually. This visit type was conducted due to national recommendations for restrictions regarding the COVID-19 Pandemic (e.g. social distancing, sheltering in place) in an effort to limit this patient's exposure and mitigate transmission in our community. All issues noted in this document were discussed and addressed.  A physical exam was not performed with this format.  I connected with Kurt Blevins on 10/29/19 at 1:40 by telephone and verified that I am speaking with the correct person using two identifiers. Kurt Blevins is currently located at home and no one is currently with her during visit. The provider, Mary-Margaret Hassell Done, FNP is located in their office at time of visit.  I discussed the limitations, risks, security and privacy concerns of performing an evaluation and management service by telephone and the availability of in person appointments. I also discussed with the patient that there may be a patient responsible charge related to this service. The patient expressed understanding and agreed to proceed.   History and Present Illness:   Chief Complaint: Medical Management of Chronic Issues    HPI:  1. Essential hypertension No c/o chest pain, sob or headache. Does not check blood pressure at home. BP Readings from Last 3 Encounters:  07/30/19 132/79  07/02/19 (!) 142/86  09/25/18 (!) 140/92     2. Mixed hyperlipidemia Does not watch diet and does very little exercise Lab Results  Component Value Date   CHOL 122 07/02/2019   HDL 28 (L) 07/02/2019   LDLCALC 40 07/02/2019   TRIG 368 (H) 07/02/2019   CHOLHDL 4.4 07/02/2019     3. Type 2 diabetes mellitus with diabetic polyneuropathy, without long-term current use of insulin ([place]) Checks blood sugars most days but not every day. Blood sugars have been below 140 Lab Results  Component Value Date   HGBA1C 7.6 (H) 07/30/2019     4. Neuropathy due to type 2 diabetes mellitus ([facility]) Has mainly numbness in bil feet. Occasional burning sensation.  5. COPD with asthma ([place]) Has severe COPD and stays SOB with activity. Currenlty only using duoneb. Has not seen pulmonology in many years. Says he cannot afford any maintenance inhalers.  6. Severe obesity (BMI >= 40) ([facility]) Weight is down to 300lbs. His wife has had him really watching his diet. Wt Readings from Last 3 Encounters:  07/30/19 (!) 315 lb (142.9 kg)  07/02/19 (!) 336 lb (152.4 kg)  09/25/18 (!) 326 lb (147.9 kg)   BMI Readings from Last 3 Encounters:  07/30/19 43.93 kg/m  07/02/19 46.86 kg/m  09/25/18 45.47 kg/m       Outpatient Encounter Medications as of 10/29/2019  Medication Sig  . aspirin 81 MG tablet Take 81 mg by mouth daily.  Marland Kitchen atorvastatin (LIPITOR) 40 MG tablet Take 1 tablet (40 mg total) by mouth daily.  . fenofibrate 160 MG tablet Take 1 tablet (160 mg total) by mouth daily.  Marland Kitchen glipiZIDE (GLUCOTROL) 10 MG tablet Take 1 tablet (10 mg total) by mouth 2 (two) times daily before a meal.  . ipratropium-albuterol (DUONEB) 0.5-2.5 (3) MG/3ML SOLN USE 3 MLS IN NEBULIZER AND INHALE INTO LUNGS 3 TIMES DAILY AS NEEDED FOR SHORTNESS OF BREATH.  Marland Kitchen lisinopril-hydrochlorothiazide (ZESTORETIC) 20-25 MG tablet Take 1 tablet by mouth daily.  . metFORMIN (GLUCOPHAGE) 1000 MG tablet TAKE 1 TABLET TWICE DAILY WITH A MEAL.  Marland Kitchen ONETOUCH DELICA LANCETS 99991111 MISC Check blood sugar 1x per day and prn  E 11.9  . ONETOUCH VERIO test strip CHECK BLOOD SUGAR ONCE DAILY AS DIRECTED.     Past Surgical History:  Procedure Laterality Date  . BACK SURGERY  1990s  . COLONOSCOPY N/A 02/25/2018   Procedure: COLONOSCOPY;  Surgeon: Doran Stabler, MD;  Location: [facility];  Service: Gastroenterology;  Laterality: N/A;  . HERNIA REPAIR    . NECK SURGERY    . POLYPECTOMY  02/25/2018   Procedure: POLYPECTOMY;  Surgeon: Doran Stabler, MD;  Location: [facility];   Service: Gastroenterology;;  . TONSILLECTOMY      Family History  Problem Relation Age of Onset  . Diabetes Mother   . Hypertension Mother   . Hyperlipidemia Mother   . Arthritis Mother   . Osteoporosis Mother   . Diabetes Father   . Hypertension Father   . Hyperlipidemia Father   . Heart attack Father 48       Late 69s and 93s  . Cancer Paternal Grandmother        colon  . Healthy Sister   . Healthy Brother   . Healthy Daughter   . Healthy Son   . Healthy Son   . Healthy Son     New complaints: None today  Social history: Lives with his wife.   Controlled substance contract: n/a    Review of Systems  Constitutional: Negative for diaphoresis and weight loss.  Eyes: Negative for blurred vision, double vision and pain.  Respiratory: Positive for cough.   Cardiovascular: Positive for leg swelling. Negative for chest pain, palpitations and orthopnea.  Gastrointestinal: Negative for abdominal pain.  Skin: Negative for rash.  Neurological: Negative for dizziness, sensory change, loss of consciousness, weakness and headaches.  Endo/Heme/Allergies: Negative for polydipsia. Does not bruise/bleed easily.  Psychiatric/Behavioral: Negative for memory loss. The patient does not have insomnia.   All other systems reviewed and are negative.    Observations/Objective: Alert and oriented- answers all questions appropriately No distress Slight cough Voice hoarse  Assessment and Plan: Kurt Blevins comes in today with chief complaint of Medical Management of Chronic Issues   Diagnosis and orders addressed:  1. Essential hypertension Low sodium diet - lisinopril-hydrochlorothiazide (ZESTORETIC) 20-25 MG tablet; Take 1 tablet by mouth daily.  Dispense: 90 tablet; Refill: 1  2. Mixed hyperlipidemia Low fat diet - atorvastatin (LIPITOR) 40 MG tablet; Take 1 tablet (40 mg total) by mouth daily.  Dispense: 90 tablet; Refill: 1 - fenofibrate 160 MG tablet; Take 1 tablet  (160 mg total) by mouth daily.  Dispense: 90 tablet; Refill: 1  3. Type 2 diabetes mellitus with diabetic polyneuropathy, without long-term current use of insulin (HCC) Continue ti watch carbs in diet - glipiZIDE (GLUCOTROL) 10 MG tablet; Take 1 tablet (10 mg total) by mouth 2 (two) times daily before a meal.  Dispense: 180 tablet; Refill: 1 - metFORMIN (GLUCOPHAGE) 1000 MG tablet; TAKE 1 TABLET TWICE DAILY WITH A MEAL.  Dispense: 180 tablet; Refill: 1  4. Neuropathy due to type 2 diabetes mellitus ([place]) Do not go barefooted  5. COPD with asthma ([place]) Continue duoneb Really need to consider covid vaccine- would prefer he get pfizer vaccine  6. Severe obesity (BMI >= 40) (HCC) Continue current diet Exercise when can   Labs pending Health Maintenance reviewed Diet and exercise encouraged  Follow Up Instructions: 3 months    I discussed the assessment and treatment plan with the patient. The patient was provided an opportunity to ask questions and all  were answered. The patient agreed with the plan and demonstrated an understanding of the instructions.   The patient was advised to call back or seek an in-person evaluation if the symptoms worsen or if the condition fails to improve as anticipated.  The above assessment and management plan was discussed with the patient. The patient verbalized understanding of and has agreed to the management plan. Patient is aware to call the clinic if symptoms persist or worsen. Patient is aware when to return to the clinic for a follow-up visit. Patient educated on when it is appropriate to go to the emergency department.   Time call ended:  1:55  I provided 15 minutes of non-face-to-face time during this encounter.    Mary-Margaret Hassell Done, FNP

## 2020-01-28 ENCOUNTER — Encounter: Payer: Self-pay | Admitting: Nurse Practitioner

## 2020-01-28 ENCOUNTER — Other Ambulatory Visit: Payer: Self-pay

## 2020-01-28 ENCOUNTER — Ambulatory Visit (INDEPENDENT_AMBULATORY_CARE_PROVIDER_SITE_OTHER): Payer: Medicare Other | Admitting: Nurse Practitioner

## 2020-01-28 VITALS — BP 160/91 | HR 79 | Temp 98.3°F | Resp 20 | Ht 71.0 in | Wt 299.0 lb

## 2020-01-28 DIAGNOSIS — J449 Chronic obstructive pulmonary disease, unspecified: Secondary | ICD-10-CM

## 2020-01-28 DIAGNOSIS — L247 Irritant contact dermatitis due to plants, except food: Secondary | ICD-10-CM

## 2020-01-28 DIAGNOSIS — J4489 Other specified chronic obstructive pulmonary disease: Secondary | ICD-10-CM

## 2020-01-28 DIAGNOSIS — E1142 Type 2 diabetes mellitus with diabetic polyneuropathy: Secondary | ICD-10-CM | POA: Diagnosis not present

## 2020-01-28 DIAGNOSIS — E782 Mixed hyperlipidemia: Secondary | ICD-10-CM | POA: Diagnosis not present

## 2020-01-28 DIAGNOSIS — I10 Essential (primary) hypertension: Secondary | ICD-10-CM

## 2020-01-28 DIAGNOSIS — E1140 Type 2 diabetes mellitus with diabetic neuropathy, unspecified: Secondary | ICD-10-CM

## 2020-01-28 LAB — BAYER DCA HB A1C WAIVED: HB A1C (BAYER DCA - WAIVED): 7.1 % — ABNORMAL HIGH (ref <7.0)

## 2020-01-28 MED ORDER — METHYLPREDNISOLONE ACETATE 80 MG/ML IJ SUSP
80.0000 mg | Freq: Once | INTRAMUSCULAR | Status: AC
  Administered 2020-01-28: 80 mg via INTRAMUSCULAR

## 2020-01-28 MED ORDER — FENOFIBRATE 160 MG PO TABS: 160.0000 mg | ORAL_TABLET | Freq: Every day | ORAL | 1 refills | Status: DC

## 2020-01-28 MED ORDER — GLIPIZIDE 10 MG PO TABS: 10.0000 mg | ORAL_TABLET | Freq: Two times a day (BID) | ORAL | 1 refills | Status: DC

## 2020-01-28 MED ORDER — METFORMIN HCL 1000 MG PO TABS: ORAL_TABLET | ORAL | 1 refills | Status: DC

## 2020-01-28 MED ORDER — ATORVASTATIN CALCIUM 40 MG PO TABS: 40.0000 mg | ORAL_TABLET | Freq: Every day | ORAL | 1 refills | Status: DC

## 2020-01-28 MED ORDER — IPRATROPIUM-ALBUTEROL 0.5-2.5 (3) MG/3ML IN SOLN: RESPIRATORY_TRACT | 5 refills | Status: DC

## 2020-01-28 NOTE — Patient Instructions (Signed)
Diabetes Mellitus and Foot Care Foot care is an important part of your health, especially when you have diabetes. Diabetes may cause you to have problems because of poor blood flow (circulation) to your feet and legs, which can cause your skin to:  Become thinner and drier.  Break more easily.  Heal more slowly.  Peel and crack. You may also have nerve damage (neuropathy) in your legs and feet, causing decreased feeling in them. This means that you may not notice minor injuries to your feet that could lead to more serious problems. Noticing and addressing any potential problems early is the best way to prevent future foot problems. How to care for your feet Foot hygiene  Wash your feet daily with warm water and mild soap. Do not use hot water. Then, pat your feet and the areas between your toes until they are completely dry. Do not soak your feet as this can dry your skin.  Trim your toenails straight across. Do not dig under them or around the cuticle. File the edges of your nails with an emery board or nail file.  Apply a moisturizing lotion or petroleum jelly to the skin on your feet and to dry, brittle toenails. Use lotion that does not contain alcohol and is unscented. Do not apply lotion between your toes. Shoes and socks  Wear clean socks or stockings every day. Make sure they are not too tight. Do not wear knee-high stockings since they may decrease blood flow to your legs.  Wear shoes that fit properly and have enough cushioning. Always look in your shoes before you put them on to be sure there are no objects inside.  To break in new shoes, wear them for just a few hours a day. This prevents injuries on your feet. Wounds, scrapes, corns, and calluses  Check your feet daily for blisters, cuts, bruises, sores, and redness. If you cannot see the bottom of your feet, use a mirror or ask someone for help.  Do not cut corns or calluses or try to remove them with medicine.  If you  find a minor scrape, cut, or break in the skin on your feet, keep it and the skin around it clean and dry. You may clean these areas with mild soap and water. Do not clean the area with peroxide, alcohol, or iodine.  If you have a wound, scrape, corn, or callus on your foot, look at it several times a day to make sure it is healing and not infected. Check for: ? Redness, swelling, or pain. ? Fluid or blood. ? Warmth. ? Pus or a bad smell. General instructions  Do not cross your legs. This may decrease blood flow to your feet.  Do not use heating pads or hot water bottles on your feet. They may burn your skin. If you have lost feeling in your feet or legs, you may not know this is happening until it is too late.  Protect your feet from hot and cold by wearing shoes, such as at the beach or on hot pavement.  Schedule a complete foot exam at least once a year (annually) or more often if you have foot problems. If you have foot problems, report any cuts, sores, or bruises to your health care provider immediately. Contact a health care provider if:  You have a medical condition that increases your risk of infection and you have any cuts, sores, or bruises on your feet.  You have an injury that is not   healing.  You have redness on your legs or feet.  You feel burning or tingling in your legs or feet.  You have pain or cramps in your legs and feet.  Your legs or feet are numb.  Your feet always feel cold.  You have pain around a toenail. Get help right away if:  You have a wound, scrape, corn, or callus on your foot and: ? You have pain, swelling, or redness that gets worse. ? You have fluid or blood coming from the wound, scrape, corn, or callus. ? Your wound, scrape, corn, or callus feels warm to the touch. ? You have pus or a bad smell coming from the wound, scrape, corn, or callus. ? You have a fever. ? You have a red line going up your leg. Summary  Check your feet every day  for cuts, sores, red spots, swelling, and blisters.  Moisturize feet and legs daily.  Wear shoes that fit properly and have enough cushioning.  If you have foot problems, report any cuts, sores, or bruises to your health care provider immediately.  Schedule a complete foot exam at least once a year (annually) or more often if you have foot problems. This information is not intended to replace advice given to you by your health care provider. Make sure you discuss any questions you have with your health care provider. Document Revised: 04/01/2019 Document Reviewed: 08/10/2016 Elsevier Patient Education  2020 Elsevier Inc.  

## 2020-01-28 NOTE — Progress Notes (Addendum)
Subjective:    Patient ID: Kurt Blevins, male    DOB: [birth year], 59 y.o.   MRN: [id number]   Chief Complaint: Medical Management of Chronic Issues    HPI:  1. Essential hypertension Reports checking his BP at home and it remains unremarkable. Also reports restricting sodium intake.  BP Readings from Last 3 Encounters:  07/30/19 132/79  07/02/19 (!) 142/86  09/25/18 (!) 140/92     2. Mixed hyperlipidemia Reports watching his fat intake. Does walk for exercise, 1/4 a mile per day. Lab Results  Component Value Date   CHOL 122 07/02/2019   HDL 28 (L) 07/02/2019   LDLCALC 40 07/02/2019   TRIG 368 (H) 07/02/2019   CHOLHDL 4.4 07/02/2019     3. Type 2 diabetes mellitus with diabetic polyneuropathy, without long-term current use of insulin ([place]) Reports a carb-modified diet. Checks CBG fasting daily, 111 this morning.  Lab Results  Component Value Date   HGBA1C 7.6 (H) 07/30/2019     4. COPD with asthma ([place]) Reports shortness of breath with increased heat, otherwise stable.   5. Severe obesity (BMI >= 40) ([facility]) Reports making efforts to lose weight. Weighs himself regularly. Complies with a fat-restricted diet. Weight down 21lbs since last visit BMI Readings from Last 3 Encounters:  07/30/19 43.93 kg/m  07/02/19 46.86 kg/m  09/25/18 45.47 kg/m   Wt Readings from Last 3 Encounters:  07/30/19 (!) 315 lb (142.9 kg)  07/02/19 (!) 336 lb (152.4 kg)  09/25/18 (!) 326 lb (147.9 kg)     6. Neuropathy due to type 2 diabetes mellitus ([place]) Reports having feeling in his feel, but does experience nerve pain.     Outpatient Encounter Medications as of 01/28/2020  Medication Sig  . aspirin 81 MG tablet Take 81 mg by mouth daily.  Marland Kitchen atorvastatin (LIPITOR) 40 MG tablet Take 1 tablet (40 mg total) by mouth daily.  . fenofibrate 160 MG tablet Take 1 tablet (160 mg total) by mouth daily.  Marland Kitchen glipiZIDE (GLUCOTROL) 10 MG tablet Take 1 tablet (10 mg total) by mouth 2 (two)  times daily before a meal.  . ipratropium-albuterol (DUONEB) 0.5-2.5 (3) MG/3ML SOLN USE 3 MLS IN NEBULIZER AND INHALE INTO LUNGS 3 TIMES DAILY AS NEEDED FOR SHORTNESS OF BREATH.  Marland Kitchen lisinopril-hydrochlorothiazide (ZESTORETIC) 20-25 MG tablet Take 1 tablet by mouth daily.  . metFORMIN (GLUCOPHAGE) 1000 MG tablet TAKE 1 TABLET TWICE DAILY WITH A MEAL.  Marland Kitchen ONETOUCH DELICA LANCETS 76E MISC Check blood sugar 1x per day and prn  E 11.9  . ONETOUCH VERIO test strip CHECK BLOOD SUGAR ONCE DAILY AS DIRECTED.   No facility-administered encounter medications on file as of 01/28/2020.    Past Surgical History:  Procedure Laterality Date  . BACK SURGERY  1990s  . COLONOSCOPY N/A 02/25/2018   Procedure: COLONOSCOPY;  Surgeon: Doran Stabler, MD;  Location: [facility];  Service: Gastroenterology;  Laterality: N/A;  . HERNIA REPAIR    . NECK SURGERY    . POLYPECTOMY  02/25/2018   Procedure: POLYPECTOMY;  Surgeon: Doran Stabler, MD;  Location: [facility];  Service: Gastroenterology;;  . TONSILLECTOMY      Family History  Problem Relation Age of Onset  . Diabetes Mother   . Hypertension Mother   . Hyperlipidemia Mother   . Arthritis Mother   . Osteoporosis Mother   . Diabetes Father   . Hypertension Father   . Hyperlipidemia Father   . Heart attack Father  40       Late 55s and 60s  . Cancer Paternal Grandmother        colon  . Healthy Sister   . Healthy Brother   . Healthy Daughter   . Healthy Son   . Healthy Son   . Healthy Son     New complaints: Reports having poison-oak exposure with a rash on his bilateral forearms and legs. Is requesting a steroid shot today.   Social history: Lives at home with his wife. Does not work at this time, is disabled.   Controlled substance contract: n/a     Review of Systems  Constitutional: Negative.   HENT: Negative.   Eyes: Negative.   Respiratory: Negative.   Cardiovascular: Negative.   Gastrointestinal: Negative.   Endocrine:  Negative.   Genitourinary: Negative.   Musculoskeletal: Negative.   Skin: Negative.   Allergic/Immunologic: Negative.   Neurological: Negative.   Hematological: Negative.   Psychiatric/Behavioral: Negative.        Objective:   Physical Exam Vitals and nursing note reviewed.  Constitutional:      Appearance: Normal appearance. He is obese.  HENT:     Head: Normocephalic and atraumatic.     Right Ear: Tympanic membrane, ear canal and external ear normal.     Left Ear: Tympanic membrane, ear canal and external ear normal.     Nose: Nose normal.     Mouth/Throat:     Mouth: Mucous membranes are moist.     Pharynx: Oropharynx is clear.  Eyes:     Extraocular Movements: Extraocular movements intact.     Conjunctiva/sclera: Conjunctivae normal.     Pupils: Pupils are equal, round, and reactive to light.  Cardiovascular:     Rate and Rhythm: Normal rate and regular rhythm.     Pulses: Normal pulses.     Heart sounds: Normal heart sounds.  Pulmonary:     Effort: Pulmonary effort is normal.     Breath sounds: Wheezing present.     Comments: Wheezing noted in the upper lobes bilaterally and lower right lobe. Diminished in the lower left lobe.  Abdominal:     General: Bowel sounds are normal.     Palpations: Abdomen is soft.     Tenderness: There is abdominal tenderness.     Comments: Abdominal tenderness noted in the upper quadrants. Abdominal obesity noted.   Genitourinary:    Penis: Normal.      Prostate: Normal.     Rectum: Normal.  Musculoskeletal:        General: Normal range of motion.     Cervical back: Normal range of motion and neck supple.  Skin:    General: Skin is warm and dry.     Capillary Refill: Capillary refill takes less than 2 seconds.     Comments: Erythematous maculopapular lesions in linear pattern on bil forearms.  Neurological:     General: No focal deficit present.     Mental Status: He is alert and oriented to person, place, and time. Mental status  is at baseline.  Psychiatric:        Mood and Affect: Mood normal.        Behavior: Behavior normal.        Thought Content: Thought content normal.        Judgment: Judgment normal.     BP (!) 160/91   Pulse 79   Temp 98.3 F (36.8 C) (Temporal)   Resp 20   Ht _0  (  1.803 m)   Wt 299 lb (135.6 kg)   SpO2 94%   BMI 41.70 kg/m   hgba1c 7.1%    Assessment & Plan:  Kurt Blevins comes in today with chief complaint of Medical Management of Chronic Issues   Diagnosis and orders addressed:  1. Essential hypertension Patient encouraged to follow a sodium-reduced diet and check BP regularly at home.  - CBC with Differential/Platelet - CMP14+EGFR  2. Mixed hyperlipidemia Patient encouraged to follow a fat-restricted diet and exercise as tolerated.  - Lipid panel  3. Type 2 diabetes mellitus with diabetic polyneuropathy, without long-term current use of insulin ([place]) Patient encouraged to follow a carb-modified diet, check fasting CBGs daily and log them, and comply with medication regimen.  - Bayer DCA Hb A1c Waived - Microalbumin / creatinine urine ratio  4. COPD with asthma [facility]) Patient encouraged to maintain regular pulmonology follow-ups and maintain current treatment regimen. Avoid vigorous activity and asthma triggers.   5. Severe obesity (BMI >= 40) ([facility]) Patient encouraged to weigh themselves regularly, comply with a fat-reduced diet, and exercise as tolerated.   6. Neuropathy due to type 2 diabetes mellitus [facility]) Patient encouraged to comply with diabetic treatment regimen and perform foot care to prevent ulcers and injuries.   7. Contact dermatitis depomedrol 65m IM Watch blood sugars for the next few days    Labs pending Health Maintenance reviewed Diet and exercise encouraged  Follow up plan: Follow-up in 3 months    Mary-Margaret MHassell Done FRyan FMulinoStudent

## 2020-01-29 LAB — CBC WITH DIFFERENTIAL/PLATELET
Basophils Absolute: 0.1 10*3/uL (ref 0.0–0.2)
Basos: 1 %
EOS (ABSOLUTE): 0.4 10*3/uL (ref 0.0–0.4)
Eos: 4 %
Hematocrit: 42.6 % (ref 37.5–51.0)
Hemoglobin: 14.6 g/dL (ref 13.0–17.7)
Immature Grans (Abs): 0.1 10*3/uL (ref 0.0–0.1)
Immature Granulocytes: 1 %
Lymphocytes Absolute: 4.1 10*3/uL — ABNORMAL HIGH (ref 0.7–3.1)
Lymphs: 39 %
MCH: 28.9 pg (ref 26.6–33.0)
MCHC: 34.3 g/dL (ref 31.5–35.7)
MCV: 84 fL (ref 79–97)
Monocytes Absolute: 0.9 10*3/uL (ref 0.1–0.9)
Monocytes: 8 %
Neutrophils Absolute: 5.1 10*3/uL (ref 1.4–7.0)
Neutrophils: 47 %
Platelets: 285 10*3/uL (ref 150–450)
RBC: 5.05 x10E6/uL (ref 4.14–5.80)
RDW: 13 % (ref 11.6–15.4)
WBC: 10.6 10*3/uL (ref 3.4–10.8)

## 2020-01-29 LAB — CMP14+EGFR
ALT: 25 IU/L (ref 0–44)
AST: 26 IU/L (ref 0–40)
Albumin/Globulin Ratio: 2.1 (ref 1.2–2.2)
Albumin: 4.6 g/dL (ref 3.8–4.9)
Alkaline Phosphatase: 61 IU/L (ref 48–121)
BUN/Creatinine Ratio: 15 (ref 9–20)
BUN: 18 mg/dL (ref 6–24)
Bilirubin Total: 0.4 mg/dL (ref 0.0–1.2)
CO2: 25 mmol/L (ref 20–29)
Calcium: 9.9 mg/dL (ref 8.7–10.2)
Chloride: 102 mmol/L (ref 96–106)
Creatinine, Ser: 1.19 mg/dL (ref 0.76–1.27)
GFR calc Af Amer: 77 mL/min/{1.73_m2} (ref >59)
GFR calc non Af Amer: 67 mL/min/{1.73_m2} (ref >59)
Globulin, Total: 2.2 g/dL (ref 1.5–4.5)
Glucose: 48 mg/dL — ABNORMAL LOW (ref 65–99)
Potassium: 4.2 mmol/L (ref 3.5–5.2)
Sodium: 143 mmol/L (ref 134–144)
Total Protein: 6.8 g/dL (ref 6.0–8.5)

## 2020-01-29 LAB — LIPID PANEL
Chol/HDL Ratio: 3.5 ratio (ref 0.0–5.0)
Cholesterol, Total: 130 mg/dL (ref 100–199)
HDL: 37 mg/dL — ABNORMAL LOW (ref >39)
LDL Chol Calc (NIH): 61 mg/dL (ref 0–99)
Triglycerides: 195 mg/dL — ABNORMAL HIGH (ref 0–149)
VLDL Cholesterol Cal: 32 mg/dL (ref 5–40)

## 2020-05-30 ENCOUNTER — Encounter: Payer: Self-pay | Admitting: Nurse Practitioner

## 2020-05-30 ENCOUNTER — Ambulatory Visit (INDEPENDENT_AMBULATORY_CARE_PROVIDER_SITE_OTHER): Payer: Medicare Other | Admitting: Nurse Practitioner

## 2020-05-30 ENCOUNTER — Other Ambulatory Visit: Payer: Self-pay

## 2020-05-30 VITALS — BP 121/83 | HR 78 | Temp 97.8°F | Resp 20 | Ht 71.0 in | Wt 306.0 lb

## 2020-05-30 DIAGNOSIS — E1142 Type 2 diabetes mellitus with diabetic polyneuropathy: Secondary | ICD-10-CM | POA: Diagnosis not present

## 2020-05-30 DIAGNOSIS — J449 Chronic obstructive pulmonary disease, unspecified: Secondary | ICD-10-CM

## 2020-05-30 DIAGNOSIS — I10 Essential (primary) hypertension: Secondary | ICD-10-CM | POA: Diagnosis not present

## 2020-05-30 DIAGNOSIS — J4489 Other specified chronic obstructive pulmonary disease: Secondary | ICD-10-CM

## 2020-05-30 DIAGNOSIS — E782 Mixed hyperlipidemia: Secondary | ICD-10-CM

## 2020-05-30 DIAGNOSIS — Z125 Encounter for screening for malignant neoplasm of prostate: Secondary | ICD-10-CM

## 2020-05-30 LAB — BAYER DCA HB A1C WAIVED: HB A1C (BAYER DCA - WAIVED): 7.4 % — ABNORMAL HIGH (ref <7.0)

## 2020-05-30 MED ORDER — IPRATROPIUM-ALBUTEROL 0.5-2.5 (3) MG/3ML IN SOLN: RESPIRATORY_TRACT | 5 refills | Status: DC

## 2020-05-30 MED ORDER — GLIPIZIDE 10 MG PO TABS: 10.0000 mg | ORAL_TABLET | Freq: Two times a day (BID) | ORAL | 1 refills | Status: DC

## 2020-05-30 MED ORDER — METFORMIN HCL 1000 MG PO TABS: ORAL_TABLET | ORAL | 1 refills | Status: DC

## 2020-05-30 MED ORDER — FENOFIBRATE 160 MG PO TABS: 160.0000 mg | ORAL_TABLET | Freq: Every day | ORAL | 1 refills | Status: DC

## 2020-05-30 MED ORDER — LISINOPRIL-HYDROCHLOROTHIAZIDE 20-25 MG PO TABS: 1.0000 | ORAL_TABLET | Freq: Every day | ORAL | 1 refills | Status: DC

## 2020-05-30 MED ORDER — ATORVASTATIN CALCIUM 40 MG PO TABS: 40.0000 mg | ORAL_TABLET | Freq: Every day | ORAL | 1 refills | Status: DC

## 2020-05-30 NOTE — Patient Instructions (Signed)
DASH Eating Plan DASH stands for "Dietary Approaches to Stop Hypertension." The DASH eating plan is a healthy eating plan that has been shown to reduce high blood pressure (hypertension). It may also reduce your risk for type 2 diabetes, heart disease, and stroke. The DASH eating plan may also help with weight loss. What are tips for following this plan?  General guidelines  Avoid eating more than 2,300 mg (milligrams) of salt (sodium) a day. If you have hypertension, you may need to reduce your sodium intake to 1,500 mg a day.  Limit alcohol intake to no more than 1 drink a day for nonpregnant women and 2 drinks a day for men. One drink equals 12 oz of beer, 5 oz of wine, or 1 oz of hard liquor.  Work with your health care provider to maintain a healthy body weight or to lose weight. Ask what an ideal weight is for you.  Get at least 30 minutes of exercise that causes your heart to beat faster (aerobic exercise) most days of the week. Activities may include walking, swimming, or biking.  Work with your health care provider or diet and nutrition specialist (dietitian) to adjust your eating plan to your individual calorie needs. Reading food labels   Check food labels for the amount of sodium per serving. Choose foods with less than 5 percent of the Daily Value of sodium. Generally, foods with less than 300 mg of sodium per serving fit into this eating plan.  To find whole grains, look for the word "whole" as the first word in the ingredient list. Shopping  Buy products labeled as "low-sodium" or "no salt added."  Buy fresh foods. Avoid canned foods and premade or frozen meals. Cooking  Avoid adding salt when cooking. Use salt-free seasonings or herbs instead of table salt or sea salt. Check with your health care provider or pharmacist before using salt substitutes.  Do not fry foods. Cook foods using healthy methods such as baking, boiling, grilling, and broiling instead.  Cook with  heart-healthy oils, such as olive, canola, soybean, or sunflower oil. Meal planning  Eat a balanced diet that includes: ? 5 or more servings of fruits and vegetables each day. At each meal, try to fill half of your plate with fruits and vegetables. ? Up to 6-8 servings of whole grains each day. ? Less than 6 oz of lean meat, poultry, or fish each day. A 3-oz serving of meat is about the same size as a deck of cards. One egg equals 1 oz. ? 2 servings of low-fat dairy each day. ? A serving of nuts, seeds, or beans 5 times each week. ? Heart-healthy fats. Healthy fats called Omega-3 fatty acids are found in foods such as flaxseeds and coldwater fish, like sardines, salmon, and mackerel.  Limit how much you eat of the following: ? Canned or prepackaged foods. ? Food that is high in trans fat, such as fried foods. ? Food that is high in saturated fat, such as fatty meat. ? Sweets, desserts, sugary drinks, and other foods with added sugar. ? Full-fat dairy products.  Do not salt foods before eating.  Try to eat at least 2 vegetarian meals each week.  Eat more home-cooked food and less restaurant, buffet, and fast food.  When eating at a restaurant, ask that your food be prepared with less salt or no salt, if possible. What foods are recommended? The items listed may not be a complete list. Talk with your dietitian about   what dietary choices are best for you. Grains Whole-grain or whole-wheat bread. Whole-grain or whole-wheat pasta. Brown rice. Oatmeal. Quinoa. Bulgur. Whole-grain and low-sodium cereals. Pita bread. Low-fat, low-sodium crackers. Whole-wheat flour tortillas. Vegetables Fresh or frozen vegetables (raw, steamed, roasted, or grilled). Low-sodium or reduced-sodium tomato and vegetable juice. Low-sodium or reduced-sodium tomato sauce and tomato paste. Low-sodium or reduced-sodium canned vegetables. Fruits All fresh, dried, or frozen fruit. Canned fruit in natural juice (without  added sugar). Meat and other protein foods Skinless chicken or turkey. Ground chicken or turkey. Pork with fat trimmed off. Fish and seafood. Egg whites. Dried beans, peas, or lentils. Unsalted nuts, nut butters, and seeds. Unsalted canned beans. Lean cuts of beef with fat trimmed off. Low-sodium, lean deli meat. Dairy Low-fat (1%) or fat-free (skim) milk. Fat-free, low-fat, or reduced-fat cheeses. Nonfat, low-sodium ricotta or cottage cheese. Low-fat or nonfat yogurt. Low-fat, low-sodium cheese. Fats and oils Soft margarine without trans fats. Vegetable oil. Low-fat, reduced-fat, or light mayonnaise and salad dressings (reduced-sodium). Canola, safflower, olive, soybean, and sunflower oils. Avocado. Seasoning and other foods Herbs. Spices. Seasoning mixes without salt. Unsalted popcorn and pretzels. Fat-free sweets. What foods are not recommended? The items listed may not be a complete list. Talk with your dietitian about what dietary choices are best for you. Grains Baked goods made with fat, such as croissants, muffins, or some breads. Dry pasta or rice meal packs. Vegetables Creamed or fried vegetables. Vegetables in a cheese sauce. Regular canned vegetables (not low-sodium or reduced-sodium). Regular canned tomato sauce and paste (not low-sodium or reduced-sodium). Regular tomato and vegetable juice (not low-sodium or reduced-sodium). Pickles. Olives. Fruits Canned fruit in a light or heavy syrup. Fried fruit. Fruit in cream or butter sauce. Meat and other protein foods Fatty cuts of meat. Ribs. Fried meat. Bacon. Sausage. Bologna and other processed lunch meats. Salami. Fatback. Hotdogs. Bratwurst. Salted nuts and seeds. Canned beans with added salt. Canned or smoked fish. Whole eggs or egg yolks. Chicken or turkey with skin. Dairy Whole or 2% milk, cream, and half-and-half. Whole or full-fat cream cheese. Whole-fat or sweetened yogurt. Full-fat cheese. Nondairy creamers. Whipped toppings.  Processed cheese and cheese spreads. Fats and oils Butter. Stick margarine. Lard. Shortening. Ghee. Bacon fat. Tropical oils, such as coconut, palm kernel, or palm oil. Seasoning and other foods Salted popcorn and pretzels. Onion salt, garlic salt, seasoned salt, table salt, and sea salt. Worcestershire sauce. Tartar sauce. Barbecue sauce. Teriyaki sauce. Soy sauce, including reduced-sodium. Steak sauce. Canned and packaged gravies. Fish sauce. Oyster sauce. Cocktail sauce. Horseradish that you find on the shelf. Ketchup. Mustard. Meat flavorings and tenderizers. Bouillon cubes. Hot sauce and Tabasco sauce. Premade or packaged marinades. Premade or packaged taco seasonings. Relishes. Regular salad dressings. Where to find more information:  National Heart, Lung, and Blood Institute: www.nhlbi.nih.gov  American Heart Association: www.heart.org Summary  The DASH eating plan is a healthy eating plan that has been shown to reduce high blood pressure (hypertension). It may also reduce your risk for type 2 diabetes, heart disease, and stroke.  With the DASH eating plan, you should limit salt (sodium) intake to 2,300 mg a day. If you have hypertension, you may need to reduce your sodium intake to 1,500 mg a day.  When on the DASH eating plan, aim to eat more fresh fruits and vegetables, whole grains, lean proteins, low-fat dairy, and heart-healthy fats.  Work with your health care provider or diet and nutrition specialist (dietitian) to adjust your eating plan to your   individual calorie needs. This information is not intended to replace advice given to you by your health care provider. Make sure you discuss any questions you have with your health care provider. Document Revised: 06/21/2017 Document Reviewed: 07/02/2016 Elsevier Patient Education  2020 [employer] Inc.  

## 2020-05-30 NOTE — Progress Notes (Signed)
Subjective:    Patient ID: Kurt Blevins, male    DOB: 1961-06-25, 59 y.o.   MRN: [id number]   Chief Complaint: Medical Management of Chronic Issues    HPI:  1. Primary hypertension BP Readings from Last 3 Encounters:  05/30/20 121/83  01/28/20 (!) 160/91  07/30/19 132/79    Takes medication as prescribed. Takes BP at home, runs ~190-120/80s. Denies chest pain, SOB, or weakness.   2. COPD with asthma ([place]) Uses breathing treatments. States no new or worsening symptoms. Former smoker, quit 11 years ago.   3. Type 2 diabetes mellitus with diabetic polyneuropathy, without long-term current use of insulin (HCC) Lab Results  Component Value Date   HGBA1C 7.1 (H) 01/28/2020  Check CBG at home, runs ~100-130s. Takes medication as prescribed. States he has been watching his diet, no soft drinks for over a year. HA1C is 7.4% today.   4. Mixed hyperlipidemia Lab Results  Component Value Date   CHOL 130 01/28/2020   HDL 37 (L) 01/28/2020   LDLCALC 61 01/28/2020   TRIG 195 (H) 01/28/2020   CHOLHDL 3.5 01/28/2020  Takes medication as prescribed. Avoids fried and fatty foods.    5. Severe obesity (BMI >= 40) (HCC) Wt Readings from Last 3 Encounters:  05/30/20 (!) 306 lb (138.8 kg)  01/28/20 299 lb (135.6 kg)  07/30/19 (!) 315 lb (142.9 kg)   BMI Readings from Last 3 Encounters:  05/30/20 42.68 kg/m  01/28/20 41.70 kg/m  07/30/19 43.93 kg/m    Has begun walking for exercise. Does exercises at home. Weight is up 7lbs since last visit    Outpatient Encounter Medications as of 05/30/2020  Medication Sig   aspirin 81 MG tablet Take 81 mg by mouth daily.   atorvastatin (LIPITOR) 40 MG tablet Take 1 tablet (40 mg total) by mouth daily.   fenofibrate 160 MG tablet Take 1 tablet (160 mg total) by mouth daily.   glipiZIDE (GLUCOTROL) 10 MG tablet Take 1 tablet (10 mg total) by mouth 2 (two) times daily before a meal.   ipratropium-albuterol (DUONEB) 0.5-2.5 (3) MG/3ML  SOLN USE 3 MLS IN NEBULIZER AND INHALE INTO LUNGS 3 TIMES DAILY AS NEEDED FOR SHORTNESS OF BREATH.   lisinopril-hydrochlorothiazide (ZESTORETIC) 20-25 MG tablet Take 1 tablet by mouth daily.   metFORMIN (GLUCOPHAGE) 1000 MG tablet TAKE 1 TABLET TWICE DAILY WITH A MEAL.   ONETOUCH DELICA LANCETS 02V MISC Check blood sugar 1x per day and prn  E 11.9   ONETOUCH VERIO test strip CHECK BLOOD SUGAR ONCE DAILY AS DIRECTED.   No facility-administered encounter medications on file as of 05/30/2020.    Past Surgical History:  Procedure Laterality Date   BACK SURGERY  1990s   COLONOSCOPY N/A 02/25/2018   Procedure: COLONOSCOPY;  Surgeon: Doran Stabler, MD;  Location: [facility];  Service: Gastroenterology;  Laterality: N/A;   HERNIA REPAIR     NECK SURGERY     POLYPECTOMY  02/25/2018   Procedure: POLYPECTOMY;  Surgeon: Doran Stabler, MD;  Location: [facility];  Service: Gastroenterology;;   TONSILLECTOMY      Family History  Problem Relation Age of Onset   Diabetes Mother    Hypertension Mother    Hyperlipidemia Mother    Arthritis Mother    Osteoporosis Mother    Diabetes Father    Hypertension Father    Hyperlipidemia Father    Heart attack Father 73       Late 19s and 110s  Cancer Paternal Grandmother        colon   Healthy Sister    Healthy Brother    Healthy Daughter    Healthy Son    Healthy Son    Healthy Son     New complaints: No new complaints.   Social history: Lives at home with wife, has dogs. Goes out to eat. Doesn't get out much anymore because of Covid.   Controlled substance contract: n/a     Review of Systems  Constitutional: Negative.   HENT: Negative.   Eyes: Negative.   Respiratory: Negative.   Cardiovascular: Negative.   Gastrointestinal: Negative.   Endocrine: Negative.   Genitourinary: Negative.   Musculoskeletal: Negative.   Skin: Negative.   Allergic/Immunologic: Negative.   Neurological: Negative.     Hematological: Negative.   Psychiatric/Behavioral: Negative.   All other systems reviewed and are negative.      Objective:   Physical Exam Vitals and nursing note reviewed.  Constitutional:      Appearance: Normal appearance.  HENT:     Head: Normocephalic and atraumatic.     Right Ear: Tympanic membrane, ear canal and external ear normal.     Left Ear: Tympanic membrane, ear canal and external ear normal.     Nose: Nose normal.     Mouth/Throat:     Mouth: Mucous membranes are moist.     Pharynx: Oropharynx is clear.  Eyes:     Extraocular Movements: Extraocular movements intact.     Conjunctiva/sclera: Conjunctivae normal.     Pupils: Pupils are equal, round, and reactive to light.  Cardiovascular:     Rate and Rhythm: Normal rate and regular rhythm.     Pulses: Normal pulses.     Heart sounds: Normal heart sounds.  Pulmonary:     Effort: Pulmonary effort is normal.     Breath sounds: Decreased air movement present. Examination of the right-lower field reveals wheezing. Wheezing (expiratory wheezes throughout) present.  Abdominal:     General: Abdomen is flat. Bowel sounds are normal.     Palpations: Abdomen is soft.  Musculoskeletal:        General: Normal range of motion.     Cervical back: Normal range of motion.  Skin:    General: Skin is warm and dry.     Capillary Refill: Capillary refill takes less than 2 seconds.  Neurological:     General: No focal deficit present.     Mental Status: He is alert and oriented to person, place, and time. Mental status is at baseline.  Psychiatric:        Mood and Affect: Mood normal.        Behavior: Behavior normal.        Thought Content: Thought content normal.        Judgment: Judgment normal.     BP 121/83    Pulse 78    Temp 97.8 F (36.6 C) (Temporal)    Resp 20    Ht _0  (1.803 m)    Wt (!) 306 lb (138.8 kg)    SpO2 90%    BMI 42.68 kg/m       Assessment & Plan:  Kurt Blevins comes in today with chief  complaint of Medical Management of Chronic Issues   Diagnosis and orders addressed:  1. Primary hypertension Take blood pressure regularly and take medication as prescribed. Avoid foods high in salt and eat a heart healthy diet.  - CBC with Differential/Platelet -  CMP14+EGFR  2. COPD with asthma ([place]) Use inhaler as prescribed. Report any new or worsening shortness of breath.   3. Type 2 diabetes mellitus with diabetic polyneuropathy, without long-term current use of insulin (HCC) Check blood glucose levels regularly. Take medication as prescribed. Avoid foods that are high in carbs and sugar.   - Bayer DCA Hb A1c Waived - Microalbumin / creatinine urine ratio  4. Mixed hyperlipidemia Take medication as prescribed. Avoid foods that have a high fat content.  - Lipid panel  5. Severe obesity (BMI >= 40) (HCC) Exercise regularly. Walking is a great cardiovascular exercise that helps lower blood pressure, cholesterol levels, and blood glucose levels.   6. Screening for prostate cancer Report any urinating difficulty.   - PSA, total and free  Meds ordered this encounter  Medications   ipratropium-albuterol (DUONEB) 0.5-2.5 (3) MG/3ML SOLN    Sig: USE 3 MLS IN NEBULIZER AND INHALE INTO LUNGS 3 TIMES DAILY AS NEEDED FOR SHORTNESS OF BREATH.    Dispense:  360 mL    Refill:  5    Order Specific Question:   Supervising Provider    Answer:   Caryl Pina A [[id number]]   lisinopril-hydrochlorothiazide (ZESTORETIC) 20-25 MG tablet    Sig: Take 1 tablet by mouth daily.    Dispense:  90 tablet    Refill:  1    Order Specific Question:   Supervising Provider    Answer:   Caryl Pina A [[id number]]   atorvastatin (LIPITOR) 40 MG tablet    Sig: Take 1 tablet (40 mg total) by mouth daily.    Dispense:  90 tablet    Refill:  1    Order Specific Question:   Supervising Provider    Answer:   Caryl Pina A [[id number]]   fenofibrate 160 MG tablet    Sig: Take 1 tablet (160  mg total) by mouth daily.    Dispense:  90 tablet    Refill:  1    Order Specific Question:   Supervising Provider    Answer:   Caryl Pina A [[id number]]   glipiZIDE (GLUCOTROL) 10 MG tablet    Sig: Take 1 tablet (10 mg total) by mouth 2 (two) times daily before a meal.    Dispense:  180 tablet    Refill:  1    Order Specific Question:   Supervising Provider    Answer:   Caryl Pina A [[id number]]   metFORMIN (GLUCOPHAGE) 1000 MG tablet    Sig: TAKE 1 TABLET TWICE DAILY WITH A MEAL.    Dispense:  180 tablet    Refill:  1    Order Specific Question:   Supervising Provider    Answer:   Caryl Pina A [id number]    Labs pending Health Maintenance reviewed Diet and exercise encouraged  Follow up plan: Follow up in 3 months.    Mary-Margaret Hassell Done, FNP

## 2020-05-31 LAB — CBC WITH DIFFERENTIAL/PLATELET
Basophils Absolute: 0.1 10*3/uL (ref 0.0–0.2)
Basos: 1 %
EOS (ABSOLUTE): 0.4 10*3/uL (ref 0.0–0.4)
Eos: 4 %
Hematocrit: 42 % (ref 37.5–51.0)
Hemoglobin: 13.9 g/dL (ref 13.0–17.7)
Immature Grans (Abs): 0 10*3/uL (ref 0.0–0.1)
Immature Granulocytes: 0 %
Lymphocytes Absolute: 2.8 10*3/uL (ref 0.7–3.1)
Lymphs: 32 %
MCH: 28.8 pg (ref 26.6–33.0)
MCHC: 33.1 g/dL (ref 31.5–35.7)
MCV: 87 fL (ref 79–97)
Monocytes Absolute: 0.8 10*3/uL (ref 0.1–0.9)
Monocytes: 9 %
Neutrophils Absolute: 4.6 10*3/uL (ref 1.4–7.0)
Neutrophils: 54 %
Platelets: 276 10*3/uL (ref 150–450)
RBC: 4.82 x10E6/uL (ref 4.14–5.80)
RDW: 12.4 % (ref 11.6–15.4)
WBC: 8.6 10*3/uL (ref 3.4–10.8)

## 2020-05-31 LAB — CMP14+EGFR
ALT: 25 IU/L (ref 0–44)
AST: 19 IU/L (ref 0–40)
Albumin/Globulin Ratio: 2 (ref 1.2–2.2)
Albumin: 4.2 g/dL (ref 3.8–4.9)
Alkaline Phosphatase: 62 IU/L (ref 44–121)
BUN/Creatinine Ratio: 16 (ref 9–20)
BUN: 19 mg/dL (ref 6–24)
Bilirubin Total: 0.3 mg/dL (ref 0.0–1.2)
CO2: 27 mmol/L (ref 20–29)
Calcium: 9.6 mg/dL (ref 8.7–10.2)
Chloride: 101 mmol/L (ref 96–106)
Creatinine, Ser: 1.17 mg/dL (ref 0.76–1.27)
GFR calc Af Amer: 79 mL/min/{1.73_m2} (ref >59)
GFR calc non Af Amer: 68 mL/min/{1.73_m2} (ref >59)
Globulin, Total: 2.1 g/dL (ref 1.5–4.5)
Glucose: 74 mg/dL (ref 65–99)
Potassium: 4.3 mmol/L (ref 3.5–5.2)
Sodium: 138 mmol/L (ref 134–144)
Total Protein: 6.3 g/dL (ref 6.0–8.5)

## 2020-05-31 LAB — LIPID PANEL
Chol/HDL Ratio: 3.9 ratio (ref 0.0–5.0)
Cholesterol, Total: 126 mg/dL (ref 100–199)
HDL: 32 mg/dL — ABNORMAL LOW (ref >39)
LDL Chol Calc (NIH): 57 mg/dL (ref 0–99)
Triglycerides: 226 mg/dL — ABNORMAL HIGH (ref 0–149)
VLDL Cholesterol Cal: 37 mg/dL (ref 5–40)

## 2020-05-31 LAB — PSA, TOTAL AND FREE
PSA, Free Pct: 22.7 %
PSA, Free: 0.25 ng/mL
Prostate Specific Ag, Serum: 1.1 ng/mL (ref 0.0–4.0)

## 2020-08-30 ENCOUNTER — Ambulatory Visit (INDEPENDENT_AMBULATORY_CARE_PROVIDER_SITE_OTHER): Payer: Medicare Other | Admitting: Nurse Practitioner

## 2020-08-30 ENCOUNTER — Other Ambulatory Visit: Payer: Self-pay

## 2020-08-30 ENCOUNTER — Encounter: Payer: Self-pay | Admitting: Nurse Practitioner

## 2020-08-30 VITALS — BP 154/92 | HR 68 | Temp 97.9°F | Resp 20 | Ht 71.0 in | Wt 309.0 lb

## 2020-08-30 DIAGNOSIS — E1142 Type 2 diabetes mellitus with diabetic polyneuropathy: Secondary | ICD-10-CM | POA: Diagnosis not present

## 2020-08-30 DIAGNOSIS — E782 Mixed hyperlipidemia: Secondary | ICD-10-CM

## 2020-08-30 DIAGNOSIS — I10 Essential (primary) hypertension: Secondary | ICD-10-CM

## 2020-08-30 DIAGNOSIS — Z125 Encounter for screening for malignant neoplasm of prostate: Secondary | ICD-10-CM

## 2020-08-30 DIAGNOSIS — J449 Chronic obstructive pulmonary disease, unspecified: Secondary | ICD-10-CM | POA: Diagnosis not present

## 2020-08-30 DIAGNOSIS — J4489 Other specified chronic obstructive pulmonary disease: Secondary | ICD-10-CM

## 2020-08-30 LAB — BAYER DCA HB A1C WAIVED: HB A1C (BAYER DCA - WAIVED): 7.8 % — ABNORMAL HIGH (ref <7.0)

## 2020-08-30 MED ORDER — TRELEGY ELLIPTA 100-62.5-25 MCG/INH IN AEPB: 1.0000 | INHALATION_SPRAY | Freq: Every day | RESPIRATORY_TRACT | 3 refills | Status: DC

## 2020-08-30 NOTE — Patient Instructions (Signed)
Chronic Obstructive Pulmonary Disease  Chronic obstructive pulmonary disease (COPD) is a long-term (chronic) lung problem. When you have COPD, it is hard for air to get in and out of your lungs. Usually the condition gets worse over time, and your lungs will never return to normal. There are things you can do to keep yourself as healthy as possible. What are the causes?  Smoking. This is the most common cause.  Certain genes passed from parent to child (inherited). What increases the risk?  Being exposed to secondhand smoke from cigarettes, pipes, or cigars.  Being exposed to chemicals and other irritants, such as fumes and dust in the work environment.  Having chronic lung conditions or infections. What are the signs or symptoms?  Shortness of breath, especially during physical activity.  A long-term cough with a large amount of thick mucus. Sometimes, the cough may not have any mucus (dry cough).  Wheezing.  Breathing quickly.  Skin that looks gray or blue, especially in the fingers, toes, or lips.  Feeling tired (fatigue).  Weight loss.  Chest tightness.  Having infections often.  Episodes when breathing symptoms become much worse (exacerbations). At the later stages of this disease, you may have swelling in the ankles, feet, or legs. How is this treated?  Taking medicines.  Quitting smoking, if you smoke.  Rehabilitation. This includes steps to make your body work better. It may involve a team of specialists.  Doing exercises.  Making changes to your diet.  Using oxygen.  Lung surgery.  Lung transplant.  Comfort measures (palliative care). Follow these instructions at home: Medicines  Take over-the-counter and prescription medicines only as told by your doctor.  Talk to your doctor before taking any cough or allergy medicines. You may need to avoid medicines that cause your lungs to be dry. Lifestyle  If you smoke, stop smoking. Smoking makes the  problem worse.  Do not smoke or use any products that contain nicotine or tobacco. If you need help quitting, ask your doctor.  Avoid being around things that make your breathing worse. This may include smoke, chemicals, and fumes.  Stay active, but remember to rest as well.  Learn and use tips on how to manage stress and control your breathing.  Make sure you get enough sleep. Most adults need at least 7 hours of sleep every night.  Eat healthy foods. Eat smaller meals more often. Rest before meals. Controlled breathing Learn and use tips on how to control your breathing as told by your doctor. Try:  Breathing in (inhaling) through your nose for 1 second. Then, pucker your lips and breath out (exhale) through your lips for 2 seconds.  Putting one hand on your belly (abdomen). Breathe in slowly through your nose for 1 second. Your hand on your belly should move out. Pucker your lips and breathe out slowly through your lips. Your hand on your belly should move in as you breathe out.   Controlled coughing Learn and use controlled coughing to clear mucus from your lungs. Follow these steps: 1. Lean your head a little forward. 2. Breathe in deeply. 3. Try to hold your breath for 3 seconds. 4. Keep your mouth slightly open while coughing 2 times. 5. Spit any mucus out into a tissue. 6. Rest and do the steps again 1 or 2 times as needed. General instructions  Make sure you get all the shots (vaccines) that your doctor recommends. Ask your doctor about a flu shot and a pneumonia shot.    Use oxygen therapy and pulmonary rehabilitation if told by your doctor. If you need home oxygen therapy, ask your doctor if you should buy a tool to measure your oxygen level (oximeter).  Make a COPD action plan with your doctor. This helps you to know what to do if you feel worse than usual.  Manage any other conditions you have as told by your doctor.  Avoid going outside when it is very hot, cold, or  humid.  Avoid people who have a sickness you can catch (contagious).  Keep all follow-up visits. Contact a doctor if:  You cough up more mucus than usual.  There is a change in the color or thickness of the mucus.  It is harder to breathe than usual.  Your breathing is faster than usual.  You have trouble sleeping.  You need to use your medicines more often than usual.  You have trouble doing your normal activities such as getting dressed or walking around the house. Get help right away if:  You have shortness of breath while resting.  You have shortness of breath that stops you from: ? Being able to talk. ? Doing normal activities.  Your chest hurts for longer than 5 minutes.  Your skin color is more blue than usual.  Your pulse oximeter shows that you have low oxygen for longer than 5 minutes.  You have a fever.  You feel too tired to breathe normally. These symptoms may represent a serious problem that is an emergency. Do not wait to see if the symptoms will go away. Get medical help right away. Call your local emergency services (911 in the [place] Do not drive yourself to the hospital. Summary  Chronic obstructive pulmonary disease (COPD) is a long-term lung problem.  The way your lungs work will never return to normal. Usually the condition gets worse over time. There are things you can do to keep yourself as healthy as possible.  Take over-the-counter and prescription medicines only as told by your doctor.  If you smoke, stop. Smoking makes the problem worse. This information is not intended to replace advice given to you by your health care provider. Make sure you discuss any questions you have with your health care provider. Document Revised: 05/17/2020 Document Reviewed: 05/17/2020 Elsevier Patient Education  2021 Elsevier Inc.   

## 2020-08-30 NOTE — Progress Notes (Signed)
Subjective:    Patient ID: Kurt Blevins, male    DOB: March 23, 1961, 60 y.o.   MRN: [id number]   Chief Complaint: No chief complaint on file.    HPI:  1. Primary hypertension Able to take HTN medications without issues. Back pain increases his BP. Low sodium diet, occasionally uses sea salt. Exercise is difficult with back pain and COPD. He is trying to walk more often.   BP Readings from Last 3 Encounters:  08/30/20 (!) 154/92  05/30/20 121/83  01/28/20 (!) 160/91     2. Mixed hyperlipidemia Tries to follow a low fat diet.  Lab Results  Component Value Date   CHOL 126 05/30/2020   HDL 32 (L) 05/30/2020   LDLCALC 57 05/30/2020   TRIG 226 (H) 05/30/2020   CHOLHDL 3.9 05/30/2020     3. Type 2 diabetes mellitus with diabetic polyneuropathy, without long-term current use of insulin (HCC) Low carb diet when he can.   Lab Results  Component Value Date   HGBA1C 7.4 (H) 05/30/2020     4. COPD with asthma ([place]) COPD Assessment Test (CAT) Score 20- Medium impact Coughs upon awakening with phlegm; has trouble with long periods of activity, sometimes uses his wifes rescue inhaler. He tries and avoids fragrances. Quit smoking 12 years ago. Wife smokes indoors, second hand smoke irritates him. He usually goes outside when she smokes. Second hand smoke does exacerbate his symptoms.    5. Severe obesity (BMI >= 40) (HCC) Weight gain in the last few months. He is unable to exercise regularly because of his COPD and back pain.    Wt Readings from Last 3 Encounters:  08/30/20 (!) 309 lb (140.2 kg)  05/30/20 (!) 306 lb (138.8 kg)  01/28/20 299 lb (135.6 kg)       Outpatient Encounter Medications as of 08/30/2020  Medication Sig  . aspirin 81 MG tablet Take 81 mg by mouth daily.  Marland Kitchen atorvastatin (LIPITOR) 40 MG tablet Take 1 tablet (40 mg total) by mouth daily.  . fenofibrate 160 MG tablet Take 1 tablet (160 mg total) by mouth daily.  Marland Kitchen glipiZIDE (GLUCOTROL) 10 MG tablet  Take 1 tablet (10 mg total) by mouth 2 (two) times daily before a meal.  . ipratropium-albuterol (DUONEB) 0.5-2.5 (3) MG/3ML SOLN USE 3 MLS IN NEBULIZER AND INHALE INTO LUNGS 3 TIMES DAILY AS NEEDED FOR SHORTNESS OF BREATH.  Marland Kitchen lisinopril-hydrochlorothiazide (ZESTORETIC) 20-25 MG tablet Take 1 tablet by mouth daily.  . metFORMIN (GLUCOPHAGE) 1000 MG tablet TAKE 1 TABLET TWICE DAILY WITH A MEAL.  Marland Kitchen ONETOUCH DELICA LANCETS 76H MISC Check blood sugar 1x per day and prn  E 11.9  . ONETOUCH VERIO test strip CHECK BLOOD SUGAR ONCE DAILY AS DIRECTED.   No facility-administered encounter medications on file as of 08/30/2020.    Past Surgical History:  Procedure Laterality Date  . BACK SURGERY  1990s  . COLONOSCOPY N/A 02/25/2018   Procedure: COLONOSCOPY;  Surgeon: Doran Stabler, MD;  Location: [facility];  Service: Gastroenterology;  Laterality: N/A;  . HERNIA REPAIR    . NECK SURGERY    . POLYPECTOMY  02/25/2018   Procedure: POLYPECTOMY;  Surgeon: Doran Stabler, MD;  Location: [facility];  Service: Gastroenterology;;  . TONSILLECTOMY      Family History  Problem Relation Age of Onset  . Diabetes Mother   . Hypertension Mother   . Hyperlipidemia Mother   . Arthritis Mother   . Osteoporosis Mother   .  Diabetes Father   . Hypertension Father   . Hyperlipidemia Father   . Heart attack Father 57       Late 73s and 58s  . Cancer Paternal Grandmother        colon  . Healthy Sister   . Healthy Brother   . Healthy Daughter   . Healthy Son   . Healthy Son   . Healthy Son     New complaints: None.   Social history: Lives with wife; has issues affording his COPD medication.   Controlled substance contract: N/A     Review of Systems  Constitutional: Positive for activity change. Negative for fatigue and fever.  Respiratory: Positive for cough, chest tightness and shortness of breath.   Cardiovascular: Negative for chest pain, palpitations and leg swelling.   Gastrointestinal: Negative for constipation and diarrhea.  Genitourinary: Negative for difficulty urinating and dysuria.  Musculoskeletal: Positive for back pain. Negative for joint swelling.  Neurological: Negative for dizziness, syncope and headaches.  Psychiatric/Behavioral: Negative for behavioral problems, confusion and sleep disturbance.       Objective:   Physical Exam Constitutional:      Appearance: Normal appearance. He is obese.  Neck:     Vascular: No carotid bruit.  Cardiovascular:     Rate and Rhythm: Normal rate and regular rhythm.     Pulses: Normal pulses.     Heart sounds: Normal heart sounds.  Pulmonary:     Effort: Prolonged expiration present.     Breath sounds: Examination of the left-lower field reveals wheezing. Decreased breath sounds and wheezing present.  Abdominal:     General: Bowel sounds are normal.  Musculoskeletal:        General: Normal range of motion.     Cervical back: Normal range of motion and neck supple.  Skin:    General: Skin is warm and dry.     Capillary Refill: Capillary refill takes less than 2 seconds.  Neurological:     Mental Status: He is alert and oriented to person, place, and time.  Psychiatric:        Mood and Affect: Mood normal.        Behavior: Behavior normal.        Thought Content: Thought content normal.        Judgment: Judgment normal.     A1C 7.8% today  Vitals:   08/30/20 1433  BP: (!) 154/92  Pulse: 68  Resp: 20  Temp: 97.9 F (36.6 C)  SpO2: 94%        Assessment & Plan:   INIOLUWA BARIS comes in today with chief complaint of Medical Management of Chronic Issues   Diagnosis and orders addressed:  1. Primary hypertension Continue Lisinopril/HCTZ. Monitor BP at home. Continue low sodium diet, encouraged exercise as tolerated - CBC with Differential/Platelet - CMP14+EGFR  2. Mixed hyperlipidemia Continue atorvastatin and fenofibrate. Low fat diet, exercise as tolerated.  - Lipid  panel  3. Type 2 diabetes mellitus with diabetic polyneuropathy, without long-term current use of insulin (HCC) A1C today was 7.8%. Continue metformin. Encouraged low carb diet, exercise as tolerated.   - Bayer DCA Hb A1c Waived  4. COPD with asthma ([place]) Begin TRELEGY 200MG samples given. One puff qd. Call the office if his symptoms do not improve and if he needs assistance with his refill.   5. Severe obesity (BMI >= 40) (HCC) Healthy diet encouraged  6. Screening for prostate cancer  - PSA, total and free  Labs pending Health Maintenance reviewed Diet and exercise encouraged  Follow up plan: 3 months  Dollene Primrose, RN, BSN, FNP-Student  Mary-Margaret Hassell Done, FNP

## 2020-08-31 LAB — LIPID PANEL
Chol/HDL Ratio: 4 ratio (ref 0.0–5.0)
Cholesterol, Total: 136 mg/dL (ref 100–199)
HDL: 34 mg/dL — ABNORMAL LOW (ref >39)
LDL Chol Calc (NIH): 59 mg/dL (ref 0–99)
Triglycerides: 270 mg/dL — ABNORMAL HIGH (ref 0–149)
VLDL Cholesterol Cal: 43 mg/dL — ABNORMAL HIGH (ref 5–40)

## 2020-08-31 LAB — CMP14+EGFR
ALT: 27 IU/L (ref 0–44)
AST: 22 IU/L (ref 0–40)
Albumin/Globulin Ratio: 2.2 (ref 1.2–2.2)
Albumin: 4.6 g/dL (ref 3.8–4.9)
Alkaline Phosphatase: 75 IU/L (ref 44–121)
BUN/Creatinine Ratio: 14 (ref 9–20)
BUN: 15 mg/dL (ref 6–24)
Bilirubin Total: 0.4 mg/dL (ref 0.0–1.2)
CO2: 22 mmol/L (ref 20–29)
Calcium: 10.3 mg/dL — ABNORMAL HIGH (ref 8.7–10.2)
Chloride: 100 mmol/L (ref 96–106)
Creatinine, Ser: 1.04 mg/dL (ref 0.76–1.27)
GFR calc Af Amer: 91 mL/min/{1.73_m2} (ref >59)
GFR calc non Af Amer: 79 mL/min/{1.73_m2} (ref >59)
Globulin, Total: 2.1 g/dL (ref 1.5–4.5)
Glucose: 82 mg/dL (ref 65–99)
Potassium: 4.3 mmol/L (ref 3.5–5.2)
Sodium: 141 mmol/L (ref 134–144)
Total Protein: 6.7 g/dL (ref 6.0–8.5)

## 2020-08-31 LAB — PSA, TOTAL AND FREE
PSA, Free Pct: 27.8 %
PSA, Free: 0.25 ng/mL
Prostate Specific Ag, Serum: 0.9 ng/mL (ref 0.0–4.0)

## 2020-08-31 LAB — CBC WITH DIFFERENTIAL/PLATELET
Basophils Absolute: 0.1 10*3/uL (ref 0.0–0.2)
Basos: 1 %
EOS (ABSOLUTE): 0.3 10*3/uL (ref 0.0–0.4)
Eos: 4 %
Hematocrit: 43.8 % (ref 37.5–51.0)
Hemoglobin: 15.1 g/dL (ref 13.0–17.7)
Immature Grans (Abs): 0.1 10*3/uL (ref 0.0–0.1)
Immature Granulocytes: 1 %
Lymphocytes Absolute: 2.7 10*3/uL (ref 0.7–3.1)
Lymphs: 30 %
MCH: 29.3 pg (ref 26.6–33.0)
MCHC: 34.5 g/dL (ref 31.5–35.7)
MCV: 85 fL (ref 79–97)
Monocytes Absolute: 0.7 10*3/uL (ref 0.1–0.9)
Monocytes: 7 %
Neutrophils Absolute: 5.3 10*3/uL (ref 1.4–7.0)
Neutrophils: 57 %
Platelets: 299 10*3/uL (ref 150–450)
RBC: 5.15 x10E6/uL (ref 4.14–5.80)
RDW: 12.3 % (ref 11.6–15.4)
WBC: 9.1 10*3/uL (ref 3.4–10.8)

## 2020-09-29 ENCOUNTER — Ambulatory Visit (INDEPENDENT_AMBULATORY_CARE_PROVIDER_SITE_OTHER): Payer: Medicare Other

## 2020-09-29 DIAGNOSIS — Z0000 Encounter for general adult medical examination without abnormal findings: Secondary | ICD-10-CM

## 2020-09-29 NOTE — Progress Notes (Signed)
MEDICARE ANNUAL WELLNESS VISIT  09/29/2020  Telephone Visit Disclaimer This Medicare AWV was conducted by telephone due to national recommendations for restrictions regarding the COVID-19 Pandemic (e.g. social distancing).  I verified, using two identifiers, that I am speaking with [name] or their authorized healthcare agent. I discussed the limitations, risks, security, and privacy concerns of performing an evaluation and management service by telephone and the potential availability of an in-person appointment in the future. The patient expressed understanding and agreed to proceed.  Location of Patient: Home Location of Provider (nurse):  [facility]  Subjective:    BECK COFER is a 60 y.o. male patient of Chevis Pretty, [place] who had a Medicare Annual Wellness Visit today via telephone. Kyrie is Disabled and lives with his spouse. He has four children and two step children and six grandchildren. children. He reports that he is socially active and does interact with friends/family regularly. 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  Patient Care Team: Chevis Pretty, FNP as PCP - General (Nurse Practitioner) Doran Stabler, MD as Consulting Physician (Gastroenterology)  Advanced Directives 09/29/2020 09/29/2019 09/25/2018 02/25/2018  Does Patient Have a Medical Advance Directive? No No No No  Would patient like information on creating a medical advance directive? No - Patient declined No - Patient declined Yes (MAU/Ambulatory/Procedural Areas - Information given) No - Patient declined    Hospital Utilization Over the Past 12 Months: # of hospitalizations or ER visits: 0 # of surgeries: 0  Review of Systems    Patient reports that his overall health is worse compared to last year.  History obtained from chart review and the patient  Patient Reported Readings (BP, Pulse, CBG, Weight, etc) none  Pain Assessment Pain :  0-10 Pain Score: 6  Pain Type: Chronic pain Pain Location: Back Pain Orientation: Lower Pain Descriptors / Indicators: Nagging,Aching,Shooting Pain Onset: More than a month ago Pain Frequency: Constant     Current Medications & Allergies (verified) Allergies as of 09/29/2020      Reactions   Penicillins Anaphylaxis, Other (See Comments)   Whelps Has patient had a PCN reaction causing immediate rash, facial/tongue/throat swelling, SOB or lightheadedness with hypotension: Yes Has patient had a PCN reaction causing severe rash involving mucus membranes or skin necrosis: Yes Has patient had a PCN reaction that required hospitalization: Yes Has patient had a PCN reaction occurring within the last 10 years: No If all of the above answers are "NO", then may proceed with Cephalosporin use.   Eggs Or Egg-derived Products Nausea Only, Other (See Comments)   Stomach cramps   Peanut-containing Drug Products Hives, Itching      Medication List       Accurate as of September 29, 2020  2:03 PM. If you have any questions, ask your nurse or doctor.        aspirin 81 MG tablet Take 81 mg by mouth daily.   atorvastatin 40 MG tablet Commonly known as: LIPITOR Take 1 tablet (40 mg total) by mouth daily.   fenofibrate 160 MG tablet Take 1 tablet (160 mg total) by mouth daily.   glipiZIDE 10 MG tablet Commonly known as: GLUCOTROL Take 1 tablet (10 mg total) by mouth 2 (two) times daily before a meal.   ipratropium-albuterol 0.5-2.5 (3) MG/3ML Soln Commonly known as: DUONEB USE 3 MLS IN NEBULIZER AND INHALE INTO LUNGS 3 TIMES DAILY AS NEEDED FOR SHORTNESS OF BREATH.   lisinopril-hydrochlorothiazide  20-25 MG tablet Commonly known as: ZESTORETIC Take 1 tablet by mouth daily.   metFORMIN 1000 MG tablet Commonly known as: GLUCOPHAGE TAKE 1 TABLET TWICE DAILY WITH A MEAL.   OneTouch Delica Lancets 09O Misc Check blood sugar 1x per day and prn  E 11.9   OneTouch Verio test strip Generic  drug: glucose blood CHECK BLOOD SUGAR ONCE DAILY AS DIRECTED.   Trelegy Ellipta 100-62.5-25 MCG/INH Aepb Generic drug: Fluticasone-Umeclidin-Vilant Inhale 1 puff into the lungs daily.       History (reviewed): Past Medical History:  Diagnosis Date  . COPD (chronic obstructive pulmonary disease) ([place])   . Hyperlipidemia   . Hyperlipidemia 11/26/2012  . Hypertension   . Neuropathy    patient reported   Past Surgical History:  Procedure Laterality Date  . BACK SURGERY  1990s  . COLONOSCOPY N/A 02/25/2018   Procedure: COLONOSCOPY;  Surgeon: Doran Stabler, MD;  Location: [facility];  Service: Gastroenterology;  Laterality: N/A;  . HERNIA REPAIR    . NECK SURGERY    . POLYPECTOMY  02/25/2018   Procedure: POLYPECTOMY;  Surgeon: Doran Stabler, MD;  Location: [facility];  Service: Gastroenterology;;  . TONSILLECTOMY     Family History  Problem Relation Age of Onset  . Diabetes Mother   . Hypertension Mother   . Hyperlipidemia Mother   . Arthritis Mother   . Osteoporosis Mother   . Diabetes Father   . Hypertension Father   . Hyperlipidemia Father   . Heart attack Father 61       Late 15s and 18s  . Cancer Paternal Grandmother        colon  . Healthy Sister   . Healthy Brother   . Healthy Daughter   . Healthy Son   . Healthy Son   . Healthy Son    Social History   Socioeconomic History  . Marital status: Married    Spouse name: Not on file  . Number of children: 4  . Years of education: Not on file  . Highest education level: 11th grade  Occupational History  . Occupation: Disabled    Comment: Back problems  Tobacco Use  . Smoking status: Former Smoker    Packs/day: 1.00    Years: 10.00    Pack years: 10.00    Types: Cigarettes    Quit date: 11/26/2008    Years since quitting: 11.8  . Smokeless tobacco: Never Used  Vaping Use  . Vaping Use: Never used  Substance and Sexual Activity  . Alcohol use: No  . Drug use: No  . Sexual activity: Not on  file  Other Topics Concern  . Not on file  Social History Narrative  . Not on file   Social Determinants of Health   Financial Resource Strain: Not on file  Food Insecurity: Not on file  Transportation Needs: Not on file  Physical Activity: Not on file  Stress: Not on file  Social Connections: Not on file    Activities of Daily Living In your present state of health, do you have any difficulty performing the following activities: 09/29/2020  Hearing? N  Vision? N  Difficulty concentrating or making decisions? N  Walking or climbing stairs? Y  Dressing or bathing? N  Doing errands, shopping? N  Preparing Food and eating ? N  Using the Toilet? N  In the past six months, have you accidently leaked urine? N  Do you have problems with loss of bowel control?  N  Managing your Medications? N  Managing your Finances? N  Housekeeping or managing your Housekeeping? N  Some recent data might be hidden   Patient reports that he has back pain that radiates into his legs and sometimes has problems with his balance.  Patient Education/ Literacy How often do you need to have someone help you when you read instructions, pamphlets, or other written materials from your doctor or pharmacy?: 1 - Never What is the last grade level you completed in school?: 11th grade  Exercise Current Exercise Habits: The patient does not participate in regular exercise at present, Exercise limited by: orthopedic condition(s)  Diet Patient reports consuming 3 meals a day and 0 snack(s) a day Patient reports that his primary diet is: Regular Patient reports that he does have regular access to food.   Depression Screen PHQ 2/9 Scores 09/29/2020 05/30/2020 01/28/2020 09/29/2019 07/30/2019 07/02/2019 12/30/2018  PHQ - 2 Score 0 0 0 0 0 0 0     Fall Risk Fall Risk  09/29/2020 08/30/2020 05/30/2020 01/28/2020 07/30/2019  Falls in the past year? 1 1 0 0 1  Comment - - - - -  Number falls in past yr: 1 1 - - 0  Injury with  Fall? 0 0 - - 1  Comment - - - - Knee pain  Risk for fall due to : History of fall(s);Impaired balance/gait History of fall(s) - - -  Follow up Falls evaluation completed Education provided - - -     Objective:  [name] seemed alert and oriented and he participated appropriately during our telephone visit.  Blood Pressure Weight BMI  BP Readings from Last 3 Encounters:  08/30/20 (!) 154/92  05/30/20 121/83  01/28/20 (!) 160/91   Wt Readings from Last 3 Encounters:  08/30/20 (!) 309 lb (140.2 kg)  05/30/20 (!) 306 lb (138.8 kg)  01/28/20 299 lb (135.6 kg)   BMI Readings from Last 1 Encounters:  08/30/20 43.10 kg/m    *Unable to obtain current vital signs, weight, and BMI due to telephone visit type  Hearing/Vision  . Nivek did not seem to have difficulty with hearing/understanding during the telephone conversation . Reports that he has had a formal eye exam by an eye care professional within the past year . Reports that he has not had a formal hearing evaluation within the past year *Unable to fully assess hearing and vision during telephone visit type  Cognitive Function: 6CIT Screen 09/29/2020 09/29/2019 09/29/2019  What Year? 0 points 0 points 0 points  What month? 0 points 0 points 0 points  What time? 0 points 0 points 0 points  Count back from 20 0 points 0 points -  Months in reverse 0 points 0 points -  Repeat phrase 0 points 2 points -  Total Score 0 2 -   (Normal:0-7, Significant for Dysfunction: >8)  Normal Cognitive Function Screening: Yes   Immunization & Health Maintenance Record Immunization History  Administered Date(s) Administered  . PFIZER(Purple Top)SARS-COV-2 Vaccination 12/08/2019, 12/31/2019  . Tdap 02/01/2014    Health Maintenance  Topic Date Due  . OPHTHALMOLOGY EXAM  04/09/2020  . COVID-19 Vaccine (3 - Booster for Pfizer series) 07/01/2020  . PNEUMOCOCCAL POLYSACCHARIDE VACCINE AGE 91-64 HIGH RISK  05/30/2021 (Originally 10/17/1962)   . HIV Screening  05/30/2021 (Originally 10/16/1976)  . HEMOGLOBIN A1C  02/27/2021  . FOOT EXAM  08/30/2021  . TETANUS/TDAP  02/02/2024  . COLONOSCOPY (Pts 45-71yrs Insurance coverage will need to be  confirmed)  02/26/2028  . Hepatitis C Screening  Completed  . HPV VACCINES  Aged Out       Assessment  This is a routine wellness examination for [employer].  Health Maintenance: Due or Overdue Health Maintenance Due  Topic Date Due  . OPHTHALMOLOGY EXAM  04/09/2020  . COVID-19 Vaccine (3 - Booster for Pfizer series) 07/01/2020    [name] does not need a referral for Community Assistance: Care Management:   no Social Work:    no Prescription Assistance:  no Nutrition/Diabetes Education:  no   Plan:  Personalized Goals Goals Addressed            This Visit's Progress   . Patient Stated       09/29/2020 AWV Goal: Improved Nutrition/Diet  . Patient will verbalize understanding that diet plays an important role in overall health and that a poor diet is a risk factor for many chronic medical conditions.  . Over the next year, patient will improve self management of their diet by incorporating fewer sweetened foods & beverages, increased physical activity, better food choices, and watch portion sizes/amount of food eaten at one time. . Patient will utilize available community resources to help with food acquisition if needed (ex: food pantries, [address], etc) . Patient will work with nutrition specialist if a referral was made       Personalized Health Maintenance & Screening Recommendations  Pneumococcal vaccine   Lung Cancer Screening Recommended: no (Low Dose CT Chest recommended if Age 107-80 years, 30 pack-year currently smoking OR have quit w/in past 15 years) Hepatitis C Screening recommended: no HIV Screening recommended: no  Advanced Directives: Written information was not prepared per patient's request.  Referrals & Orders No orders of the defined  types were placed in this encounter.   Follow-up Plan . Follow-up with Chevis Pretty, FNP as planned    I have personally reviewed and noted the following in the patient's chart:   . Medical and social history . Use of alcohol, tobacco or illicit drugs  . Current medications and supplements . Functional ability and status . Nutritional status . Physical activity . Advanced directives . List of other physicians . Hospitalizations, surgeries, and ER visits in previous 12 months . Vitals . Screenings to include cognitive, depression, and falls . Referrals and appointments  In addition, I have reviewed and discussed with [name] certain preventive protocols, quality metrics, and best practice recommendations. A written personalized care plan for preventive services as well as general preventive health recommendations is available and can be mailed to the patient at his request.      Felicity Coyer, LPN   11/13/5359  Patient declined after visit summary

## 2020-11-29 ENCOUNTER — Other Ambulatory Visit: Payer: Self-pay

## 2020-11-29 ENCOUNTER — Encounter: Payer: Self-pay | Admitting: Nurse Practitioner

## 2020-11-29 ENCOUNTER — Ambulatory Visit (INDEPENDENT_AMBULATORY_CARE_PROVIDER_SITE_OTHER): Payer: Medicare Other | Admitting: Nurse Practitioner

## 2020-11-29 VITALS — BP 109/63 | HR 84 | Temp 98.4°F | Resp 20 | Ht 71.0 in | Wt 305.0 lb

## 2020-11-29 DIAGNOSIS — E782 Mixed hyperlipidemia: Secondary | ICD-10-CM | POA: Diagnosis not present

## 2020-11-29 DIAGNOSIS — J449 Chronic obstructive pulmonary disease, unspecified: Secondary | ICD-10-CM

## 2020-11-29 DIAGNOSIS — D124 Benign neoplasm of descending colon: Secondary | ICD-10-CM | POA: Diagnosis not present

## 2020-11-29 DIAGNOSIS — I10 Essential (primary) hypertension: Secondary | ICD-10-CM | POA: Diagnosis not present

## 2020-11-29 DIAGNOSIS — E1142 Type 2 diabetes mellitus with diabetic polyneuropathy: Secondary | ICD-10-CM

## 2020-11-29 DIAGNOSIS — D122 Benign neoplasm of ascending colon: Secondary | ICD-10-CM

## 2020-11-29 DIAGNOSIS — D120 Benign neoplasm of cecum: Secondary | ICD-10-CM

## 2020-11-29 DIAGNOSIS — D123 Benign neoplasm of transverse colon: Secondary | ICD-10-CM | POA: Diagnosis not present

## 2020-11-29 DIAGNOSIS — E1140 Type 2 diabetes mellitus with diabetic neuropathy, unspecified: Secondary | ICD-10-CM

## 2020-11-29 DIAGNOSIS — J4489 Other specified chronic obstructive pulmonary disease: Secondary | ICD-10-CM

## 2020-11-29 LAB — BAYER DCA HB A1C WAIVED: HB A1C (BAYER DCA - WAIVED): 7.9 % — ABNORMAL HIGH (ref <7.0)

## 2020-11-29 MED ORDER — FENOFIBRATE 160 MG PO TABS: 160.0000 mg | ORAL_TABLET | Freq: Every day | ORAL | 1 refills | Status: DC

## 2020-11-29 MED ORDER — TRELEGY ELLIPTA 100-62.5-25 MCG/INH IN AEPB: 1.0000 | INHALATION_SPRAY | Freq: Every day | RESPIRATORY_TRACT | 3 refills | Status: DC

## 2020-11-29 MED ORDER — GLIPIZIDE 10 MG PO TABS: 10.0000 mg | ORAL_TABLET | Freq: Two times a day (BID) | ORAL | 1 refills | Status: DC

## 2020-11-29 MED ORDER — LISINOPRIL-HYDROCHLOROTHIAZIDE 20-25 MG PO TABS
1.0000 | ORAL_TABLET | Freq: Every day | ORAL | 1 refills | Status: DC
Start: 2020-11-29 — End: 2021-03-02

## 2020-11-29 MED ORDER — ATORVASTATIN CALCIUM 40 MG PO TABS: 40.0000 mg | ORAL_TABLET | Freq: Every day | ORAL | 1 refills | Status: DC

## 2020-11-29 MED ORDER — METFORMIN HCL 1000 MG PO TABS: ORAL_TABLET | ORAL | 1 refills | Status: DC

## 2020-11-29 MED ORDER — IPRATROPIUM-ALBUTEROL 0.5-2.5 (3) MG/3ML IN SOLN
RESPIRATORY_TRACT | 5 refills | Status: DC
Start: 2020-11-29 — End: 2021-03-02

## 2020-11-29 NOTE — Progress Notes (Signed)
Subjective:    Patient ID: Kurt Blevins, male    DOB: [birth year], 60 y.o.   MRN: [id number]   Chief Complaint: Medical Management of Chronic Issues    HPI:  1. Primary hypertension No c/o chest pain, sob or headache. Doe snot check blood pressure at home. BP Readings from Last 3 Encounters:  11/29/20 109/63  08/30/20 (!) 154/92  05/30/20 121/83     2. Mixed hyperlipidemia Trying to watch diet and tries to walk some daily. Lab Results  Component Value Date   CHOL 136 08/30/2020   HDL 34 (L) 08/30/2020   LDLCALC 59 08/30/2020   TRIG 270 (H) 08/30/2020   CHOLHDL 4.0 08/30/2020     3. Type 2 diabetes mellitus with diabetic polyneuropathy, without long-term current use of insulin (HCC) Fasting blood sugars have been running around 120. He has had a few lows at night in the 50's and he will have to eat some peanut butter crackers. Lab Results  Component Value Date   HGBA1C 7.8 (H) 08/30/2020     4. Neuropathy due to type 2 diabetes mellitus (HCC) Has numbness and tingling in bil feet. Feels like needles are being tuck in hands and feet.  5. Benign neoplasm of ascending colon 6. Benign neoplasm of cecum 7. Benign neoplasm of descending colon 8. Benign neoplasm of transverse colon Is suppose to have repeat colonoscopy. He was suppose to do during covid but he was afraid to have done then. Was suppose to have been done in 2020.  9. COPD with asthma ([place]) Is doing well with duoneb and trelegy. Pollen bothers him if he is outside to long  10. Severe obesity (BMI >= 40) (HCC) No recent weight changes Wt Readings from Last 3 Encounters:  11/29/20 (!) 305 lb (138.3 kg)  08/30/20 (!) 309 lb (140.2 kg)  05/30/20 (!) 306 lb (138.8 kg)   BMI Readings from Last 3 Encounters:  11/29/20 42.54 kg/m  08/30/20 43.10 kg/m  05/30/20 42.68 kg/m       Outpatient Encounter Medications as of 11/29/2020  Medication Sig  . aspirin 81 MG tablet Take 81 mg by mouth daily.  Marland Kitchen  atorvastatin (LIPITOR) 40 MG tablet Take 1 tablet (40 mg total) by mouth daily.  . fenofibrate 160 MG tablet Take 1 tablet (160 mg total) by mouth daily.  . Fluticasone-Umeclidin-Vilant (TRELEGY ELLIPTA) 100-62.5-25 MCG/INH AEPB Inhale 1 puff into the lungs daily.  Marland Kitchen glipiZIDE (GLUCOTROL) 10 MG tablet Take 1 tablet (10 mg total) by mouth 2 (two) times daily before a meal.  . ipratropium-albuterol (DUONEB) 0.5-2.5 (3) MG/3ML SOLN USE 3 MLS IN NEBULIZER AND INHALE INTO LUNGS 3 TIMES DAILY AS NEEDED FOR SHORTNESS OF BREATH.  Marland Kitchen lisinopril-hydrochlorothiazide (ZESTORETIC) 20-25 MG tablet Take 1 tablet by mouth daily.  . metFORMIN (GLUCOPHAGE) 1000 MG tablet TAKE 1 TABLET TWICE DAILY WITH A MEAL.  Marland Kitchen ONETOUCH DELICA LANCETS 40J MISC Check blood sugar 1x per day and prn  E 11.9  . ONETOUCH VERIO test strip CHECK BLOOD SUGAR ONCE DAILY AS DIRECTED.   No facility-administered encounter medications on file as of 11/29/2020.    Past Surgical History:  Procedure Laterality Date  . BACK SURGERY  1990s  . COLONOSCOPY N/A 02/25/2018   Procedure: COLONOSCOPY;  Surgeon: Doran Stabler, MD;  Location: [facility];  Service: Gastroenterology;  Laterality: N/A;  . HERNIA REPAIR    . NECK SURGERY    . POLYPECTOMY  02/25/2018   Procedure: POLYPECTOMY;  Surgeon:  Doran Stabler, MD;  Location: [facility];  Service: Gastroenterology;;  . TONSILLECTOMY      Family History  Problem Relation Age of Onset  . Diabetes Mother   . Hypertension Mother   . Hyperlipidemia Mother   . Arthritis Mother   . Osteoporosis Mother   . Diabetes Father   . Hypertension Father   . Hyperlipidemia Father   . Heart attack Father 9       Late 13s and 73s  . Cancer Paternal Grandmother        colon  . Healthy Sister   . Healthy Brother   . Healthy Daughter   . Healthy Son   . Healthy Son   . Healthy Son     New complaints: None today  Social history: Lives with wife. Gets grandchildren off bus  daiy  Controlled substance contract: n/a    Review of Systems  Constitutional: Negative for diaphoresis.  Eyes: Negative for pain.  Respiratory: Negative for shortness of breath.   Cardiovascular: Negative for chest pain, palpitations and leg swelling.  Gastrointestinal: Negative for abdominal pain.  Endocrine: Negative for polydipsia.  Skin: Negative for rash.  Neurological: Negative for dizziness, weakness and headaches.  Hematological: Does not bruise/bleed easily.  All other systems reviewed and are negative.      Objective:   Physical Exam Vitals and nursing note reviewed.  Constitutional:      Appearance: Normal appearance. He is well-developed.  HENT:     Head: Normocephalic.     Nose: Nose normal.  Eyes:     Pupils: Pupils are equal, round, and reactive to light.  Neck:     Thyroid: No thyroid mass or thyromegaly.     Vascular: No carotid bruit or JVD.     Trachea: Phonation normal.  Cardiovascular:     Rate and Rhythm: Normal rate and regular rhythm.  Pulmonary:     Effort: Pulmonary effort is normal. No respiratory distress.     Breath sounds: Wheezing (exp throughout) present.  Abdominal:     General: Bowel sounds are normal.     Palpations: Abdomen is soft.     Tenderness: There is no abdominal tenderness.  Musculoskeletal:        General: Normal range of motion.     Cervical back: Normal range of motion and neck supple.  Lymphadenopathy:     Cervical: No cervical adenopathy.  Skin:    General: Skin is warm and dry.  Neurological:     Mental Status: He is alert and oriented to person, place, and time.  Psychiatric:        Behavior: Behavior normal.        Thought Content: Thought content normal.        Judgment: Judgment normal.    BP 109/63   Pulse 84   Temp 98.4 F (36.9 C) (Temporal)   Resp 20   Ht _0  (1.803 m)   Wt (!) 305 lb (138.3 kg)   SpO2 93%   BMI 42.54 kg/m   hgba1c 7.9%      Assessment & Plan:  Kurt Blevins comes  in today with chief complaint of Medical Management of Chronic Issues   Diagnosis and orders addressed:  1. Primary hypertension Low sodium diet - CBC with Differential/Platelet - CMP14+EGFR - lisinopril-hydrochlorothiazide (ZESTORETIC) 20-25 MG tablet; Take 1 tablet by mouth daily.  Dispense: 90 tablet; Refill: 1  2. Mixed hyperlipidemia Low fat diet - Lipid panel - atorvastatin (  LIPITOR) 40 MG tablet; Take 1 tablet (40 mg total) by mouth daily.  Dispense: 90 tablet; Refill: 1 - fenofibrate 160 MG tablet; Take 1 tablet (160 mg total) by mouth daily.  Dispense: 90 tablet; Refill: 1  3. Type 2 diabetes mellitus with diabetic polyneuropathy, without long-term current use of insulin (HCC) Stricter carb counting - Bayer DCA Hb A1c Waived - Microalbumin / creatinine urine ratio - metFORMIN (GLUCOPHAGE) 1000 MG tablet; TAKE 1 TABLET TWICE DAILY WITH A MEAL.  Dispense: 180 tablet; Refill: 1 - glipiZIDE (GLUCOTROL) 10 MG tablet; Take 1 tablet (10 mg total) by mouth 2 (two) times daily before a meal.  Dispense: 180 tablet; Refill: 1  4. Neuropathy due to type 2 diabetes mellitus ([place]) Do not go barefooted  5. Benign neoplasm of ascending colon Referral for repeat colonoscopy  6. Benign neoplasm of cecum - Ambulatory referral to Gastroenterology  7. Benign neoplasm of descending colon  8. Benign neoplasm of transverse colon  9. COPD with asthma ([place]) - ipratropium-albuterol (DUONEB) 0.5-2.5 (3) MG/3ML SOLN; USE 3 MLS IN NEBULIZER AND INHALE INTO LUNGS 3 TIMES DAILY AS NEEDED FOR SHORTNESS OF BREATH.  Dispense: 360 mL; Refill: 5  10. Severe obesity (BMI >= 40) (HCC) Discussed diet and exercise for person with BMI >25 Will recheck weight in 3-6 months    Labs pending Health Maintenance reviewed Diet and exercise encouraged  Follow up plan: 3 months   Mary-Margaret Hassell Done, FNP

## 2020-11-29 NOTE — Patient Instructions (Signed)

## 2020-11-30 LAB — CBC WITH DIFFERENTIAL/PLATELET
Basophils Absolute: 0 10*3/uL (ref 0.0–0.2)
Basos: 0 %
EOS (ABSOLUTE): 0.2 10*3/uL (ref 0.0–0.4)
Eos: 2 %
Hematocrit: 39.5 % (ref 37.5–51.0)
Hemoglobin: 13.9 g/dL (ref 13.0–17.7)
Immature Grans (Abs): 0 10*3/uL (ref 0.0–0.1)
Immature Granulocytes: 0 %
Lymphocytes Absolute: 2.4 10*3/uL (ref 0.7–3.1)
Lymphs: 24 %
MCH: 30.2 pg (ref 26.6–33.0)
MCHC: 35.2 g/dL (ref 31.5–35.7)
MCV: 86 fL (ref 79–97)
Monocytes Absolute: 0.8 10*3/uL (ref 0.1–0.9)
Monocytes: 8 %
Neutrophils Absolute: 6.3 10*3/uL (ref 1.4–7.0)
Neutrophils: 66 %
Platelets: 271 10*3/uL (ref 150–450)
RBC: 4.6 x10E6/uL (ref 4.14–5.80)
RDW: 12.9 % (ref 11.6–15.4)
WBC: 9.8 10*3/uL (ref 3.4–10.8)

## 2020-11-30 LAB — CMP14+EGFR
ALT: 30 IU/L (ref 0–44)
AST: 26 IU/L (ref 0–40)
Albumin/Globulin Ratio: 2.3 — ABNORMAL HIGH (ref 1.2–2.2)
Albumin: 4.4 g/dL (ref 3.8–4.9)
Alkaline Phosphatase: 62 IU/L (ref 44–121)
BUN/Creatinine Ratio: 17 (ref 9–20)
BUN: 23 mg/dL (ref 6–24)
Bilirubin Total: 0.2 mg/dL (ref 0.0–1.2)
CO2: 24 mmol/L (ref 20–29)
Calcium: 9.6 mg/dL (ref 8.7–10.2)
Chloride: 100 mmol/L (ref 96–106)
Creatinine, Ser: 1.32 mg/dL — ABNORMAL HIGH (ref 0.76–1.27)
Globulin, Total: 1.9 g/dL (ref 1.5–4.5)
Glucose: 94 mg/dL (ref 65–99)
Potassium: 4.2 mmol/L (ref 3.5–5.2)
Sodium: 140 mmol/L (ref 134–144)
Total Protein: 6.3 g/dL (ref 6.0–8.5)
eGFR: 62 mL/min/{1.73_m2} (ref >59)

## 2020-11-30 LAB — LIPID PANEL
Chol/HDL Ratio: 3.2 ratio (ref 0.0–5.0)
Cholesterol, Total: 116 mg/dL (ref 100–199)
HDL: 36 mg/dL — ABNORMAL LOW (ref >39)
LDL Chol Calc (NIH): 49 mg/dL (ref 0–99)
Triglycerides: 185 mg/dL — ABNORMAL HIGH (ref 0–149)
VLDL Cholesterol Cal: 31 mg/dL (ref 5–40)

## 2021-01-19 NOTE — Telephone Encounter (Signed)
No answer and voicemail box is full.  

## 2021-02-01 ENCOUNTER — Telehealth: Payer: Self-pay

## 2021-02-01 NOTE — Telephone Encounter (Signed)
Called pt to reschedule colonoscopy for August with available providers (Dr. Tarri Glenn and Dr. Candis Schatz). Unable to LVM d/t VM being full.

## 2021-03-02 ENCOUNTER — Other Ambulatory Visit: Payer: Self-pay

## 2021-03-02 ENCOUNTER — Ambulatory Visit (INDEPENDENT_AMBULATORY_CARE_PROVIDER_SITE_OTHER): Payer: Medicare Other | Admitting: Nurse Practitioner

## 2021-03-02 ENCOUNTER — Encounter: Payer: Self-pay | Admitting: Nurse Practitioner

## 2021-03-02 VITALS — BP 128/84 | HR 78 | Temp 97.8°F | Resp 20 | Ht 71.0 in | Wt 304.0 lb

## 2021-03-02 DIAGNOSIS — E1140 Type 2 diabetes mellitus with diabetic neuropathy, unspecified: Secondary | ICD-10-CM

## 2021-03-02 DIAGNOSIS — E1142 Type 2 diabetes mellitus with diabetic polyneuropathy: Secondary | ICD-10-CM

## 2021-03-02 DIAGNOSIS — J449 Chronic obstructive pulmonary disease, unspecified: Secondary | ICD-10-CM

## 2021-03-02 DIAGNOSIS — I10 Essential (primary) hypertension: Secondary | ICD-10-CM

## 2021-03-02 DIAGNOSIS — E782 Mixed hyperlipidemia: Secondary | ICD-10-CM | POA: Diagnosis not present

## 2021-03-02 LAB — BAYER DCA HB A1C WAIVED: HB A1C (BAYER DCA - WAIVED): 8 % — ABNORMAL HIGH (ref <7.0)

## 2021-03-02 MED ORDER — LISINOPRIL-HYDROCHLOROTHIAZIDE 20-25 MG PO TABS: 1.0000 | ORAL_TABLET | Freq: Every day | ORAL | 1 refills | Status: DC

## 2021-03-02 MED ORDER — ATORVASTATIN CALCIUM 40 MG PO TABS: 40.0000 mg | ORAL_TABLET | Freq: Every day | ORAL | 1 refills | Status: DC

## 2021-03-02 MED ORDER — GLIPIZIDE 10 MG PO TABS: 10.0000 mg | ORAL_TABLET | Freq: Two times a day (BID) | ORAL | 1 refills | Status: DC

## 2021-03-02 MED ORDER — FENOFIBRATE 160 MG PO TABS: 160.0000 mg | ORAL_TABLET | Freq: Every day | ORAL | 1 refills | Status: DC

## 2021-03-02 MED ORDER — IPRATROPIUM-ALBUTEROL 0.5-2.5 (3) MG/3ML IN SOLN: RESPIRATORY_TRACT | 5 refills | Status: DC

## 2021-03-02 MED ORDER — TRELEGY ELLIPTA 100-62.5-25 MCG/INH IN AEPB: 1.0000 | INHALATION_SPRAY | Freq: Every day | RESPIRATORY_TRACT | 3 refills | Status: DC

## 2021-03-02 MED ORDER — METFORMIN HCL 1000 MG PO TABS: ORAL_TABLET | ORAL | 1 refills | Status: DC

## 2021-03-02 NOTE — Progress Notes (Signed)
Subjective:    Patient ID: Kurt Blevins, male    DOB: 1960-12-15, 60 y.o.   MRN: [id number]   Chief Complaint: medical management of chronic issues     HPI:  1. Primary hypertension No c/o chest pain, sob or headache. Does not check blood pressure at home. BP Readings from Last 3 Encounters:  03/02/21 (!) 155/94  11/29/20 109/63  08/30/20 (!) 154/92      2. Mixed hyperlipidemia Does not watch diet and does no dedicated exercise. Lab Results  Component Value Date   CHOL 116 11/29/2020   HDL 36 (L) 11/29/2020   LDLCALC 49 11/29/2020   TRIG 185 (H) 11/29/2020   CHOLHDL 3.2 11/29/2020     3. Type 2 diabetes mellitus with diabetic polyneuropathy, without long-term current use of insulin (HCC) Fasting blood sugars are unchanged from previous. He is not very good at watching diet. Lab Results  Component Value Date   HGBA1C 7.9 (H) 11/29/2020     4. Neuropathy due to type 2 diabetes mellitus (HCC) Has numbness and tingling in both feet.   5. COPD with asthma ([place]) Uses his duoneb at home and helps a lot when he is short of breath. Uses trelegy daily.  6. Severe obesity (BMI >= 40) (HCC) No recent weight changes Wt Readings from Last 3 Encounters:  03/02/21 (!) 304 lb (137.9 kg)  11/29/20 (!) 305 lb (138.3 kg)  08/30/20 (!) 309 lb (140.2 kg)   BMI Readings from Last 3 Encounters:  03/02/21 42.40 kg/m  11/29/20 42.54 kg/m  08/30/20 43.10 kg/m       Outpatient Encounter Medications as of 03/02/2021  Medication Sig   aspirin 81 MG tablet Take 81 mg by mouth daily.   atorvastatin (LIPITOR) 40 MG tablet Take 1 tablet (40 mg total) by mouth daily.   fenofibrate 160 MG tablet Take 1 tablet (160 mg total) by mouth daily.   Fluticasone-Umeclidin-Vilant (TRELEGY ELLIPTA) 100-62.5-25 MCG/INH AEPB Inhale 1 puff into the lungs daily.   glipiZIDE (GLUCOTROL) 10 MG tablet Take 1 tablet (10 mg total) by mouth 2 (two) times daily before a meal.   ipratropium-albuterol  (DUONEB) 0.5-2.5 (3) MG/3ML SOLN USE 3 MLS IN NEBULIZER AND INHALE INTO LUNGS 3 TIMES DAILY AS NEEDED FOR SHORTNESS OF BREATH.   lisinopril-hydrochlorothiazide (ZESTORETIC) 20-25 MG tablet Take 1 tablet by mouth daily.   metFORMIN (GLUCOPHAGE) 1000 MG tablet TAKE 1 TABLET TWICE DAILY WITH A MEAL.   ONETOUCH DELICA LANCETS 34J MISC Check blood sugar 1x per day and prn  E 11.9   ONETOUCH VERIO test strip CHECK BLOOD SUGAR ONCE DAILY AS DIRECTED.   No facility-administered encounter medications on file as of 03/02/2021.    Past Surgical History:  Procedure Laterality Date   BACK SURGERY  1990s   COLONOSCOPY N/A 02/25/2018   Procedure: COLONOSCOPY;  Surgeon: Doran Stabler, MD;  Location: [facility];  Service: Gastroenterology;  Laterality: N/A;   HERNIA REPAIR     NECK SURGERY     POLYPECTOMY  02/25/2018   Procedure: POLYPECTOMY;  Surgeon: Doran Stabler, MD;  Location: [facility];  Service: Gastroenterology;;   TONSILLECTOMY      Family History  Problem Relation Age of Onset   Diabetes Mother    Hypertension Mother    Hyperlipidemia Mother    Arthritis Mother    Osteoporosis Mother    Diabetes Father    Hypertension Father    Hyperlipidemia Father    Heart attack  Father 58       Late 50s and 52s   Cancer Paternal Grandmother        colon   Healthy Sister    Healthy Brother    Healthy Daughter    Healthy Son    Healthy Son    Healthy Son     New complaints: None today  Social history: Lives with his wife. Helps take care of grandchildren  Controlled substance contract: n/a     Review of Systems  Constitutional:  Negative for diaphoresis.  Eyes:  Negative for pain.  Respiratory:  Negative for shortness of breath.   Cardiovascular:  Negative for chest pain, palpitations and leg swelling.  Gastrointestinal:  Negative for abdominal pain.  Endocrine: Negative for polydipsia.  Skin:  Negative for rash.  Neurological:  Negative for dizziness, weakness and  headaches.  Hematological:  Does not bruise/bleed easily.  All other systems reviewed and are negative.     Objective:   Physical Exam Vitals and nursing note reviewed.  Constitutional:      Appearance: Normal appearance. He is well-developed.  HENT:     Head: Normocephalic.     Nose: Nose normal.  Eyes:     Pupils: Pupils are equal, round, and reactive to light.  Neck:     Thyroid: No thyroid mass or thyromegaly.     Vascular: No carotid bruit or JVD.     Trachea: Phonation normal.  Cardiovascular:     Rate and Rhythm: Normal rate and regular rhythm.  Pulmonary:     Effort: Pulmonary effort is normal. No respiratory distress.     Breath sounds: Normal breath sounds.  Abdominal:     General: Bowel sounds are normal.     Palpations: Abdomen is soft.     Tenderness: There is no abdominal tenderness.  Musculoskeletal:        General: Normal range of motion.     Cervical back: Normal range of motion and neck supple.     Comments: compression hose in place  Lymphadenopathy:     Cervical: No cervical adenopathy.  Skin:    General: Skin is warm and dry.  Neurological:     Mental Status: He is alert and oriented to person, place, and time.  Psychiatric:        Behavior: Behavior normal.        Thought Content: Thought content normal.        Judgment: Judgment normal.    BP 128/84   Pulse 78   Temp 97.8 F (36.6 C) (Temporal)   Resp 20   Ht _0  (1.803 m)   Wt (!) 304 lb (137.9 kg)   SpO2 92%   BMI 42.40 kg/m   HGBA1c 8.0%      Assessment & Plan:   Kurt Blevins comes in today with chief complaint of Medical Management of Chronic Issues   Diagnosis and orders addressed:  1. Primary hypertension Low sodium diet - CBC with Differential/Platelet - CMP14+EGFR - lisinopril-hydrochlorothiazide (ZESTORETIC) 20-25 MG tablet; Take 1 tablet by mouth daily.  Dispense: 90 tablet; Refill: 1  2. Mixed hyperlipidemia Low fat diet - Lipid panel - atorvastatin  (LIPITOR) 40 MG tablet; Take 1 tablet (40 mg total) by mouth daily.  Dispense: 90 tablet; Refill: 1 - fenofibrate 160 MG tablet; Take 1 tablet (160 mg total) by mouth daily.  Dispense: 90 tablet; Refill: 1  3. Type 2 diabetes mellitus with diabetic polyneuropathy, without long-term current use of insulin (  [place]) Stricter carb counting - Bayer DCA Hb A1c Waived - glipiZIDE (GLUCOTROL) 10 MG tablet; Take 1 tablet (10 mg total) by mouth 2 (two) times daily before a meal.  Dispense: 180 tablet; Refill: 1 - metFORMIN (GLUCOPHAGE) 1000 MG tablet; TAKE 1 TABLET TWICE DAILY WITH A MEAL.  Dispense: 180 tablet; Refill: 1  4. Neuropathy due to type 2 diabetes mellitus ([place]) Do not go barefooted  5. COPD with asthma ([place]) - ipratropium-albuterol (DUONEB) 0.5-2.5 (3) MG/3ML SOLN; USE 3 MLS IN NEBULIZER AND INHALE INTO LUNGS 3 TIMES DAILY AS NEEDED FOR SHORTNESS OF BREATH.  Dispense: 360 mL; Refill: 5 - Fluticasone-Umeclidin-Vilant (TRELEGY ELLIPTA) 100-62.5-25 MCG/INH AEPB; Inhale 1 puff into the lungs daily.  Dispense: 3 each; Refill: 3  6. Severe obesity (BMI >= 40) (HCC) Discussed diet and exercise for person with BMI >25 Will recheck weight in 3-6 months    Labs pending Health Maintenance reviewed Diet and exercise encouraged  Follow up plan: 1 month diabetes only   Mary-Margaret Hassell Done, FNP

## 2021-03-02 NOTE — Patient Instructions (Signed)
https://www.diabeteseducator.org/docs/default-source/living-with-diabetes/conquering-the-grocery-store-v1.pdf?sfvrsn=4">  Carbohydrate Counting for Diabetes Mellitus, Adult Carbohydrate counting is a method of keeping track of how many carbohydrates you eat. Eating carbohydrates naturally increases the amount of sugar (glucose) in the blood. Counting how many carbohydrates you eat improves your bloodglucose control, which helps you manage your diabetes. It is important to know how many carbohydrates you can safely have in each meal. This is different for every person. A dietitian can help you make a meal plan and calculate how many carbohydrates you should have at each meal andsnack. What foods contain carbohydrates? Carbohydrates are found in the following foods: Grains, such as breads and cereals. Dried beans and soy products. Starchy vegetables, such as potatoes, peas, and corn. Fruit and fruit juices. Milk and yogurt. Sweets and snack foods, such as cake, cookies, candy, chips, and soft drinks. How do I count carbohydrates in foods? There are two ways to count carbohydrates in food. You can read food labels or learn standard serving sizes of foods. You can use either of the methods or acombination of both. Using the Nutrition Facts label The Nutrition Facts list is included on the labels of almost all packaged foods and beverages in the [place] It includes: The serving size. Information about nutrients in each serving, including the grams (g) of carbohydrate per serving. To use the Nutrition Facts: Decide how many servings you will have. Multiply the number of servings by the number of carbohydrates per serving. The resulting number is the total amount of carbohydrates that you will be having. Learning the standard serving sizes of foods When you eat carbohydrate foods that are not packaged or do not include Nutrition Facts on the label, you need to measure the servings in order to count the  amount of carbohydrates. Measure the foods that you will eat with a food scale or measuring cup, if needed. Decide how many standard-size servings you will eat. Multiply the number of servings by 15. For foods that contain carbohydrates, one serving equals 15 g of carbohydrates. For example, if you eat 2 cups or 10 oz (300 g) of strawberries, you will have eaten 2 servings and 30 g of carbohydrates (2 servings x 15 g = 30 g). For foods that have more than one food mixed, such as soups and casseroles, you must count the carbohydrates in each food that is included. The following list contains standard serving sizes of common carbohydrate-rich foods. Each of these servings has about 15 g of carbohydrates: 1 slice of bread. 1 six-inch (15 cm) tortilla. ? cup or 2 oz (53 g) cooked rice or pasta.  cup or 3 oz (85 g) cooked or canned, drained and rinsed beans or lentils.  cup or 3 oz (85 g) starchy vegetable, such as peas, corn, or squash.  cup or 4 oz (120 g) hot cereal.  cup or 3 oz (85 g) boiled or mashed potatoes, or  or 3 oz (85 g) of a large baked potato.  cup or 4 fl oz (118 mL) fruit juice. 1 cup or 8 fl oz (237 mL) milk. 1 small or 4 oz (106 g) apple.  or 2 oz (63 g) of a medium banana. 1 cup or 5 oz (150 g) strawberries. 3 cups or 1 oz (24 g) popped popcorn. What is an example of carbohydrate counting? To calculate the number of carbohydrates in this sample meal, follow the [place]own below. Sample meal 3 oz (85 g) chicken breast. ? cup or 4 oz (106 g) brown rice.    cup or 3 oz (85 g) corn. 1 cup or 8 fl oz (237 mL) milk. 1 cup or 5 oz (150 g) strawberries with sugar-free whipped topping. Carbohydrate calculation Identify the foods that contain carbohydrates: Rice. Corn. Milk. Strawberries. Calculate how many servings you have of each food: 2 servings rice. 1 serving corn. 1 serving milk. 1 serving strawberries. Multiply each number of servings by 15 g: 2 servings  rice x 15 g = 30 g. 1 serving corn x 15 g = 15 g. 1 serving milk x 15 g = 15 g. 1 serving strawberries x 15 g = 15 g. Add together all of the amounts to find the total grams of carbohydrates eaten: 30 g + 15 g + 15 g + 15 g = 75 g of carbohydrates total. What are tips for following this plan? Shopping Develop a meal plan and then make a shopping list. Buy fresh and frozen vegetables, fresh and frozen fruit, dairy, eggs, beans, lentils, and whole grains. Look at food labels. Choose foods that have more fiber and less sugar. Avoid processed foods and foods with added sugars. Meal planning Aim to have the same amount of carbohydrates at each meal and for each snack time. Plan to have regular, balanced meals and snacks. Where to find more information American Diabetes Association: www.diabetes.org Centers for Disease Control and Prevention: www.cdc.gov Summary Carbohydrate counting is a method of keeping track of how many carbohydrates you eat. Eating carbohydrates naturally increases the amount of sugar (glucose) in the blood. Counting how many carbohydrates you eat improves your blood glucose control, which helps you manage your diabetes. A dietitian can help you make a meal plan and calculate how many carbohydrates you should have at each meal and snack. This information is not intended to replace advice given to you by your health care provider. Make sure you discuss any questions you have with your healthcare provider. Document Revised: 07/09/2019 Document Reviewed: 07/10/2019 Elsevier Patient Education  2021 Elsevier Inc.  

## 2021-03-03 LAB — CMP14+EGFR
ALT: 31 IU/L (ref 0–44)
AST: 27 IU/L (ref 0–40)
Albumin/Globulin Ratio: 2.6 — ABNORMAL HIGH (ref 1.2–2.2)
Albumin: 4.9 g/dL (ref 3.8–4.9)
Alkaline Phosphatase: 62 IU/L (ref 44–121)
BUN/Creatinine Ratio: 16 (ref 9–20)
BUN: 18 mg/dL (ref 6–24)
Bilirubin Total: 0.4 mg/dL (ref 0.0–1.2)
CO2: 26 mmol/L (ref 20–29)
Calcium: 10.5 mg/dL — ABNORMAL HIGH (ref 8.7–10.2)
Chloride: 102 mmol/L (ref 96–106)
Creatinine, Ser: 1.12 mg/dL (ref 0.76–1.27)
Globulin, Total: 1.9 g/dL (ref 1.5–4.5)
Glucose: 82 mg/dL (ref 65–99)
Potassium: 4.4 mmol/L (ref 3.5–5.2)
Sodium: 145 mmol/L — ABNORMAL HIGH (ref 134–144)
Total Protein: 6.8 g/dL (ref 6.0–8.5)
eGFR: 76 mL/min/{1.73_m2} (ref >59)

## 2021-03-03 LAB — CBC WITH DIFFERENTIAL/PLATELET
Basophils Absolute: 0.1 10*3/uL (ref 0.0–0.2)
Basos: 1 %
EOS (ABSOLUTE): 0.3 10*3/uL (ref 0.0–0.4)
Eos: 3 %
Hematocrit: 45 % (ref 37.5–51.0)
Hemoglobin: 15.1 g/dL (ref 13.0–17.7)
Immature Grans (Abs): 0 10*3/uL (ref 0.0–0.1)
Immature Granulocytes: 0 %
Lymphocytes Absolute: 3.1 10*3/uL (ref 0.7–3.1)
Lymphs: 37 %
MCH: 29.3 pg (ref 26.6–33.0)
MCHC: 33.6 g/dL (ref 31.5–35.7)
MCV: 87 fL (ref 79–97)
Monocytes Absolute: 0.8 10*3/uL (ref 0.1–0.9)
Monocytes: 9 %
Neutrophils Absolute: 4 10*3/uL (ref 1.4–7.0)
Neutrophils: 50 %
Platelets: 293 10*3/uL (ref 150–450)
RBC: 5.16 x10E6/uL (ref 4.14–5.80)
RDW: 12.7 % (ref 11.6–15.4)
WBC: 8.3 10*3/uL (ref 3.4–10.8)

## 2021-03-03 LAB — LIPID PANEL
Chol/HDL Ratio: 4 ratio (ref 0.0–5.0)
Cholesterol, Total: 136 mg/dL (ref 100–199)
HDL: 34 mg/dL — ABNORMAL LOW (ref >39)
LDL Chol Calc (NIH): 57 mg/dL (ref 0–99)
Triglycerides: 284 mg/dL — ABNORMAL HIGH (ref 0–149)
VLDL Cholesterol Cal: 45 mg/dL — ABNORMAL HIGH (ref 5–40)

## 2021-03-29 ENCOUNTER — Other Ambulatory Visit: Payer: Self-pay | Admitting: Nurse Practitioner

## 2021-03-29 DIAGNOSIS — E119 Type 2 diabetes mellitus without complications: Secondary | ICD-10-CM

## 2021-03-30 ENCOUNTER — Ambulatory Visit (INDEPENDENT_AMBULATORY_CARE_PROVIDER_SITE_OTHER): Payer: Medicare Other | Admitting: Nurse Practitioner

## 2021-03-30 ENCOUNTER — Encounter: Payer: Self-pay | Admitting: Nurse Practitioner

## 2021-03-30 ENCOUNTER — Other Ambulatory Visit: Payer: Self-pay

## 2021-03-30 VITALS — BP 152/91 | HR 78 | Temp 97.7°F | Resp 20 | Ht 71.0 in | Wt 305.0 lb

## 2021-03-30 DIAGNOSIS — E1142 Type 2 diabetes mellitus with diabetic polyneuropathy: Secondary | ICD-10-CM | POA: Diagnosis not present

## 2021-03-30 LAB — BAYER DCA HB A1C WAIVED: HB A1C (BAYER DCA - WAIVED): 7.1 % — ABNORMAL HIGH (ref 4.8–5.6)

## 2021-03-30 NOTE — Progress Notes (Signed)
   Subjective:    Patient ID: Kurt Blevins, male    DOB: May 01, 1961, 60 y.o.   MRN: [id number]  BRADIN HARDIN in today with chief complaint of diabetes recheck.  1. Type 2 diabetes mellitus with diabetic polyneuropathy, without long-term current use of insulin ([place]) Patient was last sen on 03/02/21. He had not been watching his diet and not checking blood sugars. His hgba1c was 8.0%. we did not makes any changes to his meds. Discussed stricter carb counting. He says he has been doing better with his diet. He quit drinking soft drinks and eating a lot of fruit.Fasting blood sugars are running in 110-140.   BP Readings from Last 3 Encounters:  03/30/21 (!) 152/91  03/02/21 128/84  11/29/20 109/63    Wt Readings from Last 3 Encounters:  03/30/21 (!) 305 lb (138.3 kg)  03/02/21 (!) 304 lb (137.9 kg)  11/29/20 (!) 305 lb (138.3 kg)   BMI Readings from Last 3 Encounters:  03/30/21 42.54 kg/m  03/02/21 42.40 kg/m  11/29/20 42.54 kg/m       Review of Systems  Constitutional:  Negative for diaphoresis.  Eyes:  Negative for pain.  Respiratory:  Negative for shortness of breath.   Cardiovascular:  Negative for chest pain, palpitations and leg swelling.  Gastrointestinal:  Negative for abdominal pain.  Endocrine: Negative for polydipsia.  Skin:  Negative for rash.  Neurological:  Negative for dizziness, weakness and headaches.  Hematological:  Does not bruise/bleed easily.  All other systems reviewed and are negative.     Objective:   Physical Exam Vitals reviewed.  Constitutional:      Appearance: He is obese.  Cardiovascular:     Rate and Rhythm: Normal rate and regular rhythm.     Heart sounds: Normal heart sounds.  Pulmonary:     Effort: Pulmonary effort is normal.     Breath sounds: Normal breath sounds.  Skin:    General: Skin is warm.  Neurological:     General: No focal deficit present.     Mental Status: He is alert and oriented to person, place, and time.   Psychiatric:        Mood and Affect: Mood normal.        Behavior: Behavior normal.     Hgba1c discussed at appointment 7.1%     Assessment & Plan:   [name] in today with chief complaint of Diabetes   1. Type 2 diabetes mellitus with diabetic polyneuropathy, without long-term current use of insulin (HCC) Continue to watch diet No medication changes Follow up in 3 months - Bayer DCA Hb A1c Waived    The above assessment and management plan was discussed with the patient. The patient verbalized understanding of and has agreed to the management plan. Patient is aware to call the clinic if symptoms persist or worsen. Patient is aware when to return to the clinic for a follow-up visit. Patient educated on when it is appropriate to go to the emergency department.   Mary-Margaret Hassell Done, FNP

## 2021-07-03 ENCOUNTER — Ambulatory Visit (INDEPENDENT_AMBULATORY_CARE_PROVIDER_SITE_OTHER): Payer: Medicare Other | Admitting: Nurse Practitioner

## 2021-07-03 ENCOUNTER — Encounter: Payer: Self-pay | Admitting: Nurse Practitioner

## 2021-07-03 VITALS — BP 142/80 | HR 64 | Temp 97.2°F | Resp 20 | Ht 71.0 in | Wt 304.0 lb

## 2021-07-03 DIAGNOSIS — E782 Mixed hyperlipidemia: Secondary | ICD-10-CM | POA: Diagnosis not present

## 2021-07-03 DIAGNOSIS — E1140 Type 2 diabetes mellitus with diabetic neuropathy, unspecified: Secondary | ICD-10-CM | POA: Diagnosis not present

## 2021-07-03 DIAGNOSIS — J449 Chronic obstructive pulmonary disease, unspecified: Secondary | ICD-10-CM | POA: Diagnosis not present

## 2021-07-03 DIAGNOSIS — R200 Anesthesia of skin: Secondary | ICD-10-CM | POA: Diagnosis not present

## 2021-07-03 DIAGNOSIS — E1142 Type 2 diabetes mellitus with diabetic polyneuropathy: Secondary | ICD-10-CM | POA: Diagnosis not present

## 2021-07-03 DIAGNOSIS — I10 Essential (primary) hypertension: Secondary | ICD-10-CM

## 2021-07-03 LAB — BAYER DCA HB A1C WAIVED: HB A1C (BAYER DCA - WAIVED): 8.1 % — ABNORMAL HIGH (ref 4.8–5.6)

## 2021-07-03 MED ORDER — GLIPIZIDE 10 MG PO TABS: 10.0000 mg | ORAL_TABLET | Freq: Two times a day (BID) | ORAL | 1 refills | Status: DC

## 2021-07-03 MED ORDER — IPRATROPIUM-ALBUTEROL 0.5-2.5 (3) MG/3ML IN SOLN: RESPIRATORY_TRACT | 5 refills | Status: DC

## 2021-07-03 MED ORDER — METFORMIN HCL 1000 MG PO TABS: ORAL_TABLET | ORAL | 1 refills | Status: DC

## 2021-07-03 MED ORDER — FENOFIBRATE 160 MG PO TABS: 160.0000 mg | ORAL_TABLET | Freq: Every day | ORAL | 1 refills | Status: DC

## 2021-07-03 MED ORDER — LISINOPRIL-HYDROCHLOROTHIAZIDE 20-25 MG PO TABS: 1.0000 | ORAL_TABLET | Freq: Every day | ORAL | 1 refills | Status: DC

## 2021-07-03 NOTE — Progress Notes (Signed)
Subjective:    Patient ID: Kurt Blevins, male    DOB: [birth year], 60 y.o.   MRN: [id number]  Chief Complaint: medical management of chronic issues     HPI:  1. Primary hypertension No c/o chest pain, sob or headache. Doe snot check blood pressure at home very often. BP Readings from Last 3 Encounters:  07/03/21 (!) 169/84  03/30/21 (!) 152/91  03/02/21 128/84      2. Mixed hyperlipidemia Doe snot watch diet and does no dedicated exercise. Lab Results  Component Value Date   CHOL 136 03/02/2021   HDL 34 (L) 03/02/2021   LDLCALC 57 03/02/2021   TRIG 284 (H) 03/02/2021   CHOLHDL 4.0 03/02/2021     3. Type 2 diabetes mellitus with diabetic polyneuropathy, without long-term current use of insulin (HCC) Fasting blood sugars are running around 130. He does not check it every day. He denies any symptoms of low blood sugar. Lab Results  Component Value Date   HGBA1C 7.1 (H) 03/30/2021     4. Neuropathy due to type 2 diabetes mellitus (HCC) Has numbness and tingling in bil feet   5. COPD with asthma ([place]) Has chronic bronchitis. Is currently on trelegy and duoneb. He is doing ok. Gets SOB with to much activity.  6. Severe obesity (BMI >= 40) (HCC) No recent weight changes Wt Readings from Last 3 Encounters:  03/30/21 (!) 305 lb (138.3 kg)  03/02/21 (!) 304 lb (137.9 kg)  11/29/20 (!) 305 lb (138.3 kg)   BMI Readings from Last 3 Encounters:  03/30/21 42.54 kg/m  03/02/21 42.40 kg/m  11/29/20 42.54 kg/m       Outpatient Encounter Medications as of 07/03/2021  Medication Sig   aspirin 81 MG tablet Take 81 mg by mouth daily.   atorvastatin (LIPITOR) 40 MG tablet Take 1 tablet (40 mg total) by mouth daily.   fenofibrate 160 MG tablet Take 1 tablet (160 mg total) by mouth daily.   Fluticasone-Umeclidin-Vilant (TRELEGY ELLIPTA) 100-62.5-25 MCG/INH AEPB Inhale 1 puff into the lungs daily.   glipiZIDE (GLUCOTROL) 10 MG tablet Take 1 tablet (10 mg total) by  mouth 2 (two) times daily before a meal.   glucose blood (ONETOUCH VERIO) test strip Check BS one daily Dx E11.42   ipratropium-albuterol (DUONEB) 0.5-2.5 (3) MG/3ML SOLN USE 3 MLS IN NEBULIZER AND INHALE INTO LUNGS 3 TIMES DAILY AS NEEDED FOR SHORTNESS OF BREATH.   lisinopril-hydrochlorothiazide (ZESTORETIC) 20-25 MG tablet Take 1 tablet by mouth daily.   metFORMIN (GLUCOPHAGE) 1000 MG tablet TAKE 1 TABLET TWICE DAILY WITH A MEAL.   ONETOUCH DELICA LANCETS 75O MISC Check blood sugar 1x per day and prn  E 11.9   No facility-administered encounter medications on file as of 07/03/2021.    Past Surgical History:  Procedure Laterality Date   BACK SURGERY  1990s   COLONOSCOPY N/A 02/25/2018   Procedure: COLONOSCOPY;  Surgeon: Doran Stabler, MD;  Location: [facility];  Service: Gastroenterology;  Laterality: N/A;   HERNIA REPAIR     NECK SURGERY     POLYPECTOMY  02/25/2018   Procedure: POLYPECTOMY;  Surgeon: Doran Stabler, MD;  Location: [facility];  Service: Gastroenterology;;   TONSILLECTOMY      Family History  Problem Relation Age of Onset   Diabetes Mother    Hypertension Mother    Hyperlipidemia Mother    Arthritis Mother    Osteoporosis Mother    Diabetes Father    Hypertension Father  Hyperlipidemia Father    Heart attack Father 2       Late 39s and 66s   Cancer Paternal Grandmother        colon   Healthy Sister    Healthy Brother    Healthy Daughter    Healthy Son    Healthy Son    Healthy Son     New complaints: Both hands are going to sleep at night. Mainly thumb, index and middle finger. Wake shim up almost every night.  Social history: Lives with his wife  Controlled substance contract: n/a     Review of Systems  Constitutional:  Negative for diaphoresis.  Eyes:  Negative for pain.  Respiratory:  Negative for shortness of breath.   Cardiovascular:  Negative for chest pain, palpitations and leg swelling.  Gastrointestinal:  Negative for  abdominal pain.  Endocrine: Negative for polydipsia.  Skin:  Negative for rash.  Neurological:  Negative for dizziness, weakness and headaches.  Hematological:  Does not bruise/bleed easily.  All other systems reviewed and are negative.     Objective:   Physical Exam Vitals and nursing note reviewed.  Constitutional:      Appearance: Normal appearance. He is well-developed.  HENT:     Head: Normocephalic.     Nose: Nose normal.     Mouth/Throat:     Mouth: Mucous membranes are moist.     Pharynx: Oropharynx is clear.  Eyes:     Pupils: Pupils are equal, round, and reactive to light.  Neck:     Thyroid: No thyroid mass or thyromegaly.     Vascular: No carotid bruit or JVD.     Trachea: Phonation normal.  Cardiovascular:     Rate and Rhythm: Normal rate and regular rhythm.  Pulmonary:     Effort: Pulmonary effort is normal. No respiratory distress.     Breath sounds: Wheezing (exp throughout) present.  Abdominal:     General: Bowel sounds are normal.     Palpations: Abdomen is soft.     Tenderness: There is no abdominal tenderness.  Musculoskeletal:        General: Normal range of motion.     Cervical back: Normal range of motion and neck supple.     Comments: Positive phalen test bil hands Grips equal bil.  Lymphadenopathy:     Cervical: No cervical adenopathy.  Skin:    General: Skin is warm and dry.  Neurological:     Mental Status: He is alert and oriented to person, place, and time.  Psychiatric:        Behavior: Behavior normal.        Thought Content: Thought content normal.        Judgment: Judgment normal.   BP (!) 142/80   Pulse 64   Temp (!) 97.2 F (36.2 C) (Temporal)   Resp 20   Ht _0  (1.803 m)   Wt (!) 304 lb (137.9 kg)   SpO2 93%   BMI 42.40 kg/m    HGB1c 8.1%      Assessment & Plan:  Kurt Blevins in today with chief complaint of Medical Management of Chronic Issues   1. Primary hypertension Low sodium diet - CBC with  Differential/Platelet - CMP14+EGFR - lisinopril-hydrochlorothiazide (ZESTORETIC) 20-25 MG tablet; Take 1 tablet by mouth daily.  Dispense: 90 tablet; Refill: 1  2. Mixed hyperlipidemia Low fat diet - Lipid panel - fenofibrate 160 MG tablet; Take 1 tablet (160 mg total) by mouth daily.  Dispense: 90 tablet; Refill: 1  3. Type 2 diabetes mellitus with diabetic polyneuropathy, without long-term current use of insulin ([place]) Get back on diet - Bayer DCA Hb A1c Waived - glipiZIDE (GLUCOTROL) 10 MG tablet; Take 1 tablet (10 mg total) by mouth 2 (two) times daily before a meal.  Dispense: 180 tablet; Refill: 1 - metFORMIN (GLUCOPHAGE) 1000 MG tablet; TAKE 1 TABLET TWICE DAILY WITH A MEAL.  Dispense: 180 tablet; Refill: 1  4. Neuropathy due to type 2 diabetes mellitus (HCC) Check feet daily Do not go barefooted  5. COPD with asthma ([place]) - ipratropium-albuterol (DUONEB) 0.5-2.5 (3) MG/3ML SOLN; USE 3 MLS IN NEBULIZER AND INHALE INTO LUNGS 3 TIMES DAILY AS NEEDED FOR SHORTNESS OF BREATH.  Dispense: 360 mL; Refill: 5  6. Severe obesity (BMI >= 40) (HCC) Discussed diet and exercise for person with BMI >25 Will recheck weight in 3-6 months   7. Hand numbness Will talk after test results are in - Nerve conduction test; Future    The above assessment and management plan was discussed with the patient. The patient verbalized understanding of and has agreed to the management plan. Patient is aware to call the clinic if symptoms persist or worsen. Patient is aware when to return to the clinic for a follow-up visit. Patient educated on when it is appropriate to go to the emergency department.   Mary-Margaret Hassell Done, FNP

## 2021-07-03 NOTE — Patient Instructions (Signed)
Carpal Tunnel Syndrome Carpal tunnel syndrome is a condition that causes pain, weakness, and numbness in your hand and arm. Numbness is when you cannot feel an area in your body. The carpal tunnel is a narrow area that is on the palm side of your wrist. Repeated wrist motion or certain diseases may cause swelling in the tunnel. This swelling can pinch the main nerve in the wrist. This nerve is called the median nerve. What are the causes? This condition may be caused by: Moving your hand and wrist over and over again while doing a task. Injury to the wrist. Arthritis. A sac of fluid (cyst) or abnormal growth (tumor) in the carpal tunnel. Fluid buildup during pregnancy. Use of tools that vibrate. Sometimes the cause is not known. What increases the risk? The following factors may make you more likely to have this condition: Having a job that makes you do these things: Move your hand over and over again. Work with tools that vibrate, such as drills or sanders. Being a woman. Having diabetes, obesity, thyroid problems, or kidney failure. What are the signs or symptoms? Symptoms of this condition include: A tingling feeling in your fingers. Tingling or loss of feeling in your hand. Pain in your entire arm. This pain may get worse when you bend your wrist and elbow for a long time. Pain in your wrist that goes up your arm to your shoulder. Pain that goes down into your palm or fingers. Weakness in your hands. You may find it hard to grab and hold items. You may feel worse at night. How is this treated? This condition may be treated with: Lifestyle changes. You will be asked to stop or change the activity that caused your problem. Doing exercises and activities that make bones, muscles, and tendons stronger (physical therapy). Learning how to use your hand again (occupational therapy). Medicines for pain and swelling. You may have injections in your wrist. A wrist splint or  brace. Surgery. Follow these instructions at home: If you have a splint or brace: Wear the splint or brace as told by your doctor. Take it off only as told by your doctor. Loosen the splint if your fingers: Tingle. Become numb. Turn cold and blue. Keep the splint or brace clean. If the splint or brace is not waterproof: Do not let it get wet. Cover it with a watertight covering when you take a bath or a shower. Managing pain, stiffness, and swelling If told, put ice on the painful area: If you have a removable splint or brace, remove it as told by your doctor. Put ice in a plastic bag. Place a towel between your skin and the bag. Leave the ice on for 20 minutes, 2-3 times per day. Do not fall asleep with the cold pack on your skin. Take off the ice if your skin turns bright red. This is very important. If you cannot feel pain, heat, or cold, you have a greater risk of damage to the area. Move your fingers often to reduce stiffness and swelling. General instructions Take over-the-counter and prescription medicines only as told by your doctor. Rest your wrist from any activity that may cause pain. If needed, talk with your boss at work about changes that can help your wrist heal. Do exercises as told by your doctor, physical therapist, or occupational therapist. Keep all follow-up visits. Contact a doctor if: You have new symptoms. Medicine does not help your pain. Your symptoms get worse. Get help right away  if: You have very bad numbness or tingling in your wrist or hand. Summary Carpal tunnel syndrome is a condition that causes pain in your hand and arm. It is often caused by repeated wrist motions. Lifestyle changes and medicines are used to treat this problem. Surgery may help in very bad cases. Follow your doctor's instructions about wearing a splint, resting your wrist, keeping follow-up visits, and calling for help. This information is not intended to replace advice given  to you by your health care provider. Make sure you discuss any questions you have with your health care provider. Document Revised: 11/19/2019 Document Reviewed: 11/19/2019 Elsevier Patient Education  [place].

## 2021-07-04 LAB — CBC WITH DIFFERENTIAL/PLATELET
Basophils Absolute: 0.1 10*3/uL (ref 0.0–0.2)
Basos: 1 %
EOS (ABSOLUTE): 0.4 10*3/uL (ref 0.0–0.4)
Eos: 5 %
Hematocrit: 44.4 % (ref 37.5–51.0)
Hemoglobin: 14.9 g/dL (ref 13.0–17.7)
Immature Grans (Abs): 0 10*3/uL (ref 0.0–0.1)
Immature Granulocytes: 1 %
Lymphocytes Absolute: 3 10*3/uL (ref 0.7–3.1)
Lymphs: 35 %
MCH: 29 pg (ref 26.6–33.0)
MCHC: 33.6 g/dL (ref 31.5–35.7)
MCV: 86 fL (ref 79–97)
Monocytes Absolute: 0.7 10*3/uL (ref 0.1–0.9)
Monocytes: 8 %
Neutrophils Absolute: 4.5 10*3/uL (ref 1.4–7.0)
Neutrophils: 50 %
Platelets: 278 10*3/uL (ref 150–450)
RBC: 5.14 x10E6/uL (ref 4.14–5.80)
RDW: 12.5 % (ref 11.6–15.4)
WBC: 8.7 10*3/uL (ref 3.4–10.8)

## 2021-07-04 LAB — CMP14+EGFR
ALT: 24 IU/L (ref 0–44)
AST: 21 IU/L (ref 0–40)
Albumin/Globulin Ratio: 2.9 — ABNORMAL HIGH (ref 1.2–2.2)
Albumin: 4.7 g/dL (ref 3.8–4.9)
Alkaline Phosphatase: 65 IU/L (ref 44–121)
BUN/Creatinine Ratio: 11 (ref 9–20)
BUN: 13 mg/dL (ref 6–24)
Bilirubin Total: 0.4 mg/dL (ref 0.0–1.2)
CO2: 24 mmol/L (ref 20–29)
Calcium: 9.7 mg/dL (ref 8.7–10.2)
Chloride: 100 mmol/L (ref 96–106)
Creatinine, Ser: 1.16 mg/dL (ref 0.76–1.27)
Globulin, Total: 1.6 g/dL (ref 1.5–4.5)
Glucose: 67 mg/dL — ABNORMAL LOW (ref 70–99)
Potassium: 4.3 mmol/L (ref 3.5–5.2)
Sodium: 142 mmol/L (ref 134–144)
Total Protein: 6.3 g/dL (ref 6.0–8.5)
eGFR: 73 mL/min/{1.73_m2} (ref >59)

## 2021-07-04 LAB — LIPID PANEL
Chol/HDL Ratio: 3.4 ratio (ref 0.0–5.0)
Cholesterol, Total: 127 mg/dL (ref 100–199)
HDL: 37 mg/dL — ABNORMAL LOW (ref >39)
LDL Chol Calc (NIH): 53 mg/dL (ref 0–99)
Triglycerides: 229 mg/dL — ABNORMAL HIGH (ref 0–149)
VLDL Cholesterol Cal: 37 mg/dL (ref 5–40)

## 2021-09-29 ENCOUNTER — Encounter: Payer: Self-pay | Admitting: Nurse Practitioner

## 2021-09-29 ENCOUNTER — Ambulatory Visit (INDEPENDENT_AMBULATORY_CARE_PROVIDER_SITE_OTHER): Payer: Medicare Other | Admitting: Nurse Practitioner

## 2021-09-29 VITALS — BP 134/74 | HR 65 | Temp 97.8°F | Ht 71.0 in | Wt 308.2 lb

## 2021-09-29 DIAGNOSIS — J449 Chronic obstructive pulmonary disease, unspecified: Secondary | ICD-10-CM | POA: Diagnosis not present

## 2021-09-29 DIAGNOSIS — E782 Mixed hyperlipidemia: Secondary | ICD-10-CM

## 2021-09-29 DIAGNOSIS — E1140 Type 2 diabetes mellitus with diabetic neuropathy, unspecified: Secondary | ICD-10-CM | POA: Diagnosis not present

## 2021-09-29 DIAGNOSIS — I10 Essential (primary) hypertension: Secondary | ICD-10-CM

## 2021-09-29 DIAGNOSIS — E1142 Type 2 diabetes mellitus with diabetic polyneuropathy: Secondary | ICD-10-CM | POA: Diagnosis not present

## 2021-09-29 LAB — BAYER DCA HB A1C WAIVED: HB A1C (BAYER DCA - WAIVED): 7.9 % — ABNORMAL HIGH (ref 4.8–5.6)

## 2021-09-29 MED ORDER — GLIPIZIDE 10 MG PO TABS: 10.0000 mg | ORAL_TABLET | Freq: Two times a day (BID) | ORAL | 1 refills | Status: DC

## 2021-09-29 MED ORDER — TRELEGY ELLIPTA 100-62.5-25 MCG/ACT IN AEPB: 1.0000 | INHALATION_SPRAY | Freq: Every day | RESPIRATORY_TRACT | 11 refills | Status: DC

## 2021-09-29 MED ORDER — FENOFIBRATE 160 MG PO TABS: 160.0000 mg | ORAL_TABLET | Freq: Every day | ORAL | 1 refills | Status: DC

## 2021-09-29 MED ORDER — ATORVASTATIN CALCIUM 40 MG PO TABS: 40.0000 mg | ORAL_TABLET | Freq: Every day | ORAL | 1 refills | Status: DC

## 2021-09-29 MED ORDER — METFORMIN HCL 1000 MG PO TABS: ORAL_TABLET | ORAL | 1 refills | Status: DC

## 2021-09-29 MED ORDER — IPRATROPIUM-ALBUTEROL 0.5-2.5 (3) MG/3ML IN SOLN: RESPIRATORY_TRACT | 5 refills | Status: DC

## 2021-09-29 MED ORDER — LISINOPRIL-HYDROCHLOROTHIAZIDE 20-25 MG PO TABS: 1.0000 | ORAL_TABLET | Freq: Every day | ORAL | 1 refills | Status: DC

## 2021-09-29 NOTE — Progress Notes (Signed)
Subjective:    Patient ID: Kurt Blevins, male    DOB: 1960/10/08, 61 y.o.   MRN: [id number]   Chief Complaint: medical management of chronic issues     HPI:  Kurt Blevins is a 61 y.o. who identifies as a male who was assigned male at birth.   Social history: Lives with: wife Work history: disability   Comes in today for follow up of the following chronic medical issues:  1. Primary hypertension No c/o chest pain or headaches. He doe snot check his blood pressure at home. BP Readings from Last 3 Encounters:  09/29/21 134/74  07/03/21 (!) 142/80  03/30/21 (!) 152/91      2. Mixed hyperlipidemia Doe snot watch diet nad doe sno exercise at all. Lab Results  Component Value Date   CHOL 127 07/03/2021   HDL 37 (L) 07/03/2021   LDLCALC 53 07/03/2021   TRIG 229 (H) 07/03/2021   CHOLHDL 3.4 07/03/2021   The ASCVD Risk score (Arnett DK, et al., 2019) failed to calculate for the following reasons:   The valid total cholesterol range is 130 to 320 mg/dL   3. COPD with asthma ([place]) He has occasional cough and has constant wheezing  4. Type 2 diabetes mellitus with diabetic polyneuropathy, without long-term current use of insulin (HCC) Fasting blood sugars are running around 140-160. He has had no low blood sugars. Lab Results  Component Value Date   HGBA1C 8.1 (H) 07/03/2021     5. Neuropathy due to type 2 diabetes mellitus (HCC) Has constant burning and tingling of feet. His wife checks his feet frequently  6. Severe obesity (BMI >= 40) (HCC) No recent weight changes Wt Readings from Last 3 Encounters:  09/29/21 (!) 308 lb 3.2 oz (139.8 kg)  07/03/21 (!) 304 lb (137.9 kg)  03/30/21 (!) 305 lb (138.3 kg)   BMI Readings from Last 3 Encounters:  09/29/21 42.99 kg/m  07/03/21 42.40 kg/m  03/30/21 42.54 kg/m      New complaints: None today  Allergies  Allergen Reactions   Penicillins Anaphylaxis and Other (See Comments)    [place] Has patient had  a PCN reaction causing immediate rash, facial/tongue/throat swelling, SOB or lightheadedness with hypotension: Yes Has patient had a PCN reaction causing severe rash involving mucus membranes or skin necrosis: Yes Has patient had a PCN reaction that required hospitalization: Yes Has patient had a PCN reaction occurring within the last 10 years: No If all of the above answers are "NO", then may proceed with Cephalosporin use.    Eggs Or Egg-Derived Products Nausea Only and Other (See Comments)    Stomach cramps   Peanut-Containing Drug Products Hives and Itching   Outpatient Encounter Medications as of 09/29/2021  Medication Sig   aspirin 81 MG tablet Take 81 mg by mouth daily.   atorvastatin (LIPITOR) 40 MG tablet Take 1 tablet (40 mg total) by mouth daily.   fenofibrate 160 MG tablet Take 1 tablet (160 mg total) by mouth daily.   Fluticasone-Umeclidin-Vilant (TRELEGY ELLIPTA) 100-62.5-25 MCG/INH AEPB Inhale 1 puff into the lungs daily.   glipiZIDE (GLUCOTROL) 10 MG tablet Take 1 tablet (10 mg total) by mouth 2 (two) times daily before a meal.   glucose blood (ONETOUCH VERIO) test strip Check BS one daily Dx E11.42   ipratropium-albuterol (DUONEB) 0.5-2.5 (3) MG/3ML SOLN USE 3 MLS IN NEBULIZER AND INHALE INTO LUNGS 3 TIMES DAILY AS NEEDED FOR SHORTNESS OF BREATH.   lisinopril-hydrochlorothiazide (ZESTORETIC) 20-25 MG  tablet Take 1 tablet by mouth daily.   metFORMIN (GLUCOPHAGE) 1000 MG tablet TAKE 1 TABLET TWICE DAILY WITH A MEAL.   ONETOUCH DELICA LANCETS 96E MISC Check blood sugar 1x per day and prn  E 11.9   No facility-administered encounter medications on file as of 09/29/2021.    Past Surgical History:  Procedure Laterality Date   BACK SURGERY  1990s   COLONOSCOPY N/A 02/25/2018   Procedure: COLONOSCOPY;  Surgeon: Doran Stabler, MD;  Location: [facility];  Service: Gastroenterology;  Laterality: N/A;   HERNIA REPAIR     NECK SURGERY     POLYPECTOMY  02/25/2018   Procedure:  POLYPECTOMY;  Surgeon: Doran Stabler, MD;  Location: [facility];  Service: Gastroenterology;;   TONSILLECTOMY      Family History  Problem Relation Age of Onset   Diabetes Mother    Hypertension Mother    Hyperlipidemia Mother    Arthritis Mother    Osteoporosis Mother    Diabetes Father    Hypertension Father    Hyperlipidemia Father    Heart attack Father 30       Late 71s and 43s   Cancer Paternal Grandmother        colon   Healthy Sister    Healthy Brother    Healthy Daughter    Healthy Son    Healthy Son    Healthy Son       Controlled substance contract: n/a      Review of Systems  Constitutional:  Negative for diaphoresis.  Eyes:  Negative for pain.  Respiratory:  Negative for shortness of breath.   Cardiovascular:  Negative for chest pain, palpitations and leg swelling.  Gastrointestinal:  Negative for abdominal pain.  Endocrine: Negative for polydipsia.  Skin:  Negative for rash.  Neurological:  Negative for dizziness, weakness and headaches.  Hematological:  Does not bruise/bleed easily.  All other systems reviewed and are negative.     Objective:   Physical Exam Vitals and nursing note reviewed.  Constitutional:      Appearance: Normal appearance. He is well-developed.  HENT:     Head: Normocephalic.     Nose: Nose normal.     Mouth/Throat:     Mouth: Mucous membranes are moist.     Pharynx: Oropharynx is clear.  Eyes:     Pupils: Pupils are equal, round, and reactive to light.  Neck:     Thyroid: No thyroid mass or thyromegaly.     Vascular: No carotid bruit or JVD.     Trachea: Phonation normal.  Cardiovascular:     Rate and Rhythm: Normal rate and regular rhythm.  Pulmonary:     Effort: Pulmonary effort is normal. No respiratory distress.     Breath sounds: Normal breath sounds.  Abdominal:     General: Bowel sounds are normal.     Palpations: Abdomen is soft.     Tenderness: There is no abdominal tenderness.   Musculoskeletal:        General: Normal range of motion.     Cervical back: Normal range of motion and neck supple.  Lymphadenopathy:     Cervical: No cervical adenopathy.  Skin:    General: Skin is warm and dry.  Neurological:     Mental Status: He is alert and oriented to person, place, and time.  Psychiatric:        Behavior: Behavior normal.        Thought Content: Thought content normal.  Judgment: Judgment normal.    BP 134/74    Pulse 65    Temp 97.8 F (36.6 C) (Temporal)    Ht 5' 11" (1.803 m)    Wt (!) 308 lb 3.2 oz (139.8 kg)    SpO2 95%    BMI 42.99 kg/m   HGBA1c 7.9%     Assessment & Plan:  Kurt Blevins comes in today with chief complaint of Hyperlipidemia, Diabetes, Hypertension, and Medication Management   Diagnosis and orders addressed:  1. Primary hypertension Low sodium diet - Bayer DCA Hb A1c Waived - CBC with Differential/Platelet - CMP14+EGFR - Lipid panel - Microalbumin / creatinine urine ratio - lisinopril-hydrochlorothiazide (ZESTORETIC) 20-25 MG tablet; Take 1 tablet by mouth daily.  Dispense: 90 tablet; Refill: 1  2. Mixed hyperlipidemia Low fat diet - fenofibrate 160 MG tablet; Take 1 tablet (160 mg total) by mouth daily.  Dispense: 90 tablet; Refill: 1 - atorvastatin (LIPITOR) 40 MG tablet; Take 1 tablet (40 mg total) by mouth daily.  Dispense: 90 tablet; Refill: 1  3. COPD with asthma ([place]) Continue all inhalers - ipratropium-albuterol (DUONEB) 0.5-2.5 (3) MG/3ML SOLN; USE 3 MLS IN NEBULIZER AND INHALE INTO LUNGS 3 TIMES DAILY AS NEEDED FOR SHORTNESS OF BREATH.  Dispense: 360 mL; Refill: 5 - Fluticasone-Umeclidin-Vilant (TRELEGY ELLIPTA) 100-62.5-25 MCG/ACT AEPB; Inhale 1 puff into the lungs daily.  Dispense: 1 each; Refill: 11  4. Type 2 diabetes mellitus with diabetic polyneuropathy, without long-term current use of insulin (HCC) Continue to watch carbs in diet - Bayer DCA Hb A1c Waived - CBC with Differential/Platelet -  CMP14+EGFR - Lipid panel - Microalbumin / creatinine urine ratio - glipiZIDE (GLUCOTROL) 10 MG tablet; Take 1 tablet (10 mg total) by mouth 2 (two) times daily before a meal.  Dispense: 180 tablet; Refill: 1 - metFORMIN (GLUCOPHAGE) 1000 MG tablet; TAKE 1 TABLET TWICE DAILY WITH A MEAL.  Dispense: 180 tablet; Refill: 1  5. Neuropathy due to type 2 diabetes mellitus (HCC) Check feet daily Do not go barefooted  6. Severe obesity (BMI >= 40) (HCC) Discussed diet and exercise for person with BMI >25 Will recheck weight in 3-6 months    Labs pending Health Maintenance reviewed Diet and exercise encouraged  Follow up plan: 3 month   [place], FNP

## 2021-09-29 NOTE — Patient Instructions (Signed)

## 2021-09-30 LAB — LIPID PANEL
Chol/HDL Ratio: 4.3 ratio (ref 0.0–5.0)
Cholesterol, Total: 130 mg/dL (ref 100–199)
HDL: 30 mg/dL — ABNORMAL LOW (ref >39)
LDL Chol Calc (NIH): 39 mg/dL (ref 0–99)
Triglycerides: 423 mg/dL — ABNORMAL HIGH (ref 0–149)
VLDL Cholesterol Cal: 61 mg/dL — ABNORMAL HIGH (ref 5–40)

## 2021-09-30 LAB — CMP14+EGFR
ALT: 28 IU/L (ref 0–44)
AST: 24 IU/L (ref 0–40)
Albumin/Globulin Ratio: 2.9 — ABNORMAL HIGH (ref 1.2–2.2)
Albumin: 4.7 g/dL (ref 3.8–4.9)
Alkaline Phosphatase: 68 IU/L (ref 44–121)
BUN/Creatinine Ratio: 21 — ABNORMAL HIGH (ref 9–20)
BUN: 24 mg/dL (ref 6–24)
Bilirubin Total: 0.3 mg/dL (ref 0.0–1.2)
CO2: 24 mmol/L (ref 20–29)
Calcium: 10.1 mg/dL (ref 8.7–10.2)
Chloride: 102 mmol/L (ref 96–106)
Creatinine, Ser: 1.12 mg/dL (ref 0.76–1.27)
Globulin, Total: 1.6 g/dL (ref 1.5–4.5)
Glucose: 111 mg/dL — ABNORMAL HIGH (ref 70–99)
Potassium: 4.7 mmol/L (ref 3.5–5.2)
Sodium: 144 mmol/L (ref 134–144)
Total Protein: 6.3 g/dL (ref 6.0–8.5)
eGFR: 76 mL/min/{1.73_m2} (ref >59)

## 2021-09-30 LAB — CBC WITH DIFFERENTIAL/PLATELET
Basophils Absolute: 0.1 10*3/uL (ref 0.0–0.2)
Basos: 1 %
EOS (ABSOLUTE): 0.4 10*3/uL (ref 0.0–0.4)
Eos: 5 %
Hematocrit: 43.2 % (ref 37.5–51.0)
Hemoglobin: 14.6 g/dL (ref 13.0–17.7)
Immature Grans (Abs): 0 10*3/uL (ref 0.0–0.1)
Immature Granulocytes: 0 %
Lymphocytes Absolute: 3 10*3/uL (ref 0.7–3.1)
Lymphs: 34 %
MCH: 29 pg (ref 26.6–33.0)
MCHC: 33.8 g/dL (ref 31.5–35.7)
MCV: 86 fL (ref 79–97)
Monocytes Absolute: 0.9 10*3/uL (ref 0.1–0.9)
Monocytes: 10 %
Neutrophils Absolute: 4.5 10*3/uL (ref 1.4–7.0)
Neutrophils: 50 %
Platelets: 290 10*3/uL (ref 150–450)
RBC: 5.04 x10E6/uL (ref 4.14–5.80)
RDW: 12.4 % (ref 11.6–15.4)
WBC: 8.9 10*3/uL (ref 3.4–10.8)

## 2021-10-02 ENCOUNTER — Ambulatory Visit (INDEPENDENT_AMBULATORY_CARE_PROVIDER_SITE_OTHER): Payer: Medicare Other

## 2021-10-02 VITALS — Wt 308.0 lb

## 2021-10-02 DIAGNOSIS — Z0000 Encounter for general adult medical examination without abnormal findings: Secondary | ICD-10-CM | POA: Diagnosis not present

## 2021-10-02 DIAGNOSIS — Z79899 Other long term (current) drug therapy: Secondary | ICD-10-CM

## 2021-10-02 DIAGNOSIS — Z599 Problem related to housing and economic circumstances, unspecified: Secondary | ICD-10-CM

## 2021-10-02 NOTE — Progress Notes (Signed)
Subjective:   Kurt Blevins is a 61 y.o. male who presents for Medicare Annual/Subsequent preventive examination.  Virtual Visit via Telephone Note  I connected with  CONN TROMBETTA on 10/02/21 at 11:15 AM EDT by telephone and verified that I am speaking with the correct person using two identifiers.  Location: Patient: Home Provider: [facility] Persons participating in the virtual visit: patient/Nurse Health Advisor   I discussed the limitations, risks, security and privacy concerns of performing an evaluation and management service by telephone and the availability of in person appointments. The patient expressed understanding and agreed to proceed.  Interactive audio and video telecommunications were attempted between this nurse and patient, however failed, due to patient having technical difficulties OR patient did not have access to video capability.  We continued and completed visit with audio only.  Some vital signs may be absent or patient reported.   Alexiana Laverdure E Grayland Daisey, LPN   Review of Systems     Cardiac Risk Factors include: advanced age (>56mn, >>78women);obesity (BMI >30kg/m2);sedentary lifestyle;male gender;diabetes mellitus;dyslipidemia;hypertension;Other (see comment), Risk factor comments: COPD     Objective:    Today's Vitals   10/02/21 1119 10/02/21 1120  Weight: (!) 308 lb (139.7 kg)   PainSc:  4    Body mass index is 42.96 kg/m.  Advanced Directives 10/02/2021 09/29/2020 09/29/2019 09/25/2018 02/25/2018  Does Patient Have a Medical Advance Directive? No No No No No  Would patient like information on creating a medical advance directive? No - Patient declined No - Patient declined No - Patient declined Yes (MAU/Ambulatory/Procedural Areas - Information given) No - Patient declined    Current Medications (verified) Outpatient Encounter Medications as of 10/02/2021  Medication Sig   aspirin 81 MG tablet Take 81 mg by mouth daily.   atorvastatin (LIPITOR) 40 MG tablet  Take 1 tablet (40 mg total) by mouth daily.   fenofibrate 160 MG tablet Take 1 tablet (160 mg total) by mouth daily.   Fluticasone-Umeclidin-Vilant (TRELEGY ELLIPTA) 100-62.5-25 MCG/ACT AEPB Inhale 1 puff into the lungs daily.   glipiZIDE (GLUCOTROL) 10 MG tablet Take 1 tablet (10 mg total) by mouth 2 (two) times daily before a meal.   glucose blood (ONETOUCH VERIO) test strip Check BS one daily Dx E11.42   ipratropium-albuterol (DUONEB) 0.5-2.5 (3) MG/3ML SOLN USE 3 MLS IN NEBULIZER AND INHALE INTO LUNGS 3 TIMES DAILY AS NEEDED FOR SHORTNESS OF BREATH.   lisinopril-hydrochlorothiazide (ZESTORETIC) 20-25 MG tablet Take 1 tablet by mouth daily.   metFORMIN (GLUCOPHAGE) 1000 MG tablet TAKE 1 TABLET TWICE DAILY WITH A MEAL.   ONETOUCH DELICA LANCETS 347WMISC Check blood sugar 1x per day and prn  E 11.9   No facility-administered encounter medications on file as of 10/02/2021.    Allergies (verified) Penicillins, Eggs or egg-derived products, and Peanut-containing drug products   History: Past Medical History:  Diagnosis Date   COPD (chronic obstructive pulmonary disease) ([place]    Hyperlipidemia    Hyperlipidemia 11/26/2012   Hypertension    Neuropathy    patient reported   Past Surgical History:  Procedure Laterality Date   BACK SURGERY  1990s   COLONOSCOPY N/A 02/25/2018   Procedure: COLONOSCOPY;  Surgeon: DDoran Stabler MD;  Location: [facility];  Service: Gastroenterology;  Laterality: N/A;   HERNIA REPAIR     NECK SURGERY     POLYPECTOMY  02/25/2018   Procedure: POLYPECTOMY;  Surgeon: DDoran Stabler MD;  Location: [facility];  Service: Gastroenterology;;  TONSILLECTOMY     Family History  Problem Relation Age of Onset   Diabetes Mother    Hypertension Mother    Hyperlipidemia Mother    Arthritis Mother    Osteoporosis Mother    Diabetes Father    Hypertension Father    Hyperlipidemia Father    Heart attack Father 3       Late 52s and 71s   Cancer Paternal  Grandmother        colon   Healthy Sister    Healthy Brother    Healthy Daughter    Healthy Son    Healthy Son    Healthy Son    Social History   Socioeconomic History   Marital status: Married    Spouse name: Shirlean Mylar   Number of children: 4   Years of education: Not on file   Highest education level: 11th grade  Occupational History   Occupation: Disabled    Comment: Back problems  Tobacco Use   Smoking status: Former    Packs/day: 1.00    Years: 10.00    Pack years: 10.00    Types: Cigarettes    Quit date: 11/26/2008    Years since quitting: 12.8   Smokeless tobacco: Never  Vaping Use   Vaping Use: Never used  Substance and Sexual Activity   Alcohol use: No   Drug use: No   Sexual activity: Not on file  Other Topics Concern   Not on file  Social History Narrative   Not on file   Social Determinants of Health   Financial Resource Strain: Low Risk    Difficulty of Paying Living Expenses: Not very hard  Food Insecurity: No Food Insecurity   Worried About Charity fundraiser in the Last Year: Never true   Ran Out of Food in the Last Year: Never true  Transportation Needs: No Transportation Needs   Lack of Transportation (Medical): No   Lack of Transportation (Non-Medical): No  Physical Activity: Insufficiently Active   Days of Exercise per Week: 7 days   Minutes of Exercise per Session: 10 min  Stress: No Stress Concern Present   Feeling of Stress : Only a little  Social Connections: Engineer, building services of Communication with Friends and Family: More than three times a week   Frequency of Social Gatherings with Friends and Family: More than three times a week   Attends Religious Services: More than 4 times per year   Active Member of [employer] or Organizations: Yes   Attends Music therapist: More than 4 times per year   Marital Status: Married    Tobacco Counseling Counseling given: Not Answered   Clinical Intake:  Pre-visit  preparation completed: Yes  Pain : 0-10 Pain Score: 4  Pain Type: Chronic pain Pain Location: Back Pain Orientation: Lower Pain Descriptors / Indicators: Aching, Sharp Pain Onset: More than a month ago Pain Frequency: Constant     BMI - recorded: 42.96 Nutritional Status: BMI > 30  Obese Nutritional Risks: None Diabetes: Yes CBG done?: No Did pt. bring in CBG monitor from home?: No  How often do you need to have someone help you when you read instructions, pamphlets, or other written materials from your doctor or pharmacy?: 1 - Never  Diabetic? Nutrition Risk Assessment:  Has the patient had any N/V/D within the last 2 months?  No  Does the patient have any non-healing wounds?  No  Has the patient had any unintentional weight  loss or weight gain?  No   Diabetes:  Is the patient diabetic?  Yes  If diabetic, was a CBG obtained today?  No  Did the patient bring in their glucometer from home?  No  How often do you monitor your CBG's? 2-3 times per day - 128 this am per pt.   Financial Strains and Diabetes Management:  Are you having any financial strains with the device, your supplies or your medication? Yes .  Does the patient want to be seen by Chronic Care Management for management of their diabetes?  Yes  Would the patient like to be referred to a Nutritionist or for Diabetic Management?  No   Diabetic Exams:  Diabetic Eye Exam:  Overdue for diabetic eye exam. Pt has been advised about the importance in completing this exam. Patient declines a referral at this time.  Diabetic Foot Exam: Completed 07/03/2021. Pt has been advised about the importance in completing this exam. Pt is scheduled for diabetic foot exam on next year.    Interpreter Needed?: No  Information entered by :: Mykhia Danish, LPN   Activities of Daily Living In your present state of health, do you have any difficulty performing the following activities: 10/02/2021  Hearing? Y  Comment declines  hearing aids  Vision? N  Difficulty concentrating or making decisions? N  Walking or climbing stairs? Y  Comment a little  Dressing or bathing? N  Doing errands, shopping? N  Preparing Food and eating ? N  Using the Toilet? N  In the past six months, have you accidently leaked urine? N  Do you have problems with loss of bowel control? N  Managing your Medications? N  Managing your Finances? N  Housekeeping or managing your Housekeeping? N  Some recent data might be hidden    Patient Care Team: Chevis Pretty, FNP as PCP - General (Nurse Practitioner) Doran Stabler, MD as Consulting Physician (Gastroenterology)  Indicate any recent Medical Services you may have received from other than [facility] providers in the past year (date may be approximate).     Assessment:   This is a routine wellness examination for Jabre.  Hearing/Vision screen Hearing Screening - Comments:: C/o moderate hearing difficulties - declines hearing aids Vision Screening - Comments:: Wears otc glasses prn only - behind on annual eye exams - no eye doctor at this time and declines referral  Dietary issues and exercise activities discussed: Current Exercise Habits: Home exercise routine, Type of exercise: walking, Time (Minutes): 10, Frequency (Times/Week): 7, Weekly Exercise (Minutes/Week): 70, Intensity: Mild, Exercise limited by: orthopedic condition(s);respiratory conditions(s)   Goals Addressed             This Visit's Progress    Patient Stated   On track    09/29/2020 AWV Goal: Improved Nutrition/Diet  Patient will verbalize understanding that diet plays an important role in overall health and that a poor diet is a risk factor for many chronic medical conditions.  Over the next year, patient will improve self management of their diet by incorporating fewer sweetened foods & beverages, increased physical activity, better food choices, and watch portion sizes/amount of food eaten at one  time. Patient will utilize available community resources to help with food acquisition if needed (ex: food pantries, [address], etc) Patient will work with nutrition specialist if a referral was made        Depression Screen PHQ 2/9 Scores 10/02/2021 09/29/2021 07/03/2021 03/02/2021 11/29/2020 09/29/2020 05/30/2020  PHQ - 2  Score 0 - 0 0 0 0 0  PHQ- 9 Score - - 0 0 - - -  Exception Documentation - Patient refusal - - - - -    Fall Risk Fall Risk  10/02/2021 09/29/2021 07/03/2021 03/30/2021 03/02/2021  Falls in the past year? 0 0 0 0 0  Comment - - - - -  Number falls in past yr: 0 0 - - -  Injury with Fall? 0 0 - - -  Comment - - - - -  Risk for fall due to : Impaired balance/gait;Orthopedic patient;Medication side effect - - - -  Follow up Falls prevention discussed;Education provided - - - -    FALL RISK PREVENTION PERTAINING TO THE HOME:  Any stairs in or around the home? No  If so, are there any without handrails? No  Home free of loose throw rugs in walkways, pet beds, electrical cords, etc? Yes  Adequate lighting in your home to reduce risk of falls? Yes   ASSISTIVE DEVICES UTILIZED TO PREVENT FALLS:  Life alert? No  Use of a cane, walker or w/c? Yes  Grab bars in the bathroom? Yes  Shower chair or bench in shower? No  Elevated toilet seat or a handicapped toilet? Yes   TIMED UP AND GO:  Was the test performed? No . Telephonic visit  Cognitive Function: MMSE - Mini Mental State Exam 09/25/2018 06/10/2017  Orientation to time 5 5  Orientation to Place 4 5  Registration 3 3  Attention/ Calculation 0 0  Attention/Calculation-comments Patient did not attempt not attempted  Recall 2 3  Language- name 2 objects 2 2  Language- repeat 1 1  Language- follow 3 step command 3 3  Language- read & follow direction 1 1  Write a sentence 0 0  Copy design 1 0  Total score 22 23     6CIT Screen 09/29/2020 09/29/2019 09/29/2019  What Year? 0 points 0 points 0 points  What month? 0  points 0 points 0 points  What time? 0 points 0 points 0 points  Count back from 20 0 points 0 points -  Months in reverse 0 points 0 points -  Repeat phrase 0 points 2 points -  Total Score 0 2 -    Immunizations Immunization History  Administered Date(s) Administered   PFIZER(Purple Top)SARS-COV-2 Vaccination 12/08/2019, 12/31/2019, 05/01/2021   Tdap 02/01/2014    TDAP status: Up to date  Flu Vaccine status: Declined, Education has been provided regarding the importance of this vaccine but patient still declined. Advised may receive this vaccine at local pharmacy or Health Dept. Aware to provide a copy of the vaccination record if obtained from local pharmacy or Health Dept. Verbalized acceptance and understanding.  Pneumococcal vaccine status: Due, Education has been provided regarding the importance of this vaccine. Advised may receive this vaccine at local pharmacy or Health Dept. Aware to provide a copy of the vaccination record if obtained from local pharmacy or Health Dept. Verbalized acceptance and understanding.  Covid-19 vaccine status: Completed vaccines  Qualifies for Shingles Vaccine? Yes   Zostavax completed No   Shingrix Completed?: No.    Education has been provided regarding the importance of this vaccine. Patient has been advised to call insurance company to determine out of pocket expense if they have not yet received this vaccine. Advised may also receive vaccine at local pharmacy or Health Dept. Verbalized acceptance and understanding.  Screening Tests Health Maintenance  Topic Date Due   Zoster  Vaccines- Shingrix (1 of 2) Never done   OPHTHALMOLOGY EXAM  04/09/2020   COVID-19 Vaccine (4 - Booster for Pfizer series) 06/26/2021   COLONOSCOPY (Pts 45-91yr Insurance coverage will need to be confirmed)  12/21/2021 (Originally 02/26/2019)   HIV Screening  07/03/2022 (Originally 10/16/1975)   HEMOGLOBIN A1C  04/01/2022   FOOT EXAM  07/03/2022   TETANUS/TDAP   02/02/2024   Hepatitis C Screening  Completed   HPV VACCINES  Aged Out    Health Maintenance  Health Maintenance Due  Topic Date Due   Zoster Vaccines- Shingrix (1 of 2) Never done   OPHTHALMOLOGY EXAM  04/09/2020   COVID-19 Vaccine (4 - Booster for Pfizer series) 06/26/2021    Colorectal cancer screening: Type of screening: Colonoscopy. Completed 02/25/2018. Repeat every 1 years patient declines at this time  Lung Cancer Screening: (Low Dose CT Chest recommended if Age 60-80years, 30 pack-year currently smoking OR have quit w/in 15years.) does not qualify.   Additional Screening:  Hepatitis C Screening: does qualify; Completed 08/02/2015  Vision Screening: Recommended annual ophthalmology exams for early detection of glaucoma and other disorders of the eye. Is the patient up to date with their annual eye exam?  No  Who is the provider or what is the name of the office in which the patient attends annual eye exams? none If pt is not established with a provider, would they like to be referred to a provider to establish care?  No, declines at this time - says his Insurance sends someone out annually .   Dental Screening: Recommended annual dental exams for proper oral hygiene  Community Resource Referral / Chronic Care Management: CRR required this visit?  No   CCM required this visit?  Yes      Plan:     I have personally reviewed and noted the following in the patients chart:   Medical and social history Use of alcohol, tobacco or illicit drugs  Current medications and supplements including opioid prescriptions. Patient is not currently taking opioid prescriptions. Functional ability and status Nutritional status Physical activity Advanced directives List of other physicians Hospitalizations, surgeries, and ER visits in previous 12 months Vitals Screenings to include cognitive, depression, and falls Referrals and appointments  In addition, I have reviewed and  discussed with patient certain preventive protocols, quality metrics, and best practice recommendations. A written personalized care plan for preventive services as well as general preventive health recommendations were provided to patient.     ASandrea Hammond LPN   [id number]  Nurse Notes: CCM referral as he has been prescribed several meds in the past that he just couldn't afford- he is usually given samples.

## 2021-10-02 NOTE — Patient Instructions (Signed)
Kurt Blevins , Thank you for taking time to come for your Medicare Wellness Visit. I appreciate your ongoing commitment to your health goals. Please review the following plan we discussed and let me know if I can assist you in the future.   Screening recommendations/referrals: Colonoscopy: Done 02/25/2018 - Repeat in 1 year **please call Dr Loletha Carrow, gastroenterologist and schedule this soon Recommended yearly ophthalmology/optometry visit for glaucoma screening and checkup Recommended yearly dental visit for hygiene and checkup  Vaccinations: Influenza vaccine: Declined Pneumococcal vaccine: Due - ask about Prevnar-20 Tdap vaccine: Done 02/01/2014 - Repeat in 10 years Shingles vaccine: Due - Shingrix is 2 doses 2-6 months apart and over 90% effective     Covid-19: Done 12/08/19, 12/31/19, 05/01/2021  Advanced directives: Advance directive discussed with you today. Even though you declined this today, please call our office should you change your mind, and we can give you the proper paperwork for you to fill out.   Conditions/risks identified: Aim for 30 minutes of exercise or brisk walking, 6-8 glasses of water, and 5 servings of fruits and vegetables each day.   Next appointment: Follow up in one year for your annual wellness visit   Preventive Care 40-64 Years, Male Preventive care refers to lifestyle choices and visits with your health care provider that can promote health and wellness. What does preventive care include? A yearly physical exam. This is also called an annual well check. Dental exams once or twice a year. Routine eye exams. Ask your health care provider how often you should have your eyes checked. Personal lifestyle choices, including: Daily care of your teeth and gums. Regular physical activity. Eating a healthy diet. Avoiding tobacco and drug use. Limiting alcohol use. Practicing safe sex. Taking low-dose aspirin every day starting at age 30. What happens during an annual  well check? The services and screenings done by your health care provider during your annual well check will depend on your age, overall health, lifestyle risk factors, and family history of disease. Counseling  Your health care provider may ask you questions about your: Alcohol use. Tobacco use. Drug use. Emotional well-being. Home and relationship well-being. Sexual activity. Eating habits. Work and work Statistician. Screening  You may have the following tests or measurements: Height, weight, and BMI. Blood pressure. Lipid and cholesterol levels. These may be checked every 5 years, or more frequently if you are over 35 years old. Skin check. Lung cancer screening. You may have this screening every year starting at age 48 if you have a 30-pack-year history of smoking and currently smoke or have quit within the past 15 years. Fecal occult blood test (FOBT) of the stool. You may have this test every year starting at age 69. Flexible sigmoidoscopy or colonoscopy. You may have a sigmoidoscopy every 5 years or a colonoscopy every 10 years starting at age 85. Prostate cancer screening. Recommendations will vary depending on your family history and other risks. Hepatitis C blood test. Hepatitis B blood test. Sexually transmitted disease (STD) testing. Diabetes screening. This is done by checking your blood sugar (glucose) after you have not eaten for a while (fasting). You may have this done every 1-3 years. Discuss your test results, treatment options, and if necessary, the need for more tests with your health care provider. Vaccines  Your health care provider may recommend certain vaccines, such as: Influenza vaccine. This is recommended every year. Tetanus, diphtheria, and acellular pertussis (Tdap, Td) vaccine. You may need a Td booster every 10 years.  Zoster vaccine. You may need this after age 45. Pneumococcal 13-valent conjugate (PCV13) vaccine. You may need this if you have certain  conditions and have not been vaccinated. Pneumococcal polysaccharide (PPSV23) vaccine. You may need one or two doses if you smoke cigarettes or if you have certain conditions. Talk to your health care provider about which screenings and vaccines you need and how often you need them. This information is not intended to replace advice given to you by your health care provider. Make sure you discuss any questions you have with your health care provider. Document Released: 08/05/2015 Document Revised: 03/28/2016 Document Reviewed: 05/10/2015 Elsevier Interactive Patient Education  2017 [place] Prevention in the Home Falls can cause injuries. They can happen to people of all ages. There are many things you can do to make your home safe and to help prevent falls. What can I do on the outside of my home? Regularly fix the edges of walkways and driveways and fix any cracks. Remove anything that might make you trip as you walk through a door, such as a raised step or threshold. Trim any bushes or trees on the path to your home. Use bright outdoor lighting. Clear any walking paths of anything that might make someone trip, such as rocks or tools. Regularly check to see if handrails are loose or broken. Make sure that both sides of any steps have handrails. Any raised decks and porches should have guardrails on the edges. Have any leaves, snow, or ice cleared regularly. Use sand or salt on walking paths during winter. Clean up any spills in your garage right away. This includes oil or grease spills. What can I do in the bathroom? Use night lights. Install grab bars by the toilet and in the tub and shower. Do not use towel bars as grab bars. Use non-skid mats or decals in the tub or shower. If you need to sit down in the shower, use a plastic, non-slip stool. Keep the floor dry. Clean up any water that spills on the floor as soon as it happens. Remove soap buildup in the tub or shower  regularly. Attach bath mats securely with double-sided non-slip rug tape. Do not have throw rugs and other things on the floor that can make you trip. What can I do in the bedroom? Use night lights. Make sure that you have a light by your bed that is easy to reach. Do not use any sheets or blankets that are too big for your bed. They should not hang down onto the floor. Have a firm chair that has side arms. You can use this for support while you get dressed. Do not have throw rugs and other things on the floor that can make you trip. What can I do in the kitchen? Clean up any spills right away. Avoid walking on wet floors. Keep items that you use a lot in easy-to-reach places. If you need to reach something above you, use a strong step stool that has a grab bar. Keep electrical cords out of the way. Do not use floor polish or wax that makes floors slippery. If you must use wax, use non-skid floor wax. Do not have throw rugs and other things on the floor that can make you trip. What can I do with my stairs? Do not leave any items on the stairs. Make sure that there are handrails on both sides of the stairs and use them. Fix handrails that are broken or loose.  Make sure that handrails are as long as the stairways. Check any carpeting to make sure that it is firmly attached to the stairs. Fix any carpet that is loose or worn. Avoid having throw rugs at the top or bottom of the stairs. If you do have throw rugs, attach them to the floor with carpet tape. Make sure that you have a light switch at the top of the stairs and the bottom of the stairs. If you do not have them, ask someone to add them for you. What else can I do to help prevent falls? Wear shoes that: Do not have high heels. Have rubber bottoms. Are comfortable and fit you well. Are closed at the toe. Do not wear sandals. If you use a stepladder: Make sure that it is fully opened. Do not climb a closed stepladder. Make sure that  both sides of the stepladder are locked into place. Ask someone to hold it for you, if possible. Clearly mark and make sure that you can see: Any grab bars or handrails. First and last steps. Where the edge of each step is. Use tools that help you move around (mobility aids) if they are needed. These include: Canes. Walkers. Scooters. Crutches. Turn on the lights when you go into a dark area. Replace any light bulbs as soon as they burn out. Set up your furniture so you have a clear path. Avoid moving your furniture around. If any of your floors are uneven, fix them. If there are any pets around you, be aware of where they are. Review your medicines with your doctor. Some medicines can make you feel dizzy. This can increase your chance of falling. Ask your doctor what other things that you can do to help prevent falls. This information is not intended to replace advice given to you by your health care provider. Make sure you discuss any questions you have with your health care provider. Document Released: 05/05/2009 Document Revised: 12/15/2015 Document Reviewed: 08/13/2014 Elsevier Interactive Patient Education  2017 [employer].

## 2021-10-10 ENCOUNTER — Telehealth: Payer: Self-pay

## 2021-10-10 NOTE — Chronic Care Management (AMB) (Signed)
?  Chronic Care Management  ? ?Outreach Note ? ?10/10/2021 ?Name: Kurt Blevins MRN: [id number] DOB: 24-Jun-1961 ? ?Kurt Blevins is a 61 y.o. year old male who is a primary care patient of Chevis Pretty, [place]. I reached out to [name] by phone today in response to a referral sent by [name] primary care provider. ? ?An unsuccessful telephone outreach was attempted today. The patient was referred to the case management team for assistance with care management and care coordination.  ? ?Follow Up Plan: A HIPAA compliant phone message was left for the patient providing contact information and requesting a return call.  ?The care management team will reach out to the patient again over the next 5 days.  ?If patient returns call to provider office, please advise to call [facility] * at [phone]* ? ?Noreene Larsson, RMA ?Care Guide, Embedded Care Coordination ?[facility]  ?[place], [place] [address] ?Direct Dial: [phone] ?[email or web address] ?Website: [email or web address]  ? ?

## 2021-10-25 NOTE — Chronic Care Management (AMB) (Signed)
?  Care Management  ? ?Note ? ?10/25/2021 ?Name: BENJAMINE STROUT MRN: [id number] DOB: [birth year] ? ?JW COVIN is a 61 y.o. year old male who is a primary care patient of Chevis Pretty, FNP. I reached out to [name] by phone today offer care coordination services.  ? ?Mr. Standre was given information about care management services today including:  ?Care management services include personalized support from designated clinical staff supervised by his physician, including individualized plan of care and coordination with other care providers ?24/7 contact phone numbers for assistance for urgent and routine care needs. ?The patient may stop care management services at any time by phone call to the office staff. ? ?Patient did not agree to enrollment in care management services and does not wish to consider at this time. ? ?Follow up plan: ?Patient declines further follow up and engagement by the care management team. Appropriate care team members and provider have been notified via electronic communication.  ? ?Noreene Larsson, RMA ?Care Guide, Embedded Care Coordination ?[facility]  [facility]  ?[place], [place] [address] ?Direct Dial: [phone] ?Museum/gallery [email or web address] ?Website: [email or web address]  ? ?

## 2022-01-04 ENCOUNTER — Encounter: Payer: Self-pay | Admitting: Nurse Practitioner

## 2022-01-04 ENCOUNTER — Ambulatory Visit (INDEPENDENT_AMBULATORY_CARE_PROVIDER_SITE_OTHER): Payer: Medicare Other | Admitting: Nurse Practitioner

## 2022-01-04 VITALS — BP 139/79 | HR 72 | Temp 97.2°F | Resp 20 | Ht 71.0 in | Wt 307.0 lb

## 2022-01-04 DIAGNOSIS — I10 Essential (primary) hypertension: Secondary | ICD-10-CM

## 2022-01-04 DIAGNOSIS — E782 Mixed hyperlipidemia: Secondary | ICD-10-CM | POA: Diagnosis not present

## 2022-01-04 DIAGNOSIS — E1140 Type 2 diabetes mellitus with diabetic neuropathy, unspecified: Secondary | ICD-10-CM

## 2022-01-04 DIAGNOSIS — J449 Chronic obstructive pulmonary disease, unspecified: Secondary | ICD-10-CM | POA: Diagnosis not present

## 2022-01-04 DIAGNOSIS — E1142 Type 2 diabetes mellitus with diabetic polyneuropathy: Secondary | ICD-10-CM

## 2022-01-04 DIAGNOSIS — J4489 Other specified chronic obstructive pulmonary disease: Secondary | ICD-10-CM

## 2022-01-04 LAB — BAYER DCA HB A1C WAIVED: HB A1C (BAYER DCA - WAIVED): 8.6 % — ABNORMAL HIGH (ref 4.8–5.6)

## 2022-01-04 MED ORDER — FENOFIBRATE 160 MG PO TABS: 160.0000 mg | ORAL_TABLET | Freq: Every day | ORAL | 1 refills | Status: DC

## 2022-01-04 MED ORDER — METFORMIN HCL 1000 MG PO TABS: ORAL_TABLET | ORAL | 1 refills | Status: DC

## 2022-01-04 MED ORDER — LISINOPRIL-HYDROCHLOROTHIAZIDE 20-25 MG PO TABS: 1.0000 | ORAL_TABLET | Freq: Every day | ORAL | 1 refills | Status: DC

## 2022-01-04 MED ORDER — GLIPIZIDE 10 MG PO TABS: 10.0000 mg | ORAL_TABLET | Freq: Two times a day (BID) | ORAL | 1 refills | Status: DC

## 2022-01-04 MED ORDER — ATORVASTATIN CALCIUM 40 MG PO TABS
40.0000 mg | ORAL_TABLET | Freq: Every day | ORAL | 1 refills | Status: DC
Start: 2022-01-04 — End: 2022-04-06

## 2022-01-04 NOTE — Addendum Note (Signed)
Addended by: Chevis Pretty on: 01/04/2022 02:22 PM   Modules accepted: Orders

## 2022-01-04 NOTE — Patient Instructions (Signed)
Diabetes Mellitus and Foot Care Foot care is an important part of your health, especially when you have diabetes. Diabetes may cause you to have problems because of poor blood flow (circulation) to your feet and legs, which can cause your skin to: Become thinner and drier. Break more easily. Heal more slowly. Peel and crack. You may also have nerve damage (neuropathy) in your legs and feet, causing decreased feeling in them. This means that you may not notice minor injuries to your feet that could lead to more serious problems. Noticing and addressing any potential problems early is the best way to prevent future foot problems. How to care for your feet Foot hygiene  Wash your feet daily with warm water and mild soap. Do not use hot water. Then, pat your feet and the areas between your toes until they are completely dry. Do not soak your feet as this can dry your skin. Trim your toenails straight across. Do not dig under them or around the cuticle. File the edges of your nails with an emery board or nail file. Apply a moisturizing lotion or petroleum jelly to the skin on your feet and to dry, brittle toenails. Use lotion that does not contain alcohol and is unscented. Do not apply lotion between your toes. Shoes and socks Wear clean socks or stockings every day. Make sure they are not too tight. Do not wear knee-high stockings since they may decrease blood flow to your legs. Wear shoes that fit properly and have enough cushioning. Always look in your shoes before you put them on to be sure there are no objects inside. To break in new shoes, wear them for just a few hours a day. This prevents injuries on your feet. Wounds, scrapes, corns, and calluses  Check your feet daily for blisters, cuts, bruises, sores, and redness. If you cannot see the bottom of your feet, use a mirror or ask someone for help. Do not cut corns or calluses or try to remove them with medicine. If you find a minor scrape,  cut, or break in the skin on your feet, keep it and the skin around it clean and dry. You may clean these areas with mild soap and water. Do not clean the area with peroxide, alcohol, or iodine. If you have a wound, scrape, corn, or callus on your foot, look at it several times a day to make sure it is healing and not infected. Check for: Redness, swelling, or pain. Fluid or blood. Warmth. Pus or a bad smell. General tips Do not cross your legs. This may decrease blood flow to your feet. Do not use heating pads or hot water bottles on your feet. They may burn your skin. If you have lost feeling in your feet or legs, you may not know this is happening until it is too late. Protect your feet from hot and cold by wearing shoes, such as at the beach or on hot pavement. Schedule a complete foot exam at least once a year (annually) or more often if you have foot problems. Report any cuts, sores, or bruises to your health care provider immediately. Where to find more information American Diabetes Association: www.diabetes.org Association of Diabetes Care & Education Specialists: www.diabeteseducator.org Contact a health care provider if: You have a medical condition that increases your risk of infection and you have any cuts, sores, or bruises on your feet. You have an injury that is not healing. You have redness on your legs or feet. You   feel burning or tingling in your legs or feet. You have pain or cramps in your legs and feet. Your legs or feet are numb. Your feet always feel cold. You have pain around any toenails. Get help right away if: You have a wound, scrape, corn, or callus on your foot and: You have pain, swelling, or redness that gets worse. You have fluid or blood coming from the wound, scrape, corn, or callus. Your wound, scrape, corn, or callus feels warm to the touch. You have pus or a bad smell coming from the wound, scrape, corn, or callus. You have a fever. You have a red  line going up your leg. Summary Check your feet every day for blisters, cuts, bruises, sores, and redness. Apply a moisturizing lotion or petroleum jelly to the skin on your feet and to dry, brittle toenails. Wear shoes that fit properly and have enough cushioning. If you have foot problems, report any cuts, sores, or bruises to your health care provider immediately. Schedule a complete foot exam at least once a year (annually) or more often if you have foot problems. This information is not intended to replace advice given to you by your health care provider. Make sure you discuss any questions you have with your health care provider. Document Revised: 01/28/2020 Document Reviewed: 01/28/2020 Elsevier Patient Education  2023 Elsevier Inc.  

## 2022-01-04 NOTE — Progress Notes (Signed)
Subjective:    Patient ID: Kurt Blevins, male    DOB: 08-29-1960, 61 y.o.   MRN: [id number]   Chief Complaint: No chief complaint on file.    HPI:  Kurt Blevins is a 61 y.o. who identifies as a male who was assigned male at birth.   Social history: Lives with: wife Work history: disability   Comes in today for follow up of the following chronic medical issues:  1. Primary hypertension No c/o chest pain, sob or headache. Does not check blood pressure athome. BP Readings from Last 3 Encounters:  09/29/21 134/74  07/03/21 (!) 142/80  03/30/21 (!) 152/91     2. Mixed hyperlipidemia Doe snot really watch diet and does no dedicated exercise. Lab Results  Component Value Date   CHOL 130 09/29/2021   HDL 30 (L) 09/29/2021   LDLCALC 39 09/29/2021   TRIG 423 (H) 09/29/2021   CHOLHDL 4.3 09/29/2021     3. Type 2 diabetes mellitus with diabetic polyneuropathy, without long-term current use of insulin (HCC) Blood sugars are running around 120-160.. he doe snot check them on a regular basis. Lab Results  Component Value Date   HGBA1C 7.9 (H) 09/29/2021    4. Neuropathy due to type 2 diabetes mellitus (HCC) Constant burning and numbness of bil feet  5. COPD with asthma ([place]) Is on trelegy inhaler daily. Has chronic cough  6. Severe obesity (BMI >= 40) (HCC) No recent weight changes Wt Readings from Last 3 Encounters:  01/04/22 (!) 307 lb (139.3 kg)  10/02/21 (!) 308 lb (139.7 kg)  09/29/21 (!) 308 lb 3.2 oz (139.8 kg)   BMI Readings from Last 3 Encounters:  01/04/22 42.82 kg/m  10/02/21 42.96 kg/m  09/29/21 42.99 kg/m     New complaints: None today  Allergies  Allergen Reactions   Penicillins Anaphylaxis and Other (See Comments)    [place]helps Has patient had a PCN reaction causing immediate rash, facial/tongue/throat swelling, SOB or lightheadedness with hypotension: Yes Has patient had a PCN reaction causing severe rash involving mucus membranes or  skin necrosis: Yes Has patient had a PCN reaction that required hospitalization: Yes Has patient had a PCN reaction occurring within the last 10 years: No If all of the above answers are "NO", then may proceed with Cephalosporin use.    Eggs Or Egg-Derived Products Nausea Only and Other (See Comments)    Stomach cramps   Peanut-Containing Drug Products Hives and Itching   Outpatient Encounter Medications as of 01/04/2022  Medication Sig   aspirin 81 MG tablet Take 81 mg by mouth daily.   atorvastatin (LIPITOR) 40 MG tablet Take 1 tablet (40 mg total) by mouth daily.   fenofibrate 160 MG tablet Take 1 tablet (160 mg total) by mouth daily.   Fluticasone-Umeclidin-Vilant (TRELEGY ELLIPTA) 100-62.5-25 MCG/ACT AEPB Inhale 1 puff into the lungs daily.   glipiZIDE (GLUCOTROL) 10 MG tablet Take 1 tablet (10 mg total) by mouth 2 (two) times daily before a meal.   glucose blood (ONETOUCH VERIO) test strip Check BS one daily Dx E11.42   ipratropium-albuterol (DUONEB) 0.5-2.5 (3) MG/3ML SOLN USE 3 MLS IN NEBULIZER AND INHALE INTO LUNGS 3 TIMES DAILY AS NEEDED FOR SHORTNESS OF BREATH.   lisinopril-hydrochlorothiazide (ZESTORETIC) 20-25 MG tablet Take 1 tablet by mouth daily.   metFORMIN (GLUCOPHAGE) 1000 MG tablet TAKE 1 TABLET TWICE DAILY WITH A MEAL.   ONETOUCH DELICA LANCETS 62I MISC Check blood sugar 1x per day and prn  E  11.9   No facility-administered encounter medications on file as of 01/04/2022.    Past Surgical History:  Procedure Laterality Date   BACK SURGERY  1990s   COLONOSCOPY N/A 02/25/2018   Procedure: COLONOSCOPY;  Surgeon: Doran Stabler, MD;  Location: [facility];  Service: Gastroenterology;  Laterality: N/A;   HERNIA REPAIR     NECK SURGERY     POLYPECTOMY  02/25/2018   Procedure: POLYPECTOMY;  Surgeon: Doran Stabler, MD;  Location: [facility];  Service: Gastroenterology;;   TONSILLECTOMY      Family History  Problem Relation Age of Onset   Diabetes Mother     Hypertension Mother    Hyperlipidemia Mother    Arthritis Mother    Osteoporosis Mother    Diabetes Father    Hypertension Father    Hyperlipidemia Father    Heart attack Father 48       Late 76s and 74s   Cancer Paternal Grandmother        colon   Healthy Sister    Healthy Brother    Healthy Daughter    Healthy Son    Healthy Son    Healthy Son       Controlled substance contract: n/a     Review of Systems  Constitutional:  Negative for diaphoresis.  Eyes:  Negative for pain.  Respiratory:  Negative for shortness of breath.   Cardiovascular:  Negative for chest pain, palpitations and leg swelling.  Gastrointestinal:  Negative for abdominal pain.  Endocrine: Negative for polydipsia.  Skin:  Negative for rash.  Neurological:  Negative for dizziness, weakness and headaches.  Hematological:  Does not bruise/bleed easily.  All other systems reviewed and are negative.      Objective:   Physical Exam Vitals and nursing note reviewed.  Constitutional:      Appearance: Normal appearance. He is well-developed.  HENT:     Head: Normocephalic.     Nose: Nose normal.     Mouth/Throat:     Mouth: Mucous membranes are moist.     Pharynx: Oropharynx is clear.  Eyes:     Pupils: Pupils are equal, round, and reactive to light.  Neck:     Thyroid: No thyroid mass or thyromegaly.     Vascular: No carotid bruit or JVD.     Trachea: Phonation normal.  Cardiovascular:     Rate and Rhythm: Normal rate and regular rhythm.  Pulmonary:     Effort: Pulmonary effort is normal. No respiratory distress.     Breath sounds: Normal breath sounds.  Abdominal:     General: Bowel sounds are normal.     Palpations: Abdomen is soft.     Tenderness: There is no abdominal tenderness.  Musculoskeletal:        General: Normal range of motion.     Cervical back: Normal range of motion and neck supple.  Lymphadenopathy:     Cervical: No cervical adenopathy.  Skin:    General: Skin is warm  and dry.  Neurological:     Mental Status: He is alert and oriented to person, place, and time.  Psychiatric:        Behavior: Behavior normal.        Thought Content: Thought content normal.        Judgment: Judgment normal.    BP 139/79   Pulse 72   Temp (!) 97.2 F (36.2 C) (Temporal)   Resp 20   Ht _0  (1.803 m)  Wt (!) 307 lb (139.3 kg)   SpO2 92%   BMI 42.82 kg/m         Assessment & Plan:  Kurt Blevins comes in today with chief complaint of Medical Management of Chronic Issues   Diagnosis and orders addressed:  1. Primary hypertension Low sodium diet - CBC with Differential/Platelet - CMP14+EGFR - lisinopril-hydrochlorothiazide (ZESTORETIC) 20-25 MG tablet; Take 1 tablet by mouth daily.  Dispense: 90 tablet; Refill: 1  2. Mixed hyperlipidemia Low fat diet - Lipid panel - atorvastatin (LIPITOR) 40 MG tablet; Take 1 tablet (40 mg total) by mouth daily.  Dispense: 90 tablet; Refill: 1 - fenofibrate 160 MG tablet; Take 1 tablet (160 mg total) by mouth daily.  Dispense: 90 tablet; Refill: 1  3. Type 2 diabetes mellitus with diabetic polyneuropathy, without long-term current use of insulin (HCC) Continue to wtahc carbs in diet - glipiZIDE (GLUCOTROL) 10 MG tablet; Take 1 tablet (10 mg total) by mouth 2 (two) times daily before a meal.  Dispense: 180 tablet; Refill: 1 - metFORMIN (GLUCOPHAGE) 1000 MG tablet; TAKE 1 TABLET TWICE DAILY WITH A MEAL.  Dispense: 180 tablet; Refill: 1  4. Neuropathy due to type 2 diabetes mellitus ([employer]) Do not go bare footed Check feet daily  5. COPD with asthma ([place])  6. Severe obesity (BMI >= 40) (HCC) Discussed diet and exercise for person with BMI >25 Will recheck weight in 3-6 months    Labs pending Health Maintenance reviewed Diet and exercise encouraged  Follow up plan: 3 months   Mary-Margaret Hassell Done, FNP

## 2022-01-05 LAB — CBC WITH DIFFERENTIAL/PLATELET
Basophils Absolute: 0.1 10*3/uL (ref 0.0–0.2)
Basos: 1 %
EOS (ABSOLUTE): 0.4 10*3/uL (ref 0.0–0.4)
Eos: 5 %
Hematocrit: 42.1 % (ref 37.5–51.0)
Hemoglobin: 14.7 g/dL (ref 13.0–17.7)
Immature Grans (Abs): 0 10*3/uL (ref 0.0–0.1)
Immature Granulocytes: 0 %
Lymphocytes Absolute: 2.8 10*3/uL (ref 0.7–3.1)
Lymphs: 32 %
MCH: 29.8 pg (ref 26.6–33.0)
MCHC: 34.9 g/dL (ref 31.5–35.7)
MCV: 85 fL (ref 79–97)
Monocytes Absolute: 0.6 10*3/uL (ref 0.1–0.9)
Monocytes: 7 %
Neutrophils Absolute: 4.8 10*3/uL (ref 1.4–7.0)
Neutrophils: 55 %
Platelets: 267 10*3/uL (ref 150–450)
RBC: 4.93 x10E6/uL (ref 4.14–5.80)
RDW: 12.9 % (ref 11.6–15.4)
WBC: 8.7 10*3/uL (ref 3.4–10.8)

## 2022-01-05 LAB — CMP14+EGFR
ALT: 28 IU/L (ref 0–44)
AST: 23 IU/L (ref 0–40)
Albumin/Globulin Ratio: 2.1 (ref 1.2–2.2)
Albumin: 4.5 g/dL (ref 3.8–4.9)
Alkaline Phosphatase: 65 IU/L (ref 44–121)
BUN/Creatinine Ratio: 15 (ref 10–24)
BUN: 16 mg/dL (ref 8–27)
Bilirubin Total: 0.4 mg/dL (ref 0.0–1.2)
CO2: 22 mmol/L (ref 20–29)
Calcium: 9.9 mg/dL (ref 8.6–10.2)
Chloride: 101 mmol/L (ref 96–106)
Creatinine, Ser: 1.05 mg/dL (ref 0.76–1.27)
Globulin, Total: 2.1 g/dL (ref 1.5–4.5)
Glucose: 100 mg/dL — ABNORMAL HIGH (ref 70–99)
Potassium: 4.3 mmol/L (ref 3.5–5.2)
Sodium: 142 mmol/L (ref 134–144)
Total Protein: 6.6 g/dL (ref 6.0–8.5)
eGFR: 81 mL/min/{1.73_m2} (ref >59)

## 2022-01-05 LAB — LIPID PANEL
Chol/HDL Ratio: 3.6 ratio (ref 0.0–5.0)
Cholesterol, Total: 121 mg/dL (ref 100–199)
HDL: 34 mg/dL — ABNORMAL LOW (ref >39)
LDL Chol Calc (NIH): 40 mg/dL (ref 0–99)
Triglycerides: 314 mg/dL — ABNORMAL HIGH (ref 0–149)
VLDL Cholesterol Cal: 47 mg/dL — ABNORMAL HIGH (ref 5–40)

## 2022-01-09 ENCOUNTER — Encounter: Payer: Self-pay | Admitting: Emergency Medicine

## 2022-04-06 ENCOUNTER — Encounter: Payer: Self-pay | Admitting: Nurse Practitioner

## 2022-04-06 ENCOUNTER — Ambulatory Visit (INDEPENDENT_AMBULATORY_CARE_PROVIDER_SITE_OTHER): Payer: Medicare Other | Admitting: Nurse Practitioner

## 2022-04-06 VITALS — BP 147/86 | HR 82 | Temp 98.2°F | Resp 20 | Ht 71.0 in | Wt 289.0 lb

## 2022-04-06 DIAGNOSIS — E1140 Type 2 diabetes mellitus with diabetic neuropathy, unspecified: Secondary | ICD-10-CM | POA: Diagnosis not present

## 2022-04-06 DIAGNOSIS — E1142 Type 2 diabetes mellitus with diabetic polyneuropathy: Secondary | ICD-10-CM

## 2022-04-06 DIAGNOSIS — I10 Essential (primary) hypertension: Secondary | ICD-10-CM | POA: Diagnosis not present

## 2022-04-06 DIAGNOSIS — E782 Mixed hyperlipidemia: Secondary | ICD-10-CM

## 2022-04-06 DIAGNOSIS — J449 Chronic obstructive pulmonary disease, unspecified: Secondary | ICD-10-CM

## 2022-04-06 LAB — BAYER DCA HB A1C WAIVED: HB A1C (BAYER DCA - WAIVED): 6.4 % — ABNORMAL HIGH (ref 4.8–5.6)

## 2022-04-06 MED ORDER — ATORVASTATIN CALCIUM 40 MG PO TABS: 40.0000 mg | ORAL_TABLET | Freq: Every day | ORAL | 1 refills | Status: DC

## 2022-04-06 MED ORDER — LISINOPRIL-HYDROCHLOROTHIAZIDE 20-25 MG PO TABS: 1.0000 | ORAL_TABLET | Freq: Every day | ORAL | 1 refills | Status: DC

## 2022-04-06 MED ORDER — METFORMIN HCL 1000 MG PO TABS: ORAL_TABLET | ORAL | 1 refills | Status: DC

## 2022-04-06 MED ORDER — FENOFIBRATE 160 MG PO TABS: 160.0000 mg | ORAL_TABLET | Freq: Every day | ORAL | 1 refills | Status: DC

## 2022-04-06 MED ORDER — GLIPIZIDE 10 MG PO TABS: 10.0000 mg | ORAL_TABLET | Freq: Two times a day (BID) | ORAL | 1 refills | Status: DC

## 2022-04-06 NOTE — Progress Notes (Signed)
Subjective:    Patient ID: Kurt Blevins, male    DOB: [birth year], 61 y.o.   MRN: [id number]   Chief Complaint: Medical Management of Chronic Issues    HPI:  Kurt Blevins is a 61 y.o. who identifies as a male who was assigned male at birth.   Social history: Lives with: wife Work history: disability   Comes in today for follow up of the following chronic medical issues:  1. Primary hypertension No c/o chest pain or headaches. Does not check blood pressure at home. BP Readings from Last 3 Encounters:  01/04/22 139/79  09/29/21 134/74  07/03/21 (!) 142/80     2. Mixed hyperlipidemia Does not watch diet and does no dedicated exercise. Lab Results  Component Value Date   CHOL 121 01/04/2022   HDL 34 (L) 01/04/2022   LDLCALC 40 01/04/2022   TRIG 314 (H) 01/04/2022   CHOLHDL 3.6 01/04/2022     3. Type 2 diabetes mellitus with diabetic polyneuropathy, without long-term current use of insulin (HCC) Fasting blood sugars have been running 90-140. He denies any low blood sugars. We made no medication changes at last visit. He wanted  to try watching diet. Lab Results  Component Value Date   HGBA1C 8.6 (H) 01/04/2022     4. Neuropathy due to type 2 diabetes mellitus (HCC) Burning of bil feet.  5. COPD with asthma ([place]) Is on trelegy and duoneb. Unable to use inhaler because he had his top teeth pulled.  6. Severe obesity (BMI >= 40) (HCC) No recent weight changes Wt Readings from Last 3 Encounters:  04/06/22 289 lb (131.1 kg)  01/04/22 (!) 307 lb (139.3 kg)  10/02/21 (!) 308 lb (139.7 kg)   BMI Readings from Last 3 Encounters:  04/06/22 40.31 kg/m  01/04/22 42.82 kg/m  10/02/21 42.96 kg/m     New complaints: None today  Allergies  Allergen Reactions   Penicillins Anaphylaxis and Other (See Comments)    Whelps Has patient had a PCN reaction causing immediate rash, facial/tongue/throat swelling, SOB or lightheadedness with hypotension: Yes Has  patient had a PCN reaction causing severe rash involving mucus membranes or skin necrosis: Yes Has patient had a PCN reaction that required hospitalization: Yes Has patient had a PCN reaction occurring within the last 10 years: No If all of the above answers are "NO", then may proceed with Cephalosporin use.    Eggs Or Egg-Derived Products Nausea Only and Other (See Comments)    Stomach cramps   Peanut-Containing Drug Products Hives and Itching   Outpatient Encounter Medications as of 04/06/2022  Medication Sig   aspirin 81 MG tablet Take 81 mg by mouth daily.   atorvastatin (LIPITOR) 40 MG tablet Take 1 tablet (40 mg total) by mouth daily.   fenofibrate 160 MG tablet Take 1 tablet (160 mg total) by mouth daily.   Fluticasone-Umeclidin-Vilant (TRELEGY ELLIPTA) 100-62.5-25 MCG/ACT AEPB Inhale 1 puff into the lungs daily.   glipiZIDE (GLUCOTROL) 10 MG tablet Take 1 tablet (10 mg total) by mouth 2 (two) times daily before a meal.   glucose blood (ONETOUCH VERIO) test strip Check BS one daily Dx E11.42   ipratropium-albuterol (DUONEB) 0.5-2.5 (3) MG/3ML SOLN USE 3 MLS IN NEBULIZER AND INHALE INTO LUNGS 3 TIMES DAILY AS NEEDED FOR SHORTNESS OF BREATH.   lisinopril-hydrochlorothiazide (ZESTORETIC) 20-25 MG tablet Take 1 tablet by mouth daily.   metFORMIN (GLUCOPHAGE) 1000 MG tablet TAKE 1 TABLET TWICE DAILY WITH A MEAL.   ONETOUCH  DELICA LANCETS 47W MISC Check blood sugar 1x per day and prn  E 11.9   No facility-administered encounter medications on file as of 04/06/2022.    Past Surgical History:  Procedure Laterality Date   BACK SURGERY  1990s   COLONOSCOPY N/A 02/25/2018   Procedure: COLONOSCOPY;  Surgeon: Doran Stabler, MD;  Location: [facility];  Service: Gastroenterology;  Laterality: N/A;   HERNIA REPAIR     NECK SURGERY     POLYPECTOMY  02/25/2018   Procedure: POLYPECTOMY;  Surgeon: Doran Stabler, MD;  Location: [facility];  Service: Gastroenterology;;   TONSILLECTOMY       Family History  Problem Relation Age of Onset   Diabetes Mother    Hypertension Mother    Hyperlipidemia Mother    Arthritis Mother    Osteoporosis Mother    Diabetes Father    Hypertension Father    Hyperlipidemia Father    Heart attack Father 68       Late 11s and 25s   Cancer Paternal Grandmother        colon   Healthy Sister    Healthy Brother    Healthy Daughter    Healthy Son    Healthy Son    Healthy Son       Controlled substance contract: n/a     Review of Systems  Constitutional:  Negative for diaphoresis.  Eyes:  Negative for pain.  Respiratory:  Positive for shortness of breath.   Cardiovascular:  Negative for chest pain, palpitations and leg swelling.  Gastrointestinal:  Negative for abdominal pain.  Endocrine: Negative for polydipsia.  Skin:  Negative for rash.  Neurological:  Negative for dizziness, weakness and headaches.  Hematological:  Does not bruise/bleed easily.  All other systems reviewed and are negative.      Objective:   Physical Exam Vitals and nursing note reviewed.  Constitutional:      Appearance: Normal appearance. He is well-developed.  HENT:     Head: Normocephalic.     Nose: Nose normal.     Mouth/Throat:     Mouth: Mucous membranes are moist.     Pharynx: Oropharynx is clear.  Eyes:     Pupils: Pupils are equal, round, and reactive to light.  Neck:     Thyroid: No thyroid mass or thyromegaly.     Vascular: No carotid bruit or JVD.     Trachea: Phonation normal.  Cardiovascular:     Rate and Rhythm: Normal rate and regular rhythm.  Pulmonary:     Effort: Pulmonary effort is normal. No respiratory distress.     Breath sounds: Wheezing (insp and exp) present.  Abdominal:     General: Bowel sounds are normal.     Palpations: Abdomen is soft.     Tenderness: There is no abdominal tenderness.  Musculoskeletal:        General: Normal range of motion.     Cervical back: Normal range of motion and neck supple.   Lymphadenopathy:     Cervical: No cervical adenopathy.  Skin:    General: Skin is warm and dry.  Neurological:     Mental Status: He is alert and oriented to person, place, and time.  Psychiatric:        Behavior: Behavior normal.        Thought Content: Thought content normal.        Judgment: Judgment normal.     BP (!) 147/86   Pulse 82  Temp 98.2 F (36.8 C) (Temporal)   Resp 20   Ht '5\' 11"'  (1.803 m)   Wt 289 lb (131.1 kg)   SpO2 92%   BMI 40.31 kg/m   Hgba1c 6.4%      Assessment & Plan:  Kurt Blevins comes in today with chief complaint of Medical Management of Chronic Issues   Diagnosis and orders addressed:  1. Primary hypertension Low sodium diet - CBC with Differential/Platelet - CMP14+EGFR - lisinopril-hydrochlorothiazide (ZESTORETIC) 20-25 MG tablet; Take 1 tablet by mouth daily.  Dispense: 90 tablet; Refill: 1  2. Mixed hyperlipidemia Low fta diet - Lipid panel - atorvastatin (LIPITOR) 40 MG tablet; Take 1 tablet (40 mg total) by mouth daily.  Dispense: 90 tablet; Refill: 1 - fenofibrate 160 MG tablet; Take 1 tablet (160 mg total) by mouth daily.  Dispense: 90 tablet; Refill: 1  3. Type 2 diabetes mellitus with diabetic polyneuropathy, without long-term current use of insulin (HCC) Continue to wtahc carbsin diet - Bayer DCA Hb A1c Waived - Microalbumin / creatinine urine ratio - glipiZIDE (GLUCOTROL) 10 MG tablet; Take 1 tablet (10 mg total) by mouth 2 (two) times daily before a meal.  Dispense: 180 tablet; Refill: 1 - metFORMIN (GLUCOPHAGE) 1000 MG tablet; TAKE 1 TABLET TWICE DAILY WITH A MEAL.  Dispense: 180 tablet; Refill: 1  4. Neuropathy due to type 2 diabetes mellitus ([place]) Do not go barefooted  5. COPD with asthma ([place]) Keep follow up with pulmonology  6. Severe obesity (BMI >= 40) (HCC) Discussed diet and exercise for person with BMI >25 Will recheck weight in 3-6 months    Labs pending Health Maintenance reviewed Diet and  exercise encouraged  Follow up plan: 3 months   Mary-Margaret Hassell Done, FNP

## 2022-04-06 NOTE — Patient Instructions (Signed)
Chronic Obstructive Pulmonary Disease  Chronic obstructive pulmonary disease (COPD) is a long-term (chronic) lung problem. When you have COPD, it is hard for air to get in and out of your lungs. Usually the condition gets worse over time, and your lungs will never return to normal. There are things you can do to keep yourself as healthy as possible. What are the causes? Smoking. This is the most common cause. Certain genes passed from parent to child (inherited). What increases the risk? Being exposed to secondhand smoke from cigarettes, pipes, or cigars. Being exposed to chemicals and other irritants, such as fumes and dust in the work environment. Having chronic lung conditions or infections. What are the signs or symptoms? Shortness of breath, especially during physical activity. A long-term cough with a large amount of thick mucus. Sometimes, the cough may not have any mucus (dry cough). Wheezing. Breathing quickly. Skin that looks gray or blue, especially in the fingers, toes, or lips. Feeling tired (fatigue). Weight loss. Chest tightness. Having infections often. Episodes when breathing symptoms become much worse (exacerbations). At the later stages of this disease, you may have swelling in the ankles, feet, or legs. How is this treated? Taking medicines. Quitting smoking, if you smoke. Rehabilitation. This includes steps to make your body work better. It may involve a team of specialists. Doing exercises. Making changes to your diet. Using oxygen. Lung surgery. Lung transplant. Comfort measures (palliative care). Follow these instructions at home: Medicines Take over-the-counter and prescription medicines only as told by your doctor. Talk to your doctor before taking any cough or allergy medicines. You may need to avoid medicines that cause your lungs to be dry. Lifestyle If you smoke, stop smoking. Smoking makes the problem worse. Do not smoke or use any products that  contain nicotine or tobacco. If you need help quitting, ask your doctor. Avoid being around things that make your breathing worse. This may include smoke, chemicals, and fumes. Stay active, but remember to rest as well. Learn and use tips on how to manage stress and control your breathing. Make sure you get enough sleep. Most adults need at least 7 hours of sleep every night. Eat healthy foods. Eat smaller meals more often. Rest before meals. Controlled breathing Learn and use tips on how to control your breathing as told by your doctor. Try: Breathing in (inhaling) through your nose for 1 second. Then, pucker your lips and breath out (exhale) through your lips for 2 seconds. Putting one hand on your belly (abdomen). Breathe in slowly through your nose for 1 second. Your hand on your belly should move out. Pucker your lips and breathe out slowly through your lips. Your hand on your belly should move in as you breathe out.  Controlled coughing Learn and use controlled coughing to clear mucus from your lungs. Follow these steps: Lean your head a little forward. Breathe in deeply. Try to hold your breath for 3 seconds. Keep your mouth slightly open while coughing 2 times. Spit any mucus out into a tissue. Rest and do the steps again 1 or 2 times as needed. General instructions Make sure you get all the shots (vaccines) that your doctor recommends. Ask your doctor about a flu shot and a pneumonia shot. Use oxygen therapy and pulmonary rehabilitation if told by your doctor. If you need home oxygen therapy, ask your doctor if you should buy a tool to measure your oxygen level (oximeter). Make a COPD action plan with your doctor. This helps you   to know what to do if you feel worse than usual. Manage any other conditions you have as told by your doctor. Avoid going outside when it is very hot, cold, or humid. Avoid people who have a sickness you can catch (contagious). Keep all follow-up  visits. Contact a doctor if: You cough up more mucus than usual. There is a change in the color or thickness of the mucus. It is harder to breathe than usual. Your breathing is faster than usual. You have trouble sleeping. You need to use your medicines more often than usual. You have trouble doing your normal activities such as getting dressed or walking around the house. Get help right away if: You have shortness of breath while resting. You have shortness of breath that stops you from: Being able to talk. Doing normal activities. Your chest hurts for longer than 5 minutes. Your skin color is more blue than usual. Your pulse oximeter shows that you have low oxygen for longer than 5 minutes. You have a fever. You feel too tired to breathe normally. These symptoms may represent a serious problem that is an emergency. Do not wait to see if the symptoms will go away. Get medical help right away. Call your local emergency services (911 in the [place] Do not drive yourself to the hospital. Summary Chronic obstructive pulmonary disease (COPD) is a long-term lung problem. The way your lungs work will never return to normal. Usually the condition gets worse over time. There are things you can do to keep yourself as healthy as possible. Take over-the-counter and prescription medicines only as told by your doctor. If you smoke, stop. Smoking makes the problem worse. This information is not intended to replace advice given to you by your health care provider. Make sure you discuss any questions you have with your health care provider. Document Revised: 05/17/2020 Document Reviewed: 05/17/2020 Elsevier Patient Education  2023 Elsevier Inc.  

## 2022-04-07 LAB — CBC WITH DIFFERENTIAL/PLATELET
Basophils Absolute: 0.1 10*3/uL (ref 0.0–0.2)
Basos: 1 %
EOS (ABSOLUTE): 0.3 10*3/uL (ref 0.0–0.4)
Eos: 3 %
Hematocrit: 43.7 % (ref 37.5–51.0)
Hemoglobin: 15 g/dL (ref 13.0–17.7)
Immature Grans (Abs): 0 10*3/uL (ref 0.0–0.1)
Immature Granulocytes: 0 %
Lymphocytes Absolute: 3.4 10*3/uL — ABNORMAL HIGH (ref 0.7–3.1)
Lymphs: 35 %
MCH: 29.9 pg (ref 26.6–33.0)
MCHC: 34.3 g/dL (ref 31.5–35.7)
MCV: 87 fL (ref 79–97)
Monocytes Absolute: 0.8 10*3/uL (ref 0.1–0.9)
Monocytes: 8 %
Neutrophils Absolute: 5.1 10*3/uL (ref 1.4–7.0)
Neutrophils: 53 %
Platelets: 331 10*3/uL (ref 150–450)
RBC: 5.02 x10E6/uL (ref 4.14–5.80)
RDW: 12.7 % (ref 11.6–15.4)
WBC: 9.6 10*3/uL (ref 3.4–10.8)

## 2022-04-07 LAB — CMP14+EGFR
ALT: 25 IU/L (ref 0–44)
AST: 22 IU/L (ref 0–40)
Albumin/Globulin Ratio: 2 (ref 1.2–2.2)
Albumin: 4.6 g/dL (ref 3.8–4.9)
Alkaline Phosphatase: 66 IU/L (ref 44–121)
BUN/Creatinine Ratio: 12 (ref 10–24)
BUN: 12 mg/dL (ref 8–27)
Bilirubin Total: 0.2 mg/dL (ref 0.0–1.2)
CO2: 22 mmol/L (ref 20–29)
Calcium: 10.1 mg/dL (ref 8.6–10.2)
Chloride: 102 mmol/L (ref 96–106)
Creatinine, Ser: 1 mg/dL (ref 0.76–1.27)
Globulin, Total: 2.3 g/dL (ref 1.5–4.5)
Glucose: 62 mg/dL — ABNORMAL LOW (ref 70–99)
Potassium: 4.1 mmol/L (ref 3.5–5.2)
Sodium: 142 mmol/L (ref 134–144)
Total Protein: 6.9 g/dL (ref 6.0–8.5)
eGFR: 86 mL/min/{1.73_m2} (ref >59)

## 2022-04-07 LAB — LIPID PANEL
Chol/HDL Ratio: 3.3 ratio (ref 0.0–5.0)
Cholesterol, Total: 123 mg/dL (ref 100–199)
HDL: 37 mg/dL — ABNORMAL LOW (ref >39)
LDL Chol Calc (NIH): 42 mg/dL (ref 0–99)
Triglycerides: 287 mg/dL — ABNORMAL HIGH (ref 0–149)
VLDL Cholesterol Cal: 44 mg/dL — ABNORMAL HIGH (ref 5–40)

## 2022-05-29 NOTE — Progress Notes (Signed)
[facility] Quality Team Note  Name: Kurt Blevins Date of Birth: 1961-06-16 MRN: [id number] Date: 05/29/2022  [facility] Quality Team has reviewed this patient's chart, please see recommendations below:  [facility] Quality Other; (KED: Kidney Health Evaluation Gap- Patient needs Urine Albumin Creatinine Ratio Test completed for gap closure. EGFR has already been completed, Patient has upcoming appointment with [facility] 07/09/2022.)

## 2022-07-09 ENCOUNTER — Encounter: Payer: Self-pay | Admitting: Nurse Practitioner

## 2022-07-09 ENCOUNTER — Ambulatory Visit (INDEPENDENT_AMBULATORY_CARE_PROVIDER_SITE_OTHER): Payer: Medicare Other | Admitting: Nurse Practitioner

## 2022-07-09 ENCOUNTER — Other Ambulatory Visit: Payer: Self-pay

## 2022-07-09 VITALS — BP 145/90 | HR 67 | Temp 97.7°F | Resp 20 | Ht 71.0 in | Wt 292.0 lb

## 2022-07-09 DIAGNOSIS — E782 Mixed hyperlipidemia: Secondary | ICD-10-CM | POA: Diagnosis not present

## 2022-07-09 DIAGNOSIS — E119 Type 2 diabetes mellitus without complications: Secondary | ICD-10-CM | POA: Diagnosis not present

## 2022-07-09 DIAGNOSIS — E1142 Type 2 diabetes mellitus with diabetic polyneuropathy: Secondary | ICD-10-CM

## 2022-07-09 DIAGNOSIS — I10 Essential (primary) hypertension: Secondary | ICD-10-CM | POA: Diagnosis not present

## 2022-07-09 DIAGNOSIS — E1140 Type 2 diabetes mellitus with diabetic neuropathy, unspecified: Secondary | ICD-10-CM

## 2022-07-09 DIAGNOSIS — Z125 Encounter for screening for malignant neoplasm of prostate: Secondary | ICD-10-CM

## 2022-07-09 DIAGNOSIS — J4489 Other specified chronic obstructive pulmonary disease: Secondary | ICD-10-CM

## 2022-07-09 LAB — BAYER DCA HB A1C WAIVED: HB A1C (BAYER DCA - WAIVED): 7.7 % — ABNORMAL HIGH (ref 4.8–5.6)

## 2022-07-09 MED ORDER — LISINOPRIL-HYDROCHLOROTHIAZIDE 20-25 MG PO TABS: 1.0000 | ORAL_TABLET | Freq: Every day | ORAL | 1 refills | Status: DC

## 2022-07-09 MED ORDER — ONETOUCH VERIO VI STRP: ORAL_STRIP | 3 refills | Status: AC

## 2022-07-09 MED ORDER — METFORMIN HCL 1000 MG PO TABS: ORAL_TABLET | ORAL | 1 refills | Status: DC

## 2022-07-09 MED ORDER — GLIPIZIDE 10 MG PO TABS: 10.0000 mg | ORAL_TABLET | Freq: Two times a day (BID) | ORAL | 1 refills | Status: DC

## 2022-07-09 MED ORDER — ATORVASTATIN CALCIUM 40 MG PO TABS: 40.0000 mg | ORAL_TABLET | Freq: Every day | ORAL | 1 refills | Status: DC

## 2022-07-09 MED ORDER — TRELEGY ELLIPTA 100-62.5-25 MCG/ACT IN AEPB: 1.0000 | INHALATION_SPRAY | Freq: Every day | RESPIRATORY_TRACT | 11 refills | Status: DC

## 2022-07-09 MED ORDER — FENOFIBRATE 160 MG PO TABS: 160.0000 mg | ORAL_TABLET | Freq: Every day | ORAL | 1 refills | Status: DC

## 2022-07-09 MED ORDER — ONETOUCH DELICA LANCETS 33G MISC: 5 refills | Status: AC

## 2022-07-09 NOTE — Patient Instructions (Signed)

## 2022-07-09 NOTE — Progress Notes (Signed)
Subjective:    Patient ID: Kurt Blevins, male    DOB: 1961/02/05, 61 y.o.   MRN: [id number]   Chief Complaint: medical management of chronic issues     HPI:  Kurt Blevins is a 61 y.o. who identifies as a male who was assigned male at birth.   Social history: Lives with: wife Work history: disability   Comes in today for follow up of the following chronic medical issues:  1. Primary hypertension No c/o chest pain, sob or headache. Doe snot check blood pressure at home. BP Readings from Last 3 Encounters:  07/09/22 (!) 145/90  04/06/22 (!) 147/86  01/04/22 139/79      2. Type 2 diabetes mellitus with diabetic polyneuropathy, without long-term current use of insulin (HCC) Fasting blood sugars are running around 120-140. No low blood sugars Lab Results  Component Value Date   HGBA1C 6.4 (H) 04/06/2022     3. Mixed hyperlipidemia Does not watch diet and does no  dedicated exercise. Lab Results  Component Value Date   CHOL 123 04/06/2022   HDL 37 (L) 04/06/2022   LDLCALC 42 04/06/2022   TRIG 287 (H) 04/06/2022   CHOLHDL 3.3 04/06/2022     4. Neuropathy due to type 2 diabetes mellitus (HCC) Has numbness and tingling in bil hands  5. Severe obesity (BMI >= 40) (HCC) Weight is up 3 lbs Wt Readings from Last 3 Encounters:  07/09/22 292 lb (132.5 kg)  04/06/22 289 lb (131.1 kg)  01/04/22 (!) 307 lb (139.3 kg)   BMI Readings from Last 3 Encounters:  07/09/22 40.73 kg/m  04/06/22 40.31 kg/m  01/04/22 42.82 kg/m    New complaints: None today  Allergies  Allergen Reactions   Penicillins Anaphylaxis and Other (See Comments)    [place]helps Has patient had a PCN reaction causing immediate rash, facial/tongue/throat swelling, SOB or lightheadedness with hypotension: Yes Has patient had a PCN reaction causing severe rash involving mucus membranes or skin necrosis: Yes Has patient had a PCN reaction that required hospitalization: Yes Has patient had a PCN  reaction occurring within the last 10 years: No If all of the above answers are "NO", then may proceed with Cephalosporin use.    Eggs Or Egg-Derived Products Nausea Only and Other (See Comments)    Stomach cramps   Peanut-Containing Drug Products Hives and Itching   Outpatient Encounter Medications as of 07/09/2022  Medication Sig   aspirin 81 MG tablet Take 81 mg by mouth daily.   atorvastatin (LIPITOR) 40 MG tablet Take 1 tablet (40 mg total) by mouth daily.   fenofibrate 160 MG tablet Take 1 tablet (160 mg total) by mouth daily.   Fluticasone-Umeclidin-Vilant (TRELEGY ELLIPTA) 100-62.5-25 MCG/ACT AEPB Inhale 1 puff into the lungs daily.   glipiZIDE (GLUCOTROL) 10 MG tablet Take 1 tablet (10 mg total) by mouth 2 (two) times daily before a meal.   glucose blood (ONETOUCH VERIO) test strip Check BS one daily Dx E11.42   ipratropium-albuterol (DUONEB) 0.5-2.5 (3) MG/3ML SOLN USE 3 MLS IN NEBULIZER AND INHALE INTO LUNGS 3 TIMES DAILY AS NEEDED FOR SHORTNESS OF BREATH.   lisinopril-hydrochlorothiazide (ZESTORETIC) 20-25 MG tablet Take 1 tablet by mouth daily.   metFORMIN (GLUCOPHAGE) 1000 MG tablet TAKE 1 TABLET TWICE DAILY WITH A MEAL.   ONETOUCH DELICA LANCETS 27O MISC Check blood sugar 1x per day and prn  E 11.9   No facility-administered encounter medications on file as of 07/09/2022.    Past Surgical History:  Procedure Laterality Date   BACK SURGERY  1990s   COLONOSCOPY N/A 02/25/2018   Procedure: COLONOSCOPY;  Surgeon: Doran Stabler, MD;  Location: [facility];  Service: Gastroenterology;  Laterality: N/A;   HERNIA REPAIR     NECK SURGERY     POLYPECTOMY  02/25/2018   Procedure: POLYPECTOMY;  Surgeon: Doran Stabler, MD;  Location: [facility];  Service: Gastroenterology;;   TONSILLECTOMY      Family History  Problem Relation Age of Onset   Diabetes Mother    Hypertension Mother    Hyperlipidemia Mother    Arthritis Mother    Osteoporosis Mother    Diabetes  Father    Hypertension Father    Hyperlipidemia Father    Heart attack Father 64       Late 70s and 46s   Cancer Paternal Grandmother        colon   Healthy Sister    Healthy Brother    Healthy Daughter    Healthy Son    Healthy Son    Healthy Son       Controlled substance contract: n/a     Review of Systems  Constitutional:  Negative for diaphoresis.  Eyes:  Negative for pain.  Respiratory:  Negative for shortness of breath.   Cardiovascular:  Negative for chest pain, palpitations and leg swelling.  Gastrointestinal:  Negative for abdominal pain.  Endocrine: Negative for polydipsia.  Skin:  Negative for rash.  Neurological:  Negative for dizziness, weakness and headaches.  Hematological:  Does not bruise/bleed easily.  All other systems reviewed and are negative.      Objective:   Physical Exam Vitals and nursing note reviewed.  Constitutional:      Appearance: Normal appearance. He is well-developed.  HENT:     Head: Normocephalic.     Nose: Nose normal.     Mouth/Throat:     Mouth: Mucous membranes are moist.     Pharynx: Oropharynx is clear.  Eyes:     Pupils: Pupils are equal, round, and reactive to light.  Neck:     Thyroid: No thyroid mass or thyromegaly.     Vascular: No carotid bruit or JVD.     Trachea: Phonation normal.  Cardiovascular:     Rate and Rhythm: Normal rate and regular rhythm.  Pulmonary:     Effort: Pulmonary effort is normal. No respiratory distress.     Breath sounds: Normal breath sounds.  Abdominal:     General: Bowel sounds are normal.     Palpations: Abdomen is soft.     Tenderness: There is no abdominal tenderness.  Musculoskeletal:        General: Normal range of motion.     Cervical back: Normal range of motion and neck supple.  Lymphadenopathy:     Cervical: No cervical adenopathy.  Skin:    General: Skin is warm and dry.  Neurological:     Mental Status: He is alert and oriented to person, place, and time.   Psychiatric:        Behavior: Behavior normal.        Thought Content: Thought content normal.        Judgment: Judgment normal.     BP (!) 145/90   Pulse 67   Temp 97.7 F (36.5 C) (Temporal)   Resp 20   Ht 5' 11" (1.803 m)   Wt 292 lb (132.5 kg)   SpO2 96%   BMI 40.73 kg/m  HGBA1c 7.7%     Assessment & Plan:  Kurt Blevins comes in today with chief complaint of Medical Management of Chronic Issues   Diagnosis and orders addressed:  1. Primary hypertension Low sodium diet - CBC with Differential/Platelet - CMP14+EGFR - lisinopril-hydrochlorothiazide (ZESTORETIC) 20-25 MG tablet; Take 1 tablet by mouth daily.  Dispense: 90 tablet; Refill: 1  2. Type 2 diabetes mellitus with diabetic polyneuropathy, without long-term current use of insulin (HCC) Stricter carb counting - Bayer DCA Hb A1c Waived - glipiZIDE (GLUCOTROL) 10 MG tablet; Take 1 tablet (10 mg total) by mouth 2 (two) times daily before a meal.  Dispense: 180 tablet; Refill: 1 - metFORMIN (GLUCOPHAGE) 1000 MG tablet; TAKE 1 TABLET TWICE DAILY WITH A MEAL.  Dispense: 180 tablet; Refill: 1  3. Mixed hyperlipidemia Low fat diet - Lipid panel - atorvastatin (LIPITOR) 40 MG tablet; Take 1 tablet (40 mg total) by mouth daily.  Dispense: 90 tablet; Refill: 1 - fenofibrate 160 MG tablet; Take 1 tablet (160 mg total) by mouth daily.  Dispense: 90 tablet; Refill: 1  4. Neuropathy due to type 2 diabetes mellitus (HCC) Hands- may have carpal tunnel , but he does not want to do anything about it right now  5. Severe obesity (BMI >= 40) (HCC) Discussed diet and exercise for person with BMI >25 Will recheck weight in 3-6 months   6. Screening for prostate cancer Labs pending - PSA, total and free  7. COPD with asthma - Fluticasone-Umeclidin-Vilant (TRELEGY ELLIPTA) 100-62.5-25 MCG/ACT AEPB; Inhale 1 puff into the lungs daily.  Dispense: 1 each; Refill: 11   Labs pending Health Maintenance reviewed Diet and  exercise encouraged  Follow up plan: 3 months   Mary-Margaret Hassell Done, FNP

## 2022-07-10 LAB — CBC WITH DIFFERENTIAL/PLATELET
Basophils Absolute: 0.1 10*3/uL (ref 0.0–0.2)
Basos: 1 %
EOS (ABSOLUTE): 0.3 10*3/uL (ref 0.0–0.4)
Eos: 3 %
Hematocrit: 46.1 % (ref 37.5–51.0)
Hemoglobin: 15.6 g/dL (ref 13.0–17.7)
Immature Grans (Abs): 0 10*3/uL (ref 0.0–0.1)
Immature Granulocytes: 1 %
Lymphocytes Absolute: 3.1 10*3/uL (ref 0.7–3.1)
Lymphs: 35 %
MCH: 29.3 pg (ref 26.6–33.0)
MCHC: 33.8 g/dL (ref 31.5–35.7)
MCV: 87 fL (ref 79–97)
Monocytes Absolute: 0.8 10*3/uL (ref 0.1–0.9)
Monocytes: 9 %
Neutrophils Absolute: 4.7 10*3/uL (ref 1.4–7.0)
Neutrophils: 51 %
Platelets: 295 10*3/uL (ref 150–450)
RBC: 5.32 x10E6/uL (ref 4.14–5.80)
RDW: 12.5 % (ref 11.6–15.4)
WBC: 8.9 10*3/uL (ref 3.4–10.8)

## 2022-07-10 LAB — CMP14+EGFR
ALT: 20 IU/L (ref 0–44)
AST: 21 IU/L (ref 0–40)
Albumin/Globulin Ratio: 2 (ref 1.2–2.2)
Albumin: 4.5 g/dL (ref 3.8–4.9)
Alkaline Phosphatase: 62 IU/L (ref 44–121)
BUN/Creatinine Ratio: 14 (ref 10–24)
BUN: 15 mg/dL (ref 8–27)
Bilirubin Total: 0.3 mg/dL (ref 0.0–1.2)
CO2: 24 mmol/L (ref 20–29)
Calcium: 9.8 mg/dL (ref 8.6–10.2)
Chloride: 102 mmol/L (ref 96–106)
Creatinine, Ser: 1.05 mg/dL (ref 0.76–1.27)
Globulin, Total: 2.2 g/dL (ref 1.5–4.5)
Glucose: 60 mg/dL — ABNORMAL LOW (ref 70–99)
Potassium: 4.3 mmol/L (ref 3.5–5.2)
Sodium: 144 mmol/L (ref 134–144)
Total Protein: 6.7 g/dL (ref 6.0–8.5)
eGFR: 81 mL/min/{1.73_m2} (ref >59)

## 2022-07-10 LAB — PSA, TOTAL AND FREE
PSA, Free Pct: 28 %
PSA, Free: 0.28 ng/mL
Prostate Specific Ag, Serum: 1 ng/mL (ref 0.0–4.0)

## 2022-07-10 LAB — LIPID PANEL
Chol/HDL Ratio: 4.4 ratio (ref 0.0–5.0)
Cholesterol, Total: 149 mg/dL (ref 100–199)
HDL: 34 mg/dL — ABNORMAL LOW (ref >39)
LDL Chol Calc (NIH): 68 mg/dL (ref 0–99)
Triglycerides: 294 mg/dL — ABNORMAL HIGH (ref 0–149)
VLDL Cholesterol Cal: 47 mg/dL — ABNORMAL HIGH (ref 5–40)

## 2022-09-09 ENCOUNTER — Other Ambulatory Visit: Payer: Self-pay | Admitting: Nurse Practitioner

## 2022-09-09 DIAGNOSIS — J4489 Other specified chronic obstructive pulmonary disease: Secondary | ICD-10-CM

## 2022-10-04 ENCOUNTER — Ambulatory Visit (INDEPENDENT_AMBULATORY_CARE_PROVIDER_SITE_OTHER): Payer: Medicare Other

## 2022-10-04 VITALS — Ht 72.0 in | Wt 295.0 lb

## 2022-10-04 DIAGNOSIS — Z0000 Encounter for general adult medical examination without abnormal findings: Secondary | ICD-10-CM

## 2022-10-04 DIAGNOSIS — Z0100 Encounter for examination of eyes and vision without abnormal findings: Secondary | ICD-10-CM

## 2022-10-04 NOTE — Patient Instructions (Signed)
Kurt Blevins , Thank you for taking time to come for your Medicare Wellness Visit. I appreciate your ongoing commitment to your health goals. Please review the following plan we discussed and let me know if I can assist you in the future.   These are the goals we discussed:  Goals       Patient Stated      09/29/2020 AWV Goal: Improved Nutrition/Diet  Patient will verbalize understanding that diet plays an important role in overall health and that a poor diet is a risk factor for many chronic medical conditions.  Over the next year, patient will improve self management of their diet by incorporating fewer sweetened foods & beverages, increased physical activity, better food choices, and watch portion sizes/amount of food eaten at one time. Patient will utilize available community resources to help with food acquisition if needed (ex: food pantries, [address], etc) Patient will work with nutrition specialist if a referral was made       Weight (lb) < 285 lb (129.3 kg) (pt-stated)      He has cut back on carbs and sugar. He has lost 35 pounds in last 6 months.        This is a list of the screening recommended for you and due dates:  Health Maintenance  Topic Date Due   HIV Screening  Never done   Yearly kidney health urinalysis for diabetes  Never done   Eye exam for diabetics  04/09/2020   COVID-19 Vaccine (4 - 2023-24 season) 03/23/2022   Zoster (Shingles) Vaccine (1 of 2) 10/08/2022*   Flu Shot  10/21/2022*   Colon Cancer Screening  06/04/2023*   Hemoglobin A1C  01/08/2023   Yearly kidney function blood test for diabetes  07/10/2023   Complete foot exam   07/10/2023   Medicare Annual Wellness Visit  10/04/2023   DTaP/Tdap/Td vaccine (2 - Td or Tdap) 02/02/2024   Hepatitis C Screening: USPSTF Recommendation to screen - Ages 75-79 yo.  Completed   HPV Vaccine  Aged Out  *Topic was postponed. The date shown is not the original due date.    Advanced directives: Advance directive  discussed with you today. I have provided a copy for you to complete at home and have notarized. Once this is complete please bring a copy in to our office so we can scan it into your chart.   Conditions/risks identified: Aim for 30 minutes of exercise or brisk walking, 6-8 glasses of water, and 5 servings of fruits and vegetables each day.   Next appointment: Follow up in one year for your annual wellness visit   Preventive Care 40-64 Years, Male Preventive care refers to lifestyle choices and visits with your health care provider that can promote health and wellness. What does preventive care include? A yearly physical exam. This is also called an annual well check. Dental exams once or twice a year. Routine eye exams. Ask your health care provider how often you should have your eyes checked. Personal lifestyle choices, including: Daily care of your teeth and gums. Regular physical activity. Eating a healthy diet. Avoiding tobacco and drug use. Limiting alcohol use. Practicing safe sex. Taking low-dose aspirin every day starting at age 57. What happens during an annual well check? The services and screenings done by your health care provider during your annual well check will depend on your age, overall health, lifestyle risk factors, and family history of disease. Counseling  Your health care provider may ask you questions  about your: Alcohol use. Tobacco use. Drug use. Emotional well-being. Home and relationship well-being. Sexual activity. Eating habits. Work and work Statistician. Screening  You may have the following tests or measurements: Height, weight, and BMI. Blood pressure. Lipid and cholesterol levels. These may be checked every 5 years, or more frequently if you are over 23 years old. Skin check. Lung cancer screening. You may have this screening every year starting at age 103 if you have a 30-pack-year history of smoking and currently smoke or have quit within the  past 15 years. Fecal occult blood test (FOBT) of the stool. You may have this test every year starting at age 13. Flexible sigmoidoscopy or colonoscopy. You may have a sigmoidoscopy every 5 years or a colonoscopy every 10 years starting at age 48. Prostate cancer screening. Recommendations will vary depending on your family history and other risks. Hepatitis C blood test. Hepatitis B blood test. Sexually transmitted disease (STD) testing. Diabetes screening. This is done by checking your blood sugar (glucose) after you have not eaten for a while (fasting). You may have this done every 1-3 years. Discuss your test results, treatment options, and if necessary, the need for more tests with your health care provider. Vaccines  Your health care provider may recommend certain vaccines, such as: Influenza vaccine. This is recommended every year. Tetanus, diphtheria, and acellular pertussis (Tdap, Td) vaccine. You may need a Td booster every 10 years. Zoster vaccine. You may need this after age 85. Pneumococcal 13-valent conjugate (PCV13) vaccine. You may need this if you have certain conditions and have not been vaccinated. Pneumococcal polysaccharide (PPSV23) vaccine. You may need one or two doses if you smoke cigarettes or if you have certain conditions. Talk to your health care provider about which screenings and vaccines you need and how often you need them. This information is not intended to replace advice given to you by your health care provider. Make sure you discuss any questions you have with your health care provider. Document Released: 08/05/2015 Document Revised: 03/28/2016 Document Reviewed: 05/10/2015 Elsevier Interactive Patient Education  2017 [employer] Prevention in the Home Falls can cause injuries. They can happen to people of all ages. There are many things you can do to make your home safe and to help prevent falls. What can I do on the outside of my home? Regularly  fix the edges of walkways and driveways and fix any cracks. Remove anything that might make you trip as you walk through a door, such as a raised step or threshold. Trim any bushes or trees on the path to your home. Use bright outdoor lighting. Clear any walking paths of anything that might make someone trip, such as rocks or tools. Regularly check to see if handrails are loose or broken. Make sure that both sides of any steps have handrails. Any raised decks and porches should have guardrails on the edges. Have any leaves, snow, or ice cleared regularly. Use sand or salt on walking paths during winter. Clean up any spills in your garage right away. This includes oil or grease spills. What can I do in the bathroom? Use night lights. Install grab bars by the toilet and in the tub and shower. Do not use towel bars as grab bars. Use non-skid mats or decals in the tub or shower. If you need to sit down in the shower, use a plastic, non-slip stool. Keep the floor dry. Clean up any water that spills on the floor as  soon as it happens. Remove soap buildup in the tub or shower regularly. Attach bath mats securely with double-sided non-slip rug tape. Do not have throw rugs and other things on the floor that can make you trip. What can I do in the bedroom? Use night lights. Make sure that you have a light by your bed that is easy to reach. Do not use any sheets or blankets that are too big for your bed. They should not hang down onto the floor. Have a firm chair that has side arms. You can use this for support while you get dressed. Do not have throw rugs and other things on the floor that can make you trip. What can I do in the kitchen? Clean up any spills right away. Avoid walking on wet floors. Keep items that you use a lot in easy-to-reach places. If you need to reach something above you, use a strong step stool that has a grab bar. Keep electrical cords out of the way. Do not use floor  polish or wax that makes floors slippery. If you must use wax, use non-skid floor wax. Do not have throw rugs and other things on the floor that can make you trip. What can I do with my stairs? Do not leave any items on the stairs. Make sure that there are handrails on both sides of the stairs and use them. Fix handrails that are broken or loose. Make sure that handrails are as long as the stairways. Check any carpeting to make sure that it is firmly attached to the stairs. Fix any carpet that is loose or worn. Avoid having throw rugs at the top or bottom of the stairs. If you do have throw rugs, attach them to the floor with carpet tape. Make sure that you have a light switch at the top of the stairs and the bottom of the stairs. If you do not have them, ask someone to add them for you. What else can I do to help prevent falls? Wear shoes that: Do not have high heels. Have rubber bottoms. Are comfortable and fit you well. Are closed at the toe. Do not wear sandals. If you use a stepladder: Make sure that it is fully opened. Do not climb a closed stepladder. Make sure that both sides of the stepladder are locked into place. Ask someone to hold it for you, if possible. Clearly mark and make sure that you can see: Any grab bars or handrails. First and last steps. Where the edge of each step is. Use tools that help you move around (mobility aids) if they are needed. These include: Canes. Walkers. Scooters. Crutches. Turn on the lights when you go into a dark area. Replace any light bulbs as soon as they burn out. Set up your furniture so you have a clear path. Avoid moving your furniture around. If any of your floors are uneven, fix them. If there are any pets around you, be aware of where they are. Review your medicines with your doctor. Some medicines can make you feel dizzy. This can increase your chance of falling. Ask your doctor what other things that you can do to help prevent  falls. This information is not intended to replace advice given to you by your health care provider. Make sure you discuss any questions you have with your health care provider. Document Released: 05/05/2009 Document Revised: 12/15/2015 Document Reviewed: 08/13/2014 Elsevier Interactive Patient Education  2017 [employer].

## 2022-10-04 NOTE — Progress Notes (Addendum)
Subjective:   Kurt Blevins is a 62 y.o. male who presents for Medicare Annual/Subsequent preventive examination. I connected with  [name] on 10/04/22 by a audio enabled telemedicine application and verified that I am speaking with the correct person using two identifiers.  Patient Location: Home  Provider Location: Home Office  I discussed the limitations of evaluation and management by telemedicine. The patient expressed understanding and agreed to proceed.  Review of Systems     Cardiac Risk Factors include: advanced age (>64mn, >>67women);diabetes mellitus;hypertension;male gender;dyslipidemia     Objective:    Today's Vitals   10/04/22 1028  Weight: 295 lb (133.8 kg)  Height: 6' (1.829 m)   Body mass index is 40.01 kg/m.     10/04/2022   10:34 AM 10/02/2021   12:00 PM 09/29/2020    1:58 PM 09/29/2019    8:41 AM 09/25/2018    9:02 AM 02/25/2018    9:03 AM  Advanced Directives  Does Patient Have a Medical Advance Directive? Yes No No No No No  Type of [place]amed[place]of [place] will       Copy of HG[place] Chart? No - copy requested       Would patient like information on creating a medical advance directive?  No - Patient declined No - Patient declined No - Patient declined Yes (MAU/Ambulatory/Procedural Areas - Information given) No - Patient declined    Current Medications (verified) Outpatient Encounter Medications as of 10/04/2022  Medication Sig   aspirin 81 MG tablet Take 81 mg by mouth daily.   atorvastatin (LIPITOR) 40 MG tablet Take 1 tablet (40 mg total) by mouth daily.   fenofibrate 160 MG tablet Take 1 tablet (160 mg total) by mouth daily.   Fluticasone-Umeclidin-Vilant (TRELEGY ELLIPTA) 100-62.5-25 MCG/ACT AEPB Inhale 1 puff into the lungs daily.   glipiZIDE (GLUCOTROL) 10 MG tablet Take 1 tablet (10 mg total) by mouth 2 (two) times daily before a meal.   glucose blood (ONETOUCH VERIO) test strip  Check BS one daily Dx E11.42   ipratropium-albuterol (DUONEB) 0.5-2.5 (3) MG/3ML SOLN USE 1 VIAL VIA NEBULIZER 3 TIMES DAILY AS NEEDED FOR SHORTNESS OF BREATH   lisinopril-hydrochlorothiazide (ZESTORETIC) 20-25 MG tablet Take 1 tablet by mouth daily.   metFORMIN (GLUCOPHAGE) 1000 MG tablet TAKE 1 TABLET TWICE DAILY WITH A MEAL.   OneTouch Delica Lancets [id number] Check blood sugar 1x per day and prn  E 11.9   No facility-administered encounter medications on file as of 10/04/2022.    Allergies (verified) Penicillins, Egg-derived products, and Peanut-containing drug products   History: Past Medical History:  Diagnosis Date   COPD (chronic obstructive pulmonary disease) ([place]    Hyperlipidemia    Hyperlipidemia 11/26/2012   Hypertension    Neuropathy    patient reported   Past Surgical History:  Procedure Laterality Date   BACK SURGERY  1990s   COLONOSCOPY N/A 02/25/2018   Procedure: COLONOSCOPY;  Surgeon: DDoran Stabler MD;  Location: [facility];  Service: Gastroenterology;  Laterality: N/A;   HERNIA REPAIR     NECK SURGERY     POLYPECTOMY  02/25/2018   Procedure: POLYPECTOMY;  Surgeon: DDoran Stabler MD;  Location: [facility];  Service: Gastroenterology;;   TONSILLECTOMY     Family History  Problem Relation Age of Onset   Diabetes Mother    Hypertension Mother    Hyperlipidemia Mother    Arthritis Mother  Osteoporosis Mother    Diabetes Father    Hypertension Father    Hyperlipidemia Father    Heart attack Father 86       Late 48s and 43s   Cancer Paternal Grandmother        colon   Healthy Sister    Healthy Brother    Healthy Daughter    Healthy Son    Healthy Son    Healthy Son    Social History   Socioeconomic History   Marital status: Married    Spouse name: Shirlean Mylar   Number of children: 4   Years of education: Not on file   Highest education level: 11th grade  Occupational History   Occupation: Disabled    Comment: Back problems  Tobacco Use    Smoking status: Former    Packs/day: 1.00    Years: 10.00    Additional pack years: 0.00    Total pack years: 10.00    Types: Cigarettes    Quit date: 11/26/2008    Years since quitting: 13.8   Smokeless tobacco: Never  Vaping Use   Vaping Use: Never used  Substance and Sexual Activity   Alcohol use: No   Drug use: No   Sexual activity: Not on file  Other Topics Concern   Not on file  Social History Narrative   Not on file   Social Determinants of Health   Financial Resource Strain: Low Risk  (10/04/2022)   Overall Financial Resource Strain (CARDIA)    Difficulty of Paying Living Expenses: Not hard at all  Food Insecurity: No Food Insecurity (10/04/2022)   Hunger Vital Sign    Worried About Running Out of Food in the Last Year: Never true    [place] in the Last Year: Never true  Transportation Needs: No Transportation Needs (10/04/2022)   PRAPARE - Hydrologist (Medical): No    Lack of Transportation (Non-Medical): No  Physical Activity: Insufficiently Active (10/04/2022)   Exercise Vital Sign    Days of Exercise per Week: 3 days    Minutes of Exercise per Session: 30 min  Stress: No Stress Concern Present (10/04/2022)   [place]    Feeling of Stress : Not at all  Social Connections: [place] (10/04/2022)   Social Connection and Isolation Panel [NHANES]    Frequency of Communication with Friends and Family: More than three times a week    Frequency of Social Gatherings with Friends and Family: More than three times a week    Attends Religious Services: 1 to 4 times per year    Active Member of [employer] or Organizations: Yes    Attends Archivist Meetings: 1 to 4 times per year    Marital Status: Married    Tobacco Counseling Counseling given: Not Answered   Clinical Intake:  Pre-visit preparation completed: Yes  Pain : No/denies pain      Nutritional Risks: None Diabetes: Yes CBG done?: No Did pt. bring in CBG monitor from home?: No  How often do you need to have someone help you when you read instructions, pamphlets, or other written materials from your doctor or pharmacy?: 1 - Never  Diabetic?yes  Nutrition Risk Assessment:  Has the patient had any N/V/D within the last 2 months?  No  Does the patient have any non-healing wounds?  No  Has the patient had any unintentional weight loss or weight gain?  No   Diabetes:  Is the patient diabetic?  Yes  If diabetic, was a CBG obtained today?  No  Did the patient bring in their glucometer from home?  No  How often do you monitor your CBG's? 2 x day .   Financial Strains and Diabetes Management:  Are you having any financial strains with the device, your supplies or your medication? No .  Does the patient want to be seen by Chronic Care Management for management of their diabetes?  No  Would the patient like to be referred to a Nutritionist or for Diabetic Management?  No   Diabetic Exams:  Diabetic Eye Exam: Overdue for diabetic eye exam. Pt has been advised about the importance in completing this exam. Patient advised to call and schedule an eye exam. Diabetic Foot Exam: Overdue, Pt has been advised about the importance in completing this exam. Pt is scheduled for diabetic foot exam on next office visit .   Interpreter Needed?: No  Information entered by :: Jadene Pierini, LPN   Activities of Daily Living    10/04/2022   10:34 AM  In your present state of health, do you have any difficulty performing the following activities:  Hearing? 0  Vision? 0  Difficulty concentrating or making decisions? 0  Walking or climbing stairs? 0  Dressing or bathing? 0  Doing errands, shopping? 0  Preparing Food and eating ? N  Using the Toilet? N  In the past six months, have you accidently leaked urine? N  Do you have problems with loss of bowel control? N  Managing  your Medications? N  Managing your Finances? N  Housekeeping or managing your Housekeeping? N    Patient Care Team: Chevis Pretty, FNP as PCP - General (Nurse Practitioner) Doran Stabler, MD as Consulting Physician (Gastroenterology)  Indicate any recent Medical Services you may have received from other than [facility] providers in the past year (date may be approximate).     Assessment:   This is a routine wellness examination for Khalen.  Hearing/Vision screen Vision Screening - Comments:: Referral 10/04/2022  Dietary issues and exercise activities discussed: Current Exercise Habits: Home exercise routine, Type of exercise: walking, Time (Minutes): 30, Frequency (Times/Week): 3, Weekly Exercise (Minutes/Week): 90, Intensity: Mild, Exercise limited by: orthopedic condition(s)   Goals Addressed             This Visit's Progress    Patient Stated   On track    09/29/2020 AWV Goal: Improved Nutrition/Diet  Patient will verbalize understanding that diet plays an important role in overall health and that a poor diet is a risk factor for many chronic medical conditions.  Over the next year, patient will improve self management of their diet by incorporating fewer sweetened foods & beverages, increased physical activity, better food choices, and watch portion sizes/amount of food eaten at one time. Patient will utilize available community resources to help with food acquisition if needed (ex: food pantries, [address], etc) Patient will work with nutrition specialist if a referral was made        Depression Screen    10/04/2022   10:32 AM 07/09/2022    2:13 PM 04/06/2022    2:07 PM 01/04/2022    2:05 PM 10/02/2021   11:59 AM 09/29/2021    2:48 PM 07/03/2021    2:21 PM  PHQ 2/9 Scores  PHQ - 2 Score 0 0 0 0 0  0  PHQ- 9 Score  0 0  0   0  Exception Documentation      Patient refusal     Fall Risk    10/04/2022   10:31 AM 07/09/2022    2:13 PM 04/06/2022    2:07 PM  01/04/2022    2:05 PM 10/02/2021   11:22 AM  Fall Risk   Falls in the past year? 0 0 0 0 0  Number falls in past yr: 0    0  Injury with Fall? 0    0  Risk for fall due to : No Fall Risks    Impaired balance/gait;Orthopedic patient;Medication side effect  Follow up Falls prevention discussed    Falls prevention discussed;Education provided    FALL RISK PREVENTION PERTAINING TO THE HOME:  Any stairs in or around the home? No  If so, are there any without handrails? No  Home free of loose throw rugs in walkways, pet beds, electrical cords, etc? Yes  Adequate lighting in your home to reduce risk of falls? Yes   ASSISTIVE DEVICES UTILIZED TO PREVENT FALLS:  Life alert? No  Use of a cane, walker or w/c? Yes  Grab bars in the bathroom? Yes  Shower chair or bench in shower? Yes  Elevated toilet seat or a handicapped toilet? Yes       09/25/2018   10:13 AM 06/10/2017    3:35 PM  MMSE - Mini Mental State Exam  Orientation to time 5 5  Orientation to Place 4 5  Registration 3 3  Attention/ Calculation 0 0  Attention/Calculation-comments Patient did not attempt not attempted  Recall 2 3  Language- name 2 objects 2 2  Language- repeat 1 1  Language- follow 3 step command 3 3  Language- read & follow direction 1 1  Write a sentence 0 0  Copy design 1 0  Total score 22 23        10/04/2022   10:35 AM 09/29/2020    2:00 PM 09/29/2019    9:05 AM 09/29/2019    8:43 AM  6CIT Screen  What Year? 0 points 0 points 0 points 0 points  What month? 0 points 0 points 0 points 0 points  What time? 0 points 0 points 0 points 0 points  Count back from 20 0 points 0 points 0 points   Months in reverse 0 points 0 points 0 points   Repeat phrase 0 points 0 points 2 points   Total Score 0 points 0 points 2 points     Immunizations Immunization History  Administered Date(s) Administered   PFIZER(Purple Top)SARS-COV-2 Vaccination 12/08/2019, 12/31/2019, 05/01/2021   Tdap 02/01/2014    TDAP  status: Up to date  Flu Vaccine status: Declined, Education has been provided regarding the importance of this vaccine but patient still declined. Advised may receive this vaccine at local pharmacy or Health Dept. Aware to provide a copy of the vaccination record if obtained from local pharmacy or Health Dept. Verbalized acceptance and understanding.  Pneumococcal vaccine status: Declined,  Education has been provided regarding the importance of this vaccine but patient still declined. Advised may receive this vaccine at local pharmacy or Health Dept. Aware to provide a copy of the vaccination record if obtained from local pharmacy or Health Dept. Verbalized acceptance and understanding.   Covid-19 vaccine status: Completed vaccines  Qualifies for Shingles Vaccine? Yes   Zostavax completed No   Shingrix Completed?: No.    Education has been provided regarding the importance of this vaccine. Patient has  been advised to call insurance company to determine out of pocket expense if they have not yet received this vaccine. Advised may also receive vaccine at local pharmacy or Health Dept. Verbalized acceptance and understanding.  Screening Tests Health Maintenance  Topic Date Due   HIV Screening  Never done   Diabetic kidney evaluation - Urine ACR  Never done   OPHTHALMOLOGY EXAM  04/09/2020   COVID-19 Vaccine (4 - 2023-24 season) 03/23/2022   Zoster Vaccines- Shingrix (1 of 2) 10/08/2022 (Originally [birth year]/16/1980)   INFLUENZA VACCINE  10/21/2022 (Originally 02/20/2022)   COLONOSCOPY (Pts 45-84yrs Insurance coverage will need to be confirmed)  06/04/2023 (Originally 02/26/2019)   HEMOGLOBIN A1C  01/08/2023   Diabetic kidney evaluation - eGFR measurement  07/10/2023   FOOT EXAM  07/10/2023   Medicare Annual Wellness (AWV)  10/04/2023   DTaP/Tdap/Td (2 - Td or Tdap) 02/02/2024   Hepatitis C Screening  Completed   HPV VACCINES  Aged Out    Health Maintenance  Health Maintenance Due  Topic Date Due    HIV Screening  Never done   Diabetic kidney evaluation - Urine ACR  Never done   OPHTHALMOLOGY EXAM  04/09/2020   COVID-19 Vaccine (4 - 2023-24 season) 03/23/2022    Colorectal cancer screening: Referral to GI placed 10/04/2022. Pt aware the office will call re: appt.  Lung Cancer Screening: (Low Dose CT Chest recommended if Age 81-80 years, 30 pack-year currently smoking OR have quit w/in 15years.) does not qualify.   Lung Cancer Screening Referral: n/a  Additional Screening:  Hepatitis C Screening: does not qualify; Completed 08/02/2015  Vision Screening: Recommended annual ophthalmology exams for early detection of glaucoma and other disorders of the eye. Is the patient up to date with their annual eye exam?  No  Who is the provider or what is the name of the office in which the patient attends annual eye exams? None referral 10/04/2022 If pt is not established with a provider, would they like to be referred to a provider to establish care? No .   Dental Screening: Recommended annual dental exams for proper oral hygiene  Community Resource Referral / Chronic Care Management: CRR required this visit?  No   CCM required this visit?  No      Plan:     I have personally reviewed and noted the following in the patient's chart:   Medical and social history Use of alcohol, tobacco or illicit drugs  Current medications and supplements including opioid prescriptions. Patient is not currently taking opioid prescriptions. Functional ability and status Nutritional status Physical activity Advanced directives List of other physicians Hospitalizations, surgeries, and ER visits in previous 12 months Vitals Screenings to include cognitive, depression, and falls Referrals and appointments  In addition, I have reviewed and discussed with patient certain preventive protocols, quality metrics, and best practice recommendations. A written personalized care plan for preventive  services as well as general preventive health recommendations were provided to patient.     Daphane Shepherd, LPN   11/05/6061   Nurse Notes: Due Pneumonia vaccine   I have reviewed and agree with the above AWV documentation.   Mary-Margaret Hassell Done, FNP

## 2022-10-08 ENCOUNTER — Ambulatory Visit (INDEPENDENT_AMBULATORY_CARE_PROVIDER_SITE_OTHER): Payer: Medicare Other

## 2022-10-08 ENCOUNTER — Ambulatory Visit (INDEPENDENT_AMBULATORY_CARE_PROVIDER_SITE_OTHER): Payer: Medicare Other | Admitting: Nurse Practitioner

## 2022-10-08 ENCOUNTER — Encounter: Payer: Self-pay | Admitting: Nurse Practitioner

## 2022-10-08 VITALS — BP 147/91 | HR 71 | Temp 97.2°F | Resp 20 | Ht 72.0 in | Wt 293.0 lb

## 2022-10-08 DIAGNOSIS — Z7984 Long term (current) use of oral hypoglycemic drugs: Secondary | ICD-10-CM

## 2022-10-08 DIAGNOSIS — Z0000 Encounter for general adult medical examination without abnormal findings: Secondary | ICD-10-CM

## 2022-10-08 DIAGNOSIS — I10 Essential (primary) hypertension: Secondary | ICD-10-CM

## 2022-10-08 DIAGNOSIS — R918 Other nonspecific abnormal finding of lung field: Secondary | ICD-10-CM | POA: Diagnosis not present

## 2022-10-08 DIAGNOSIS — E1169 Type 2 diabetes mellitus with other specified complication: Secondary | ICD-10-CM

## 2022-10-08 DIAGNOSIS — Z0001 Encounter for general adult medical examination with abnormal findings: Secondary | ICD-10-CM

## 2022-10-08 DIAGNOSIS — E782 Mixed hyperlipidemia: Secondary | ICD-10-CM | POA: Diagnosis not present

## 2022-10-08 DIAGNOSIS — J4489 Other specified chronic obstructive pulmonary disease: Secondary | ICD-10-CM

## 2022-10-08 DIAGNOSIS — Z6839 Body mass index (BMI) 39.0-39.9, adult: Secondary | ICD-10-CM

## 2022-10-08 DIAGNOSIS — E1140 Type 2 diabetes mellitus with diabetic neuropathy, unspecified: Secondary | ICD-10-CM | POA: Diagnosis not present

## 2022-10-08 DIAGNOSIS — E1142 Type 2 diabetes mellitus with diabetic polyneuropathy: Secondary | ICD-10-CM | POA: Diagnosis not present

## 2022-10-08 DIAGNOSIS — E785 Hyperlipidemia, unspecified: Secondary | ICD-10-CM

## 2022-10-08 LAB — BAYER DCA HB A1C WAIVED: HB A1C (BAYER DCA - WAIVED): 7.5 % — ABNORMAL HIGH (ref 4.8–5.6)

## 2022-10-08 MED ORDER — FENOFIBRATE 160 MG PO TABS: 160.0000 mg | ORAL_TABLET | Freq: Every day | ORAL | 1 refills | Status: DC

## 2022-10-08 MED ORDER — METFORMIN HCL 1000 MG PO TABS: ORAL_TABLET | ORAL | 1 refills | Status: DC

## 2022-10-08 MED ORDER — IPRATROPIUM-ALBUTEROL 0.5-2.5 (3) MG/3ML IN SOLN: RESPIRATORY_TRACT | 0 refills | Status: DC

## 2022-10-08 MED ORDER — GLIPIZIDE 10 MG PO TABS: 10.0000 mg | ORAL_TABLET | Freq: Two times a day (BID) | ORAL | 1 refills | Status: DC

## 2022-10-08 MED ORDER — ATORVASTATIN CALCIUM 40 MG PO TABS: 40.0000 mg | ORAL_TABLET | Freq: Every day | ORAL | 1 refills | Status: DC

## 2022-10-08 MED ORDER — LISINOPRIL-HYDROCHLOROTHIAZIDE 20-25 MG PO TABS: 1.0000 | ORAL_TABLET | Freq: Every day | ORAL | 1 refills | Status: DC

## 2022-10-08 NOTE — Progress Notes (Signed)
Subjective:    Patient ID: Kurt Blevins, male    DOB: Nov 23, 1960, 62 y.o.   MRN: [id number]   Chief Complaint: annual physical   HPI:  Kurt Blevins is a 62 y.o. who identifies as a male who was assigned male at birth.   Social history: Lives with: wife Work history: disability   Comes in today for follow up of the following chronic medical issues:  1. Primary hypertension No c/o chest pain, sob or headache. Does nit check blood pressure at home. BP Readings from Last 3 Encounters:  10/08/22 (!) 147/91  07/09/22 (!) 145/90  04/06/22 (!) 147/86     2. Type 2 diabetes mellitus with diabetic polyneuropathy, without long-term current use of insulin (HCC) Fasting blood sugars are running around 120-150. No low blood sugars Lab Results  Component Value Date   HGBA1C 7.7 (H) 07/09/2022     3. Hyperlipidemia associated with type 2 diabetes mellitus ([place]) Does not watch diet and does no dedicated exercise. Lab Results  Component Value Date   CHOL 149 07/09/2022   HDL 34 (L) 07/09/2022   LDLCALC 68 07/09/2022   TRIG 294 (H) 07/09/2022   CHOLHDL 4.4 07/09/2022     4. Neuropathy due to type 2 diabetes mellitus (HCC) Slight numbness and tingling.  5. COPD with asthma Is on trelogy and duoneb in neb. He is doing well. No increase in SOB. Has not seen pulmonology  in several years. Says he is maintaining.  6. Severe obesity (BMI >= 40) (HCC) No recent weight changes Wt Readings from Last 3 Encounters:  10/08/22 293 lb (132.9 kg)  10/04/22 295 lb (133.8 kg)  07/09/22 292 lb (132.5 kg)   BMI Readings from Last 3 Encounters:  10/08/22 39.74 kg/m  10/04/22 40.01 kg/m  07/09/22 40.73 kg/m      New complaints: None  today  Allergies  Allergen Reactions   Penicillins Anaphylaxis and Other (See Comments)    [place] Has patient had a PCN reaction causing immediate rash, facial/tongue/throat swelling, SOB or lightheadedness with hypotension: Yes Has patient  had a PCN reaction causing severe rash involving mucus membranes or skin necrosis: Yes Has patient had a PCN reaction that required hospitalization: Yes Has patient had a PCN reaction occurring within the last 10 years: No If all of the above answers are "NO", then may proceed with Cephalosporin use.    Egg-Derived Products Nausea Only and Other (See Comments)    Stomach cramps   Peanut-Containing Drug Products Hives and Itching   Outpatient Encounter Medications as of 10/08/2022  Medication Sig   aspirin 81 MG tablet Take 81 mg by mouth daily.   atorvastatin (LIPITOR) 40 MG tablet Take 1 tablet (40 mg total) by mouth daily.   fenofibrate 160 MG tablet Take 1 tablet (160 mg total) by mouth daily.   Fluticasone-Umeclidin-Vilant (TRELEGY ELLIPTA) 100-62.5-25 MCG/ACT AEPB Inhale 1 puff into the lungs daily.   glipiZIDE (GLUCOTROL) 10 MG tablet Take 1 tablet (10 mg total) by mouth 2 (two) times daily before a meal.   glucose blood (ONETOUCH VERIO) test strip Check BS one daily Dx E11.42   ipratropium-albuterol (DUONEB) 0.5-2.5 (3) MG/3ML SOLN USE 1 VIAL VIA NEBULIZER 3 TIMES DAILY AS NEEDED FOR SHORTNESS OF BREATH   lisinopril-hydrochlorothiazide (ZESTORETIC) 20-25 MG tablet Take 1 tablet by mouth daily.   metFORMIN (GLUCOPHAGE) 1000 MG tablet TAKE 1 TABLET TWICE DAILY WITH A MEAL.   OneTouch Delica Lancets [id number] MISC Check blood sugar 1x  per day and prn  E 11.9   No facility-administered encounter medications on file as of 10/08/2022.    Past Surgical History:  Procedure Laterality Date   BACK SURGERY  1990s   COLONOSCOPY N/A 02/25/2018   Procedure: COLONOSCOPY;  Surgeon: Doran Stabler, MD;  Location: [facility];  Service: Gastroenterology;  Laterality: N/A;   HERNIA REPAIR     NECK SURGERY     POLYPECTOMY  02/25/2018   Procedure: POLYPECTOMY;  Surgeon: Doran Stabler, MD;  Location: [facility];  Service: Gastroenterology;;   TONSILLECTOMY      Family History  Problem Relation  Age of Onset   Diabetes Mother    Hypertension Mother    Hyperlipidemia Mother    Arthritis Mother    Osteoporosis Mother    Diabetes Father    Hypertension Father    Hyperlipidemia Father    Heart attack Father 6       Late 33s and 83s   Cancer Paternal Grandmother        colon   Healthy Sister    Healthy Brother    Healthy Daughter    Healthy Son    Healthy Son    Healthy Son       Controlled substance contract: n/a     Review of Systems  Constitutional:  Negative for diaphoresis.  Eyes:  Negative for pain.  Respiratory:  Positive for cough and shortness of breath.   Cardiovascular:  Negative for chest pain, palpitations and leg swelling.  Gastrointestinal:  Negative for abdominal pain.  Endocrine: Negative for polydipsia.  Skin:  Negative for rash.  Neurological:  Negative for dizziness, weakness and headaches.  Hematological:  Does not bruise/bleed easily.  All other systems reviewed and are negative.      Objective:   Physical Exam Vitals and nursing note reviewed.  Constitutional:      Appearance: Normal appearance. He is well-developed.  HENT:     Head: Normocephalic.     Nose: Nose normal.     Mouth/Throat:     Mouth: Mucous membranes are moist.     Pharynx: Oropharynx is clear.  Eyes:     Pupils: Pupils are equal, round, and reactive to light.  Neck:     Thyroid: No thyroid mass or thyromegaly.     Vascular: No carotid bruit or JVD.     Trachea: Phonation normal.  Cardiovascular:     Rate and Rhythm: Normal rate and regular rhythm.  Pulmonary:     Effort: Pulmonary effort is normal. No respiratory distress.     Breath sounds: Normal breath sounds.  Abdominal:     General: Bowel sounds are normal.     Palpations: Abdomen is soft.     Tenderness: There is no abdominal tenderness.  Musculoskeletal:        General: Normal range of motion.     Cervical back: Normal range of motion and neck supple.  Lymphadenopathy:     Cervical: No cervical  adenopathy.  Skin:    General: Skin is warm and dry.  Neurological:     Mental Status: He is alert and oriented to person, place, and time.  Psychiatric:        Behavior: Behavior normal.        Thought Content: Thought content normal.        Judgment: Judgment normal.    BP (!) 147/91   Pulse 71   Temp (!) 97.2 F (36.2 C) (Temporal)  Resp 20   Ht 6' (1.829 m)   Wt 293 lb (132.9 kg)   SpO2 95%   BMI 39.74 kg/m   EKG- NSR-Preliminary reading by Ronnald Collum, FNP  [facility] Chest xray- chronic bronchitic changes  HGBA1c 7.5%      Assessment & Plan:   Kurt Blevins comes in today with chief complaint of Medical Management of Chronic Issues   Diagnosis and orders addressed:  1. Annual physical exam   2. Primary hypertension Low sodium diet - CBC with Differential/Platelet - CMP14+EGFR - lisinopril-hydrochlorothiazide (ZESTORETIC) 20-25 MG tablet; Take 1 tablet by mouth daily.  Dispense: 90 tablet; Refill: 1 - DG Chest 2 View - EKG 12-Lead  3. Type 2 diabetes mellitus with diabetic polyneuropathy, without long-term current use of insulin (HCC) Strict carb counting - Bayer DCA Hb A1c Waived - glipiZIDE (GLUCOTROL) 10 MG tablet; Take 1 tablet (10 mg total) by mouth 2 (two) times daily before a meal.  Dispense: 180 tablet; Refill: 1 - metFORMIN (GLUCOPHAGE) 1000 MG tablet; TAKE 1 TABLET TWICE DAILY WITH A MEAL.  Dispense: 180 tablet; Refill: 1  4. Hyperlipidemia associated with type 2 diabetes mellitus (HCC) Low fat diet - Lipid panel - atorvastatin (LIPITOR) 40 MG tablet; Take 1 tablet (40 mg total) by mouth daily.  Dispense: 90 tablet; Refill: 1 - fenofibrate 160 MG tablet; Take 1 tablet (160 mg total) by mouth daily.  Dispense: 90 tablet; Refill: 1  5. Neuropathy due to type 2 diabetes mellitus ([place]) Do not go bare footed Check feet daily  6. COPD with asthma Continue inhalers - ipratropium-albuterol (DUONEB) 0.5-2.5 (3) MG/3ML SOLN; USE 1 VIAL VIA NEBULIZER 3  TIMES DAILY AS NEEDED FOR SHORTNESS OF BREATH  Dispense: 180 mL; Refill: 0  7. Severe obesity (BMI >= 40) (HCC) Discussed diet and exercise for person with BMI >25 Will recheck weight in 3-6 months    Labs pending Health Maintenance reviewed Diet and exercise encouraged  Follow up plan: 3 months   Mary-Margaret Hassell Done, FNP

## 2022-10-08 NOTE — Patient Instructions (Signed)
Chronic Obstructive Pulmonary Disease  Chronic obstructive pulmonary disease (COPD) is a long-term (chronic) lung problem. When you have COPD, it is hard for air to get in and out of your lungs. Usually the condition gets worse over time, and your lungs will never return to normal. There are things you can do to keep yourself as healthy as possible. What are the causes? Smoking. This is the most common cause. Certain genes passed from parent to child (inherited). What increases the risk? Being exposed to secondhand smoke from cigarettes, pipes, or cigars. Being exposed to chemicals and other irritants, such as fumes and dust in the work environment. Having chronic lung conditions or infections. What are the signs or symptoms? Shortness of breath, especially during physical activity. A long-term cough with a large amount of thick mucus. Sometimes, the cough may not have any mucus (dry cough). Wheezing. Breathing quickly. Skin that looks gray or blue, especially in the fingers, toes, or lips. Feeling tired (fatigue). Weight loss. Chest tightness. Having infections often. Episodes when breathing symptoms become much worse (exacerbations). At the later stages of this disease, you may have swelling in the ankles, feet, or legs. How is this treated? Taking medicines. Quitting smoking, if you smoke. Rehabilitation. This includes steps to make your body work better. It may involve a team of specialists. Doing exercises. Making changes to your diet. Using oxygen. Lung surgery. Lung transplant. Comfort measures (palliative care). Follow these instructions at home: Medicines Take over-the-counter and prescription medicines only as told by your doctor. Talk to your doctor before taking any cough or allergy medicines. You may need to avoid medicines that cause your lungs to be dry. Lifestyle If you smoke, stop smoking. Smoking makes the problem worse. Do not smoke or use any products that  contain nicotine or tobacco. If you need help quitting, ask your doctor. Avoid being around things that make your breathing worse. This may include smoke, chemicals, and fumes. Stay active, but remember to rest as well. Learn and use tips on how to manage stress and control your breathing. Make sure you get enough sleep. Most adults need at least 7 hours of sleep every night. Eat healthy foods. Eat smaller meals more often. Rest before meals. Controlled breathing Learn and use tips on how to control your breathing as told by your doctor. Try: Breathing in (inhaling) through your nose for 1 second. Then, pucker your lips and breath out (exhale) through your lips for 2 seconds. Putting one hand on your belly (abdomen). Breathe in slowly through your nose for 1 second. Your hand on your belly should move out. Pucker your lips and breathe out slowly through your lips. Your hand on your belly should move in as you breathe out.  Controlled coughing Learn and use controlled coughing to clear mucus from your lungs. Follow these steps: Lean your head a little forward. Breathe in deeply. Try to hold your breath for 3 seconds. Keep your mouth slightly open while coughing 2 times. Spit any mucus out into a tissue. Rest and do the steps again 1 or 2 times as needed. General instructions Make sure you get all the shots (vaccines) that your doctor recommends. Ask your doctor about a flu shot and a pneumonia shot. Use oxygen therapy and pulmonary rehabilitation if told by your doctor. If you need home oxygen therapy, ask your doctor if you should buy a tool to measure your oxygen level (oximeter). Make a COPD action plan with your doctor. This helps you   to know what to do if you feel worse than usual. Manage any other conditions you have as told by your doctor. Avoid going outside when it is very hot, cold, or humid. Avoid people who have a sickness you can catch (contagious). Keep all follow-up  visits. Contact a doctor if: You cough up more mucus than usual. There is a change in the color or thickness of the mucus. It is harder to breathe than usual. Your breathing is faster than usual. You have trouble sleeping. You need to use your medicines more often than usual. You have trouble doing your normal activities such as getting dressed or walking around the house. Get help right away if: You have shortness of breath while resting. You have shortness of breath that stops you from: Being able to talk. Doing normal activities. Your chest hurts for longer than 5 minutes. Your skin color is more blue than usual. Your pulse oximeter shows that you have low oxygen for longer than 5 minutes. You have a fever. You feel too tired to breathe normally. These symptoms may represent a serious problem that is an emergency. Do not wait to see if the symptoms will go away. Get medical help right away. Call your local emergency services (911 in the [place] Do not drive yourself to the hospital. Summary Chronic obstructive pulmonary disease (COPD) is a long-term lung problem. The way your lungs work will never return to normal. Usually the condition gets worse over time. There are things you can do to keep yourself as healthy as possible. Take over-the-counter and prescription medicines only as told by your doctor. If you smoke, stop. Smoking makes the problem worse. This information is not intended to replace advice given to you by your health care provider. Make sure you discuss any questions you have with your health care provider. Document Revised: 05/17/2020 Document Reviewed: 05/17/2020 Elsevier Patient Education  2023 Elsevier Inc.  

## 2022-10-09 LAB — CBC WITH DIFFERENTIAL/PLATELET
Basophils Absolute: 0.1 10*3/uL (ref 0.0–0.2)
Basos: 1 %
EOS (ABSOLUTE): 0.4 10*3/uL (ref 0.0–0.4)
Eos: 5 %
Hematocrit: 43.1 % (ref 37.5–51.0)
Hemoglobin: 14.9 g/dL (ref 13.0–17.7)
Immature Grans (Abs): 0 10*3/uL (ref 0.0–0.1)
Immature Granulocytes: 0 %
Lymphocytes Absolute: 2.7 10*3/uL (ref 0.7–3.1)
Lymphs: 32 %
MCH: 29.6 pg (ref 26.6–33.0)
MCHC: 34.6 g/dL (ref 31.5–35.7)
MCV: 86 fL (ref 79–97)
Monocytes Absolute: 0.8 10*3/uL (ref 0.1–0.9)
Monocytes: 9 %
Neutrophils Absolute: 4.4 10*3/uL (ref 1.4–7.0)
Neutrophils: 53 %
Platelets: 268 10*3/uL (ref 150–450)
RBC: 5.04 x10E6/uL (ref 4.14–5.80)
RDW: 13 % (ref 11.6–15.4)
WBC: 8.3 10*3/uL (ref 3.4–10.8)

## 2022-10-09 LAB — CMP14+EGFR
ALT: 21 IU/L (ref 0–44)
AST: 22 IU/L (ref 0–40)
Albumin/Globulin Ratio: 2.1 (ref 1.2–2.2)
Albumin: 4.5 g/dL (ref 3.8–4.9)
Alkaline Phosphatase: 63 IU/L (ref 44–121)
BUN/Creatinine Ratio: 15 (ref 10–24)
BUN: 17 mg/dL (ref 8–27)
Bilirubin Total: 0.3 mg/dL (ref 0.0–1.2)
CO2: 22 mmol/L (ref 20–29)
Calcium: 9.7 mg/dL (ref 8.6–10.2)
Chloride: 103 mmol/L (ref 96–106)
Creatinine, Ser: 1.15 mg/dL (ref 0.76–1.27)
Globulin, Total: 2.1 g/dL (ref 1.5–4.5)
Glucose: 58 mg/dL — ABNORMAL LOW (ref 70–99)
Potassium: 4.2 mmol/L (ref 3.5–5.2)
Sodium: 142 mmol/L (ref 134–144)
Total Protein: 6.6 g/dL (ref 6.0–8.5)
eGFR: 73 mL/min/{1.73_m2} (ref >59)

## 2022-10-09 LAB — LIPID PANEL
Chol/HDL Ratio: 3.8 ratio (ref 0.0–5.0)
Cholesterol, Total: 141 mg/dL (ref 100–199)
HDL: 37 mg/dL — ABNORMAL LOW (ref >39)
LDL Chol Calc (NIH): 67 mg/dL (ref 0–99)
Triglycerides: 224 mg/dL — ABNORMAL HIGH (ref 0–149)
VLDL Cholesterol Cal: 37 mg/dL (ref 5–40)

## 2022-10-26 ENCOUNTER — Telehealth: Payer: Self-pay | Admitting: Nurse Practitioner

## 2022-10-26 NOTE — Telephone Encounter (Signed)
REFERRAL REQUEST Telephone Note  Have you been seen at our office for this problem? yes (Advise that they may need an appointment with their PCP before a referral can be done)  Reason for Referral: coloscopy Referral discussed with patient: yes  Best contact number of patient for referral team: [phone]    Has patient been seen by a specialist for this issue before: yes  Patient provider preference for referral: pt wants to go to the last provider he went to does not remember the name only that it was in [place] Patient location preference for referral: pt wants to go to the last provider he went to does not remember the name only that it was in [place]   Patient notified that referrals can take up to a week or longer to process. If they haven't heard anything within a week they should call back and speak with the referral department.

## 2022-12-07 ENCOUNTER — Other Ambulatory Visit: Payer: Self-pay | Admitting: Nurse Practitioner

## 2022-12-07 DIAGNOSIS — J4489 Other specified chronic obstructive pulmonary disease: Secondary | ICD-10-CM

## 2023-01-07 ENCOUNTER — Ambulatory Visit (INDEPENDENT_AMBULATORY_CARE_PROVIDER_SITE_OTHER): Payer: Medicare Other | Admitting: Nurse Practitioner

## 2023-01-07 ENCOUNTER — Encounter: Payer: Self-pay | Admitting: Nurse Practitioner

## 2023-01-07 VITALS — BP 130/82 | HR 60 | Temp 97.8°F | Resp 20 | Ht 72.0 in | Wt 292.0 lb

## 2023-01-07 DIAGNOSIS — E1140 Type 2 diabetes mellitus with diabetic neuropathy, unspecified: Secondary | ICD-10-CM | POA: Diagnosis not present

## 2023-01-07 DIAGNOSIS — E1169 Type 2 diabetes mellitus with other specified complication: Secondary | ICD-10-CM | POA: Diagnosis not present

## 2023-01-07 DIAGNOSIS — J4489 Other specified chronic obstructive pulmonary disease: Secondary | ICD-10-CM

## 2023-01-07 DIAGNOSIS — E119 Type 2 diabetes mellitus without complications: Secondary | ICD-10-CM

## 2023-01-07 DIAGNOSIS — Z7984 Long term (current) use of oral hypoglycemic drugs: Secondary | ICD-10-CM

## 2023-01-07 DIAGNOSIS — E785 Hyperlipidemia, unspecified: Secondary | ICD-10-CM

## 2023-01-07 DIAGNOSIS — I10 Essential (primary) hypertension: Secondary | ICD-10-CM | POA: Diagnosis not present

## 2023-01-07 DIAGNOSIS — E1142 Type 2 diabetes mellitus with diabetic polyneuropathy: Secondary | ICD-10-CM | POA: Diagnosis not present

## 2023-01-07 LAB — BAYER DCA HB A1C WAIVED: HB A1C (BAYER DCA - WAIVED): 7.8 % — ABNORMAL HIGH (ref 4.8–5.6)

## 2023-01-07 MED ORDER — IPRATROPIUM-ALBUTEROL 0.5-2.5 (3) MG/3ML IN SOLN
RESPIRATORY_TRACT | 1 refills | Status: DC
Start: 2023-01-07 — End: 2023-03-07

## 2023-01-07 MED ORDER — IPRATROPIUM-ALBUTEROL 0.5-2.5 (3) MG/3ML IN SOLN
RESPIRATORY_TRACT | 1 refills | Status: DC
Start: 2023-01-07 — End: 2023-01-07

## 2023-01-07 MED ORDER — TRELEGY ELLIPTA 100-62.5-25 MCG/ACT IN AEPB
1.0000 | INHALATION_SPRAY | Freq: Every day | RESPIRATORY_TRACT | 11 refills | Status: DC
Start: 2023-01-07 — End: 2024-01-20

## 2023-01-07 NOTE — Progress Notes (Signed)
Subjective:    Patient ID: Kurt Blevins, male    DOB: Oct 31, 1960, 62 y.o.   MRN: [id number]   Chief Complaint: Medical Management of Chronic Issues (Bilateral hand pain/)    HPI:  Kurt Blevins is a 62 y.o. who identifies as a male who was assigned male at birth.   Social history: Lives with: wife Work history: disability   Comes in today for follow up of the following chronic medical issues:  1. Primary hypertension No c/o chest pain, sob or headache. Doe snot check blood pressure at home. BP Readings from Last 3 Encounters:  01/07/23 130/82  10/08/22 (!) 147/91  07/09/22 (!) 145/90     2. Diabetes mellitus treated with oral medication (HCC) Fasting blood sugars are running around 130-180. No low blood sugars Lab Results  Component Value Date   HGBA1C 7.5 (H) 10/08/2022     3. Hyperlipidemia associated with type 2 diabetes mellitus (HCC) Does not watch diet and does no exercise at all. Lab Results  Component Value Date   CHOL 141 10/08/2022   HDL 37 (L) 10/08/2022   LDLCALC 67 10/08/2022   TRIG 224 (H) 10/08/2022   CHOLHDL 3.8 10/08/2022      4. COPD with asthma Has occasional wheezing  5. Neuropathy due to type 2 diabetes mellitus (HCC) Has burning and numbness in bil feet.  6. Severe obesity (BMI >= 40) (HCC) No recent weight changes Wt Readings from Last 3 Encounters:  01/07/23 292 lb (132.5 kg)  10/08/22 293 lb (132.9 kg)  10/04/22 295 lb (133.8 kg)   BMI Readings from Last 3 Encounters:  01/07/23 39.60 kg/m  10/08/22 39.74 kg/m  10/04/22 40.01 kg/m      New complaints: Bil hand pain. Thumb index and ring finger go numb. Does not bother him all the time.  Allergies  Allergen Reactions   Penicillins Anaphylaxis and Other (See Comments)    Whelps Has patient had a PCN reaction causing immediate rash, facial/tongue/throat swelling, SOB or lightheadedness with hypotension: Yes Has patient had a PCN reaction causing severe rash  involving mucus membranes or skin necrosis: Yes Has patient had a PCN reaction that required hospitalization: Yes Has patient had a PCN reaction occurring within the last 10 years: No If all of the above answers are "NO", then may proceed with Cephalosporin use.    Egg-Derived Products Nausea Only and Other (See Comments)    Stomach cramps   Peanut-Containing Drug Products Hives and Itching   Outpatient Encounter Medications as of 01/07/2023  Medication Sig   aspirin 81 MG tablet Take 81 mg by mouth daily.   atorvastatin (LIPITOR) 40 MG tablet Take 1 tablet (40 mg total) by mouth daily.   fenofibrate 160 MG tablet Take 1 tablet (160 mg total) by mouth daily.   Fluticasone-Umeclidin-Vilant (TRELEGY ELLIPTA) 100-62.5-25 MCG/ACT AEPB Inhale 1 puff into the lungs daily.   glipiZIDE (GLUCOTROL) 10 MG tablet Take 1 tablet (10 mg total) by mouth 2 (two) times daily before a meal.   glucose blood (ONETOUCH VERIO) test strip Check BS one daily Dx E11.42   ipratropium-albuterol (DUONEB) 0.5-2.5 (3) MG/3ML SOLN USE 1 VIAL VIA NEBULIZER 3 TIMES DAILY AS NEEDED FOR SHORTNESS OF BREATH   lisinopril-hydrochlorothiazide (ZESTORETIC) 20-25 MG tablet Take 1 tablet by mouth daily.   metFORMIN (GLUCOPHAGE) 1000 MG tablet TAKE 1 TABLET TWICE DAILY WITH A MEAL.   OneTouch Delica Lancets 33G MISC Check blood sugar 1x per day and prn  E  11.9   No facility-administered encounter medications on file as of 01/07/2023.    Past Surgical History:  Procedure Laterality Date   BACK SURGERY  1990s   COLONOSCOPY N/A 02/25/2018   Procedure: COLONOSCOPY;  Surgeon: Sherrilyn Rist, MD;  Location: [facility];  Service: Gastroenterology;  Laterality: N/A;   HERNIA REPAIR     NECK SURGERY     POLYPECTOMY  02/25/2018   Procedure: POLYPECTOMY;  Surgeon: Sherrilyn Rist, MD;  Location: [facility];  Service: Gastroenterology;;   TONSILLECTOMY      Family History  Problem Relation Age of Onset   Diabetes Mother     Hypertension Mother    Hyperlipidemia Mother    Arthritis Mother    Osteoporosis Mother    Diabetes Father    Hypertension Father    Hyperlipidemia Father    Heart attack Father 62       Late 67s and 107s   Cancer Paternal Grandmother        colon   Healthy Sister    Healthy Brother    Healthy Daughter    Healthy Son    Healthy Son    Healthy Son       Controlled substance contract: n/a     Review of Systems  Constitutional:  Negative for diaphoresis.  Eyes:  Negative for pain.  Respiratory:  Negative for shortness of breath.   Cardiovascular:  Negative for chest pain, palpitations and leg swelling.  Gastrointestinal:  Negative for abdominal pain.  Endocrine: Negative for polydipsia.  Skin:  Negative for rash.  Neurological:  Negative for dizziness, weakness and headaches.  Hematological:  Does not bruise/bleed easily.  All other systems reviewed and are negative.      Objective:   Physical Exam Vitals and nursing note reviewed.  Constitutional:      Appearance: Normal appearance. He is well-developed.  HENT:     Head: Normocephalic.     Nose: Nose normal.     Mouth/Throat:     Mouth: Mucous membranes are moist.     Pharynx: Oropharynx is clear.  Eyes:     Pupils: Pupils are equal, round, and reactive to light.  Neck:     Thyroid: No thyroid mass or thyromegaly.     Vascular: No carotid bruit or JVD.     Trachea: Phonation normal.  Cardiovascular:     Rate and Rhythm: Normal rate and regular rhythm.  Pulmonary:     Effort: Pulmonary effort is normal. No respiratory distress.     Breath sounds: Normal breath sounds.  Abdominal:     General: Bowel sounds are normal.     Palpations: Abdomen is soft.     Tenderness: There is no abdominal tenderness.  Musculoskeletal:        General: Normal range of motion.     Cervical back: Normal range of motion and neck supple.     Comments: Positive phalen test and Tinel bil  Lymphadenopathy:     Cervical: No  cervical adenopathy.  Skin:    General: Skin is warm and dry.  Neurological:     Mental Status: He is alert and oriented to person, place, and time.  Psychiatric:        Behavior: Behavior normal.        Thought Content: Thought content normal.        Judgment: Judgment normal.    BP 130/82   Pulse 60   Temp 97.8 F (36.6 C) (Temporal)  Resp 20   Ht 6' (1.829 m)   Wt 292 lb (132.5 kg)   SpO2 91%   BMI 39.60 kg/m    Hgba1c 7.8%     Assessment & Plan:   Kurt Blevins comes in today with chief complaint of Medical Management of Chronic Issues (Bilateral hand pain/)   Diagnosis and orders addressed:  1. Primary hypertension Low sodium diet - CBC with Differential/Platelet - CMP14+EGFR  2. Diabetes mellitus treated with oral medication (HCC) Stricter carbs counting - Bayer DCA Hb A1c Waived  3. Hyperlipidemia associated with type 2 diabetes mellitus (HCC) Low fat diet - Lipid panel  4. COPD with asthma - Fluticasone-Umeclidin-Vilant (TRELEGY ELLIPTA) 100-62.5-25 MCG/ACT AEPB; Inhale 1 puff into the lungs daily.  Dispense: 1 each; Refill: 11 - ipratropium-albuterol (DUONEB) 0.5-2.5 (3) MG/3ML SOLN; USE 1 VIAL VIA NEBULIZER 3 TIMES DAILY AS NEEDED FOR SHORTNESS OF BREATH  Dispense: 180 mL; Refill: 1  5. Neuropathy due to type 2 diabetes mellitus (HCC) Check feet daily  6. Severe obesity (BMI >= 40) (HCC) Discussed diet and exercise for person with BMI >25 Will recheck weight in 3-6 months  7. Bil carpal tunnel Patient doe snot want  to do anything right now  Labs pending Health Maintenance reviewed Diet and exercise encouraged  Follow up plan: 3 months   Mary-Margaret Daphine Deutscher, FNP

## 2023-01-08 LAB — LIPID PANEL
Chol/HDL Ratio: 3.8 ratio (ref 0.0–5.0)
Cholesterol, Total: 133 mg/dL (ref 100–199)
HDL: 35 mg/dL — ABNORMAL LOW (ref >39)
LDL Chol Calc (NIH): 57 mg/dL (ref 0–99)
Triglycerides: 255 mg/dL — ABNORMAL HIGH (ref 0–149)
VLDL Cholesterol Cal: 41 mg/dL — ABNORMAL HIGH (ref 5–40)

## 2023-01-08 LAB — CMP14+EGFR
ALT: 15 IU/L (ref 0–44)
AST: 14 IU/L (ref 0–40)
Albumin: 4.4 g/dL (ref 3.9–4.9)
Alkaline Phosphatase: 58 IU/L (ref 44–121)
BUN/Creatinine Ratio: 16 (ref 10–24)
BUN: 18 mg/dL (ref 8–27)
Bilirubin Total: 0.4 mg/dL (ref 0.0–1.2)
CO2: 24 mmol/L (ref 20–29)
Calcium: 10.5 mg/dL — ABNORMAL HIGH (ref 8.6–10.2)
Chloride: 101 mmol/L (ref 96–106)
Creatinine, Ser: 1.15 mg/dL (ref 0.76–1.27)
Globulin, Total: 2.2 g/dL (ref 1.5–4.5)
Glucose: 80 mg/dL (ref 70–99)
Potassium: 4 mmol/L (ref 3.5–5.2)
Sodium: 140 mmol/L (ref 134–144)
Total Protein: 6.6 g/dL (ref 6.0–8.5)
eGFR: 72 mL/min/{1.73_m2} (ref >59)

## 2023-01-08 LAB — CBC WITH DIFFERENTIAL/PLATELET
Basophils Absolute: 0.1 10*3/uL (ref 0.0–0.2)
Basos: 1 %
EOS (ABSOLUTE): 0.3 10*3/uL (ref 0.0–0.4)
Eos: 3 %
Hematocrit: 45.8 % (ref 37.5–51.0)
Hemoglobin: 15.5 g/dL (ref 13.0–17.7)
Immature Grans (Abs): 0 10*3/uL (ref 0.0–0.1)
Immature Granulocytes: 0 %
Lymphocytes Absolute: 3.1 10*3/uL (ref 0.7–3.1)
Lymphs: 33 %
MCH: 29.5 pg (ref 26.6–33.0)
MCHC: 33.8 g/dL (ref 31.5–35.7)
MCV: 87 fL (ref 79–97)
Monocytes Absolute: 0.9 10*3/uL (ref 0.1–0.9)
Monocytes: 9 %
Neutrophils Absolute: 5.2 10*3/uL (ref 1.4–7.0)
Neutrophils: 54 %
Platelets: 274 10*3/uL (ref 150–450)
RBC: 5.25 x10E6/uL (ref 4.14–5.80)
RDW: 13 % (ref 11.6–15.4)
WBC: 9.5 10*3/uL (ref 3.4–10.8)

## 2023-01-21 LAB — HM DIABETES EYE EXAM

## 2023-03-07 ENCOUNTER — Other Ambulatory Visit: Payer: Self-pay | Admitting: Nurse Practitioner

## 2023-03-07 DIAGNOSIS — J4489 Other specified chronic obstructive pulmonary disease: Secondary | ICD-10-CM

## 2023-03-29 ENCOUNTER — Other Ambulatory Visit: Payer: Self-pay | Admitting: Nurse Practitioner

## 2023-03-29 DIAGNOSIS — E1169 Type 2 diabetes mellitus with other specified complication: Secondary | ICD-10-CM

## 2023-03-29 DIAGNOSIS — E1142 Type 2 diabetes mellitus with diabetic polyneuropathy: Secondary | ICD-10-CM

## 2023-03-29 DIAGNOSIS — I10 Essential (primary) hypertension: Secondary | ICD-10-CM

## 2023-04-19 ENCOUNTER — Ambulatory Visit: Payer: Medicare Other | Admitting: Nurse Practitioner

## 2023-04-23 ENCOUNTER — Ambulatory Visit (INDEPENDENT_AMBULATORY_CARE_PROVIDER_SITE_OTHER): Payer: Medicare Other | Admitting: Nurse Practitioner

## 2023-04-23 ENCOUNTER — Encounter: Payer: Self-pay | Admitting: Nurse Practitioner

## 2023-04-23 VITALS — BP 137/89 | HR 99 | Temp 96.9°F | Resp 20 | Ht 72.0 in | Wt 297.0 lb

## 2023-04-23 DIAGNOSIS — E785 Hyperlipidemia, unspecified: Secondary | ICD-10-CM | POA: Diagnosis not present

## 2023-04-23 DIAGNOSIS — E1169 Type 2 diabetes mellitus with other specified complication: Secondary | ICD-10-CM | POA: Diagnosis not present

## 2023-04-23 DIAGNOSIS — E1142 Type 2 diabetes mellitus with diabetic polyneuropathy: Secondary | ICD-10-CM | POA: Diagnosis not present

## 2023-04-23 DIAGNOSIS — J4489 Other specified chronic obstructive pulmonary disease: Secondary | ICD-10-CM | POA: Diagnosis not present

## 2023-04-23 DIAGNOSIS — I10 Essential (primary) hypertension: Secondary | ICD-10-CM | POA: Diagnosis not present

## 2023-04-23 DIAGNOSIS — E1140 Type 2 diabetes mellitus with diabetic neuropathy, unspecified: Secondary | ICD-10-CM | POA: Diagnosis not present

## 2023-04-23 DIAGNOSIS — Z7984 Long term (current) use of oral hypoglycemic drugs: Secondary | ICD-10-CM | POA: Diagnosis not present

## 2023-04-23 DIAGNOSIS — I480 Paroxysmal atrial fibrillation: Secondary | ICD-10-CM | POA: Diagnosis not present

## 2023-04-23 DIAGNOSIS — G5603 Carpal tunnel syndrome, bilateral upper limbs: Secondary | ICD-10-CM

## 2023-04-23 LAB — CMP14+EGFR
ALT: 19 [IU]/L (ref 0–44)
AST: 16 [IU]/L (ref 0–40)
Albumin: 4.4 g/dL (ref 3.9–4.9)
Alkaline Phosphatase: 66 [IU]/L (ref 44–121)
BUN/Creatinine Ratio: 13 (ref 10–24)
BUN: 15 mg/dL (ref 8–27)
Bilirubin Total: 0.7 mg/dL (ref 0.0–1.2)
CO2: 22 mmol/L (ref 20–29)
Calcium: 9.5 mg/dL (ref 8.6–10.2)
Chloride: 100 mmol/L (ref 96–106)
Creatinine, Ser: 1.13 mg/dL (ref 0.76–1.27)
Globulin, Total: 1.9 g/dL (ref 1.5–4.5)
Glucose: 198 mg/dL — ABNORMAL HIGH (ref 70–99)
Potassium: 4.2 mmol/L (ref 3.5–5.2)
Sodium: 139 mmol/L (ref 134–144)
Total Protein: 6.3 g/dL (ref 6.0–8.5)
eGFR: 74 mL/min/{1.73_m2} (ref >59)

## 2023-04-23 LAB — CBC WITH DIFFERENTIAL/PLATELET
Basophils Absolute: 0.1 10*3/uL (ref 0.0–0.2)
Basos: 0 %
EOS (ABSOLUTE): 0.5 10*3/uL — ABNORMAL HIGH (ref 0.0–0.4)
Eos: 4 %
Hematocrit: 45.3 % (ref 37.5–51.0)
Hemoglobin: 14.9 g/dL (ref 13.0–17.7)
Immature Grans (Abs): 0.1 10*3/uL (ref 0.0–0.1)
Immature Granulocytes: 1 %
Lymphocytes Absolute: 2.9 10*3/uL (ref 0.7–3.1)
Lymphs: 22 %
MCH: 29.4 pg (ref 26.6–33.0)
MCHC: 32.9 g/dL (ref 31.5–35.7)
MCV: 89 fL (ref 79–97)
Monocytes Absolute: 0.9 10*3/uL (ref 0.1–0.9)
Monocytes: 7 %
Neutrophils Absolute: 8.9 10*3/uL — ABNORMAL HIGH (ref 1.4–7.0)
Neutrophils: 66 %
Platelets: 264 10*3/uL (ref 150–450)
RBC: 5.07 x10E6/uL (ref 4.14–5.80)
RDW: 12.8 % (ref 11.6–15.4)
WBC: 13.3 10*3/uL — ABNORMAL HIGH (ref 3.4–10.8)

## 2023-04-23 LAB — LIPID PANEL
Chol/HDL Ratio: 3.8 {ratio} (ref 0.0–5.0)
Cholesterol, Total: 125 mg/dL (ref 100–199)
HDL: 33 mg/dL — ABNORMAL LOW (ref >39)
LDL Chol Calc (NIH): 54 mg/dL (ref 0–99)
Triglycerides: 241 mg/dL — ABNORMAL HIGH (ref 0–149)
VLDL Cholesterol Cal: 38 mg/dL (ref 5–40)

## 2023-04-23 LAB — BAYER DCA HB A1C WAIVED: HB A1C (BAYER DCA - WAIVED): 7.2 % — ABNORMAL HIGH (ref 4.8–5.6)

## 2023-04-23 MED ORDER — METFORMIN HCL 1000 MG PO TABS: ORAL_TABLET | ORAL | 1 refills | Status: DC

## 2023-04-23 MED ORDER — GLIPIZIDE 5 MG PO TABS
5.0000 mg | ORAL_TABLET | Freq: Two times a day (BID) | ORAL | 1 refills | Status: DC
Start: 2023-04-23 — End: 2023-10-22

## 2023-04-23 MED ORDER — LISINOPRIL-HYDROCHLOROTHIAZIDE 20-25 MG PO TABS
1.0000 | ORAL_TABLET | Freq: Every day | ORAL | 1 refills | Status: DC
Start: 2023-04-23 — End: 2023-10-22

## 2023-04-23 MED ORDER — FENOFIBRATE 160 MG PO TABS: 160.0000 mg | ORAL_TABLET | Freq: Every day | ORAL | 1 refills | Status: DC

## 2023-04-23 MED ORDER — ATORVASTATIN CALCIUM 40 MG PO TABS
40.0000 mg | ORAL_TABLET | Freq: Every day | ORAL | 1 refills | Status: DC
Start: 2023-04-23 — End: 2023-10-22

## 2023-04-23 MED ORDER — METOPROLOL SUCCINATE ER 25 MG PO TB24
25.0000 mg | ORAL_TABLET | Freq: Every day | ORAL | 3 refills | Status: DC
Start: 2023-04-23 — End: 2023-05-06

## 2023-04-23 NOTE — Progress Notes (Addendum)
Subjective:    Patient ID: GLENDELL FOUSE, male    DOB: [birth year], 61 y.o.   MRN: [id number]   Chief Complaint: medical management of chronic issues     HPI:  KLINE BULTHUIS is a 62 y.o. who identifies as a male who was assigned male at birth.   Social history: Lives with: wife Work history: disability   Comes in today for follow up of the following chronic medical issues:  1. Primary hypertension No c/o chest pain, sob or headache. Does not check blood pressure at home. BP Readings from Last 3 Encounters:  01/07/23 130/82  10/08/22 (!) 147/91  07/09/22 (!) 145/90     2. Hyperlipidemia associated with type 2 diabetes mellitus (HCC) Does not watch diet very closely and does no dedicated exercise. Lab Results  Component Value Date   CHOL 133 01/07/2023   HDL 35 (L) 01/07/2023   LDLCALC 57 01/07/2023   TRIG 255 (H) 01/07/2023   CHOLHDL 3.8 01/07/2023     3. Type 2 diabetes mellitus with diabetic polyneuropathy, without long-term current use of insulin (HCC) Fasting blood sugars are running around 130-150. Lab Results  Component Value Date   HGBA1C 7.8 (H) 01/07/2023     4. Neuropathy due to type 2 diabetes mellitus (HCC) Feet stay numb with ocassional burning  5. COPD with asthma (HCC) No cough  6. Severe obesity (BMI >= 40) (HCC) Weight is up 5 lbs Wt Readings from Last 3 Encounters:  04/23/23 297 lb (134.7 kg)  01/07/23 292 lb (132.5 kg)  10/08/22 293 lb (132.9 kg)   BMI Readings from Last 3 Encounters:  04/23/23 40.28 kg/m  01/07/23 39.60 kg/m  10/08/22 39.74 kg/m      New complaints: Has carpal  tunnel in bil hands and wants to see ortho  Allergies  Allergen Reactions   Penicillins Anaphylaxis and Other (See Comments)    Whelps Has patient had a PCN reaction causing immediate rash, facial/tongue/throat swelling, SOB or lightheadedness with hypotension: Yes Has patient had a PCN reaction causing severe rash involving mucus membranes  or skin necrosis: Yes Has patient had a PCN reaction that required hospitalization: Yes Has patient had a PCN reaction occurring within the last 10 years: No If all of the above answers are "NO", then may proceed with Cephalosporin use.    Egg-Derived Products Nausea Only and Other (See Comments)    Stomach cramps   Peanut-Containing Drug Products Hives and Itching   Outpatient Encounter Medications as of 04/23/2023  Medication Sig   aspirin 81 MG tablet Take 81 mg by mouth daily.   atorvastatin (LIPITOR) 40 MG tablet TAKE ONE TABLET BY MOUTH DAILY.   fenofibrate 160 MG tablet TAKE ONE TABLET BY MOUTH DAILY.   Fluticasone-Umeclidin-Vilant (TRELEGY ELLIPTA) 100-62.5-25 MCG/ACT AEPB Inhale 1 puff into the lungs daily.   glipiZIDE (GLUCOTROL) 5 MG tablet TAKE ONE TABLET BY MOUTH TWICE DAILY before a meal.   glucose blood (ONETOUCH VERIO) test strip Check BS one daily Dx E11.42   ipratropium-albuterol (DUONEB) 0.5-2.5 (3) MG/3ML SOLN USE 1 VIAL VIA NEBULIZER 3 TIMES DAILY AS NEEDED FOR SHORTNESS OF BREATH   lisinopril-hydrochlorothiazide (ZESTORETIC) 20-25 MG tablet TAKE ONE TABLET BY MOUTH DAILY.   metFORMIN (GLUCOPHAGE) 1000 MG tablet TAKE ONE TABLET TWICE DAILY WITH A MEAL.   OneTouch Delica Lancets 33G MISC Check blood sugar 1x per day and prn  E 11.9   No facility-administered encounter medications on file as of 04/23/2023.  Past Surgical History:  Procedure Laterality Date   BACK SURGERY  1990s   COLONOSCOPY N/A 02/25/2018   Procedure: COLONOSCOPY;  Surgeon: Sherrilyn Rist, MD;  Location: [facility];  Service: Gastroenterology;  Laterality: N/A;   HERNIA REPAIR     NECK SURGERY     POLYPECTOMY  02/25/2018   Procedure: POLYPECTOMY;  Surgeon: Sherrilyn Rist, MD;  Location: [facility];  Service: Gastroenterology;;   TONSILLECTOMY      Family History  Problem Relation Age of Onset   Diabetes Mother    Hypertension Mother    Hyperlipidemia Mother    Arthritis Mother     Osteoporosis Mother    Diabetes Father    Hypertension Father    Hyperlipidemia Father    Heart attack Father 42       Late 37s and 24s   Cancer Paternal Grandmother        colon   Healthy Sister    Healthy Brother    Healthy Daughter    Healthy Son    Healthy Son    Healthy Son       Controlled substance contract: n/a     Review of Systems  Constitutional:  Negative for diaphoresis.  Eyes:  Negative for pain.  Respiratory:  Negative for shortness of breath.   Cardiovascular:  Negative for chest pain, palpitations and leg swelling.  Gastrointestinal:  Negative for abdominal pain.  Endocrine: Negative for polydipsia.  Skin:  Negative for rash.  Neurological:  Negative for dizziness, weakness and headaches.  Hematological:  Does not bruise/bleed easily.  All other systems reviewed and are negative.      Objective:   Physical Exam Vitals and nursing note reviewed.  Constitutional:      Appearance: Normal appearance. He is well-developed.  HENT:     Head: Normocephalic.     Nose: Nose normal.     Mouth/Throat:     Mouth: Mucous membranes are moist.     Pharynx: Oropharynx is clear.  Eyes:     Pupils: Pupils are equal, round, and reactive to light.  Neck:     Thyroid: No thyroid mass or thyromegaly.     Vascular: No carotid bruit or JVD.     Trachea: Phonation normal.  Cardiovascular:     Rate and Rhythm: Normal rate. Rhythm irregular.  Pulmonary:     Effort: Pulmonary effort is normal. No respiratory distress.     Breath sounds: Normal breath sounds.  Abdominal:     General: Bowel sounds are normal.     Palpations: Abdomen is soft.     Tenderness: There is no abdominal tenderness.  Musculoskeletal:        General: Normal range of motion.     Cervical back: Normal range of motion and neck supple.  Lymphadenopathy:     Cervical: No cervical adenopathy.  Skin:    General: Skin is warm and dry.  Neurological:     Mental Status: He is alert and oriented  to person, place, and time.  Psychiatric:        Behavior: Behavior normal.        Thought Content: Thought content normal.        Judgment: Judgment normal.     BP 137/89   Pulse 99   Temp (!) 96.9 F (36.1 C) (Temporal)   Resp 20   Ht 6' (1.829 m)   Wt 297 lb (134.7 kg)   SpO2 93%   BMI 40.28 kg/m  Hgba1c 7.2%   EKG - atrial fib   CHF- 0 HTN-1 Age>70-0 Diabetes-1 Hx stroke (2)-0 Vascular disease-1 Age >65-1 Sex- male-0 Total : 4     Assessment & Plan:   AMELIO BROSKY comes in today with chief complaint of Medical Management of Chronic Issues   Diagnosis and orders addressed:  1. Primary hypertension Low sodium diet - CBC with Differential/Platelet - CMP14+EGFR - EKG 12-Lead - lisinopril-hydrochlorothiazide (ZESTORETIC) 20-25 MG tablet; Take 1 tablet by mouth daily.  Dispense: 90 tablet; Refill: 1  2. Hyperlipidemia associated with type 2 diabetes mellitus (HCC) Low fta diet - Lipid panel - atorvastatin (LIPITOR) 40 MG tablet; Take 1 tablet (40 mg total) by mouth daily.  Dispense: 90 tablet; Refill: 1 - fenofibrate 160 MG tablet; Take 1 tablet (160 mg total) by mouth daily.  Dispense: 90 tablet; Refill: 1  3. Type 2 diabetes mellitus with diabetic polyneuropathy, without long-term current use of insulin (HCC) Continue to watch carbs in diet - Bayer DCA Hb A1c Waived - Microalbumin / creatinine urine ratio - glipiZIDE (GLUCOTROL) 5 MG tablet; Take 1 tablet (5 mg total) by mouth 2 (two) times daily before a meal.  Dispense: 180 tablet; Refill: 1 - metFORMIN (GLUCOPHAGE) 1000 MG tablet; TAKE ONE TABLET TWICE DAILY WITH A MEAL.  Dispense: 180 tablet; Refill: 1  4. Neuropathy due to type 2 diabetes mellitus (HCC) Check feet daily Do not go barefooted  5. COPD with asthma (HCC) Continue current inhalers Keep follow up appt with pulmonology  6. Severe obesity (BMI >= 40) (HCC) Discussed diet and exercise for person with BMI >25 Will recheck weight  in 3-6 months   7. Bilateral carpal tunnel syndrome - Ambulatory referral to Orthopedic Surgery  8. Paroxysmal atrial fibrillation (HCC) Avoid caffeine Start on daily aspirin Start  on metoprolol for rate control - metoprolol succinate (TOPROL-XL) 25 MG 24 hr tablet; Take 1 tablet (25 mg total) by mouth daily.  Dispense: 90 tablet; Refill: 3 - Ambulatory referral to Cardiology   Labs pending Health Maintenance reviewed Diet and exercise encouraged  Follow up plan: 3 months   Mary-Margaret Daphine Deutscher, FNP

## 2023-04-23 NOTE — Patient Instructions (Signed)
Atrial Fibrillation Atrial fibrillation (AFib) is a type of heartbeat that is irregular or fast. If you have AFib, your heart beats without any order. This makes it hard for your heart to pump blood in a normal way. AFib may come and go, or it may become a long-lasting problem. If AFib is not treated, it can put you at higher risk for stroke, heart failure, and other heart problems. What are the causes? AFib may be caused by diseases that damage the heart's electrical system. They include: High blood pressure. Heart failure. Heart valve diseases. Heart surgery. Diabetes. Thyroid disease. Kidney disease. Lung diseases, such as pneumonia or COPD. Sleep apnea. Sometimes the cause is not known. What increases the risk? You are more likely to develop AFib if: You are older. You exercise often and very hard. You have a family history of AFib. You are male. You are Caucasian. You are overweight. You smoke. You drink a lot of alcohol. What are the signs or symptoms? Common symptoms of this condition include: A feeling that your heart is beating very fast. Chest pain or discomfort. Feeling short of breath. Suddenly feeling light-headed or weak. Getting tired easily during activity. Fainting. Sweating. In some cases, there are no symptoms. How is this treated? Medicines to: Prevent blood clots. Treat heart rate or heart rhythm problems. Using devices, such as a pacemaker, to correct heart rhythm problems. Doing surgery to remove the part of the heart that sends bad signals. Closing an area where clots can form in the heart (left atrial appendage). In some cases, your doctor will treat other underlying conditions. Follow these instructions at home: Medicines Take over-the-counter and prescription medicines only as told by your doctor. Do not take any new medicines without first talking to your doctor. If you are taking blood thinners: Talk with your doctor before taking aspirin  or NSAIDs, such as ibuprofen. Take your medicines as told. Take them at the same time each day. Do not do things that could hurt or bruise you. Be careful to avoid falls. Wear an alert bracelet or carry a card that says you take blood thinners. Lifestyle Do not smoke or use any products that contain nicotine or tobacco. If you need help quitting, ask your doctor. Eat heart-healthy foods. Talk with your doctor about the right eating plan for you. Exercise regularly as told by your doctor. Do not drink alcohol. Lose weight if you are overweight. General instructions If you have sleep apnea, treat it as told by your doctor. Do not use diet pills unless your doctor says they are safe for you. Diet pills may make heart problems worse. Keep all follow-up visits. Your doctor will check your heart rate and rhythm regularly. Contact a doctor if: You notice a change in the speed, rhythm, or strength of your heartbeat. You are taking a blood-thinning medicine and you get more bruising. You get tired more easily when you move or exercise. You have a sudden change in weight. Get help right away if:  You have pain in your chest. You have trouble breathing. You have side effects of blood thinners, such as blood in your vomit, poop (stool), or pee (urine), or bleeding that cannot stop. You have any signs of a stroke. "BE FAST" is an easy way to remember the main warning signs: B - Balance. Dizziness, sudden trouble walking, or loss of balance. E - Eyes. Trouble seeing or a change in how you see. F - Face. Sudden weakness or loss of  feeling in the face. The face or eyelid may droop on one side. A - Arms.Weakness or loss of feeling in an arm. This happens suddenly and usually on one side of the body. S - Speech. Sudden trouble speaking, slurred speech, or trouble understanding what people say. T - Time.Time to call emergency services. Write down what time symptoms started. You have other signs of a  stroke, such as: A sudden, very bad headache with no known cause. Feeling like you may vomit (nausea). Vomiting. A seizure. These symptoms may be an emergency. Get help right away. Call 911. Do not wait to see if the symptoms will go away. Do not drive yourself to the hospital. This information is not intended to replace advice given to you by your health care provider. Make sure you discuss any questions you have with your health care provider. Document Revised: 03/28/2022 Document Reviewed: 03/28/2022 Elsevier Patient Education  2024 [employer].

## 2023-04-24 LAB — MICROALBUMIN / CREATININE URINE RATIO
Creatinine, Urine: 53.2 mg/dL
Microalb/Creat Ratio: <6 mg/g{creat} (ref 0–29)
Microalbumin, Urine: <3 ug/mL

## 2023-04-26 ENCOUNTER — Encounter (HOSPITAL_COMMUNITY): Payer: Self-pay

## 2023-04-26 ENCOUNTER — Emergency Department (HOSPITAL_COMMUNITY): Payer: Medicare Other

## 2023-04-26 ENCOUNTER — Telehealth: Payer: Self-pay | Admitting: Nurse Practitioner

## 2023-04-26 ENCOUNTER — Other Ambulatory Visit: Payer: Self-pay

## 2023-04-26 ENCOUNTER — Emergency Department (HOSPITAL_COMMUNITY)
Admission: EM | Admit: 2023-04-26 | Discharge: 2023-04-26 | Disposition: A | Discharge status: Home / Self Care | Payer: Medicare Other | Attending: Emergency Medicine | Admitting: Emergency Medicine

## 2023-04-26 DIAGNOSIS — I4891 Unspecified atrial fibrillation: Secondary | ICD-10-CM | POA: Diagnosis not present

## 2023-04-26 DIAGNOSIS — E119 Type 2 diabetes mellitus without complications: Secondary | ICD-10-CM | POA: Insufficient documentation

## 2023-04-26 DIAGNOSIS — Z7984 Long term (current) use of oral hypoglycemic drugs: Secondary | ICD-10-CM | POA: Insufficient documentation

## 2023-04-26 DIAGNOSIS — J45909 Unspecified asthma, uncomplicated: Secondary | ICD-10-CM | POA: Insufficient documentation

## 2023-04-26 DIAGNOSIS — R519 Headache, unspecified: Secondary | ICD-10-CM | POA: Insufficient documentation

## 2023-04-26 DIAGNOSIS — Z7982 Long term (current) use of aspirin: Secondary | ICD-10-CM | POA: Diagnosis not present

## 2023-04-26 DIAGNOSIS — J449 Chronic obstructive pulmonary disease, unspecified: Secondary | ICD-10-CM | POA: Diagnosis not present

## 2023-04-26 DIAGNOSIS — Z7901 Long term (current) use of anticoagulants: Secondary | ICD-10-CM | POA: Insufficient documentation

## 2023-04-26 DIAGNOSIS — Z760 Encounter for issue of repeat prescription: Secondary | ICD-10-CM | POA: Insufficient documentation

## 2023-04-26 DIAGNOSIS — R0602 Shortness of breath: Secondary | ICD-10-CM | POA: Diagnosis present

## 2023-04-26 DIAGNOSIS — Z7951 Long term (current) use of inhaled steroids: Secondary | ICD-10-CM | POA: Diagnosis not present

## 2023-04-26 DIAGNOSIS — Z91010 Allergy to peanuts: Secondary | ICD-10-CM | POA: Insufficient documentation

## 2023-04-26 DIAGNOSIS — T50905A Adverse effect of unspecified drugs, medicaments and biological substances, initial encounter: Secondary | ICD-10-CM

## 2023-04-26 DIAGNOSIS — R079 Chest pain, unspecified: Secondary | ICD-10-CM | POA: Diagnosis not present

## 2023-04-26 LAB — BASIC METABOLIC PANEL
Anion gap: 9 (ref 5–15)
BUN: 17 mg/dL (ref 8–23)
CO2: 28 mmol/L (ref 22–32)
Calcium: 9.8 mg/dL (ref 8.9–10.3)
Chloride: 103 mmol/L (ref 98–111)
Creatinine, Ser: 1.07 mg/dL (ref 0.61–1.24)
GFR, Estimated: >60 mL/min (ref >60)
Glucose, Bld: 70 mg/dL (ref 70–99)
Potassium: 3.6 mmol/L (ref 3.5–5.1)
Sodium: 140 mmol/L (ref 135–145)

## 2023-04-26 LAB — CBC
HCT: 48 % (ref 39.0–52.0)
Hemoglobin: 16.2 g/dL (ref 13.0–17.0)
MCH: 29.2 pg (ref 26.0–34.0)
MCHC: 33.8 g/dL (ref 30.0–36.0)
MCV: 86.6 fL (ref 80.0–100.0)
Platelets: 286 10*3/uL (ref 150–400)
RBC: 5.54 MIL/uL (ref 4.22–5.81)
RDW: 12.4 % (ref 11.5–15.5)
WBC: 9.1 10*3/uL (ref 4.0–10.5)
nRBC: 0 % (ref 0.0–0.2)

## 2023-04-26 LAB — TROPONIN I (HIGH SENSITIVITY): Troponin I (High Sensitivity): 7 ng/L (ref <18)

## 2023-04-26 MED ORDER — DILTIAZEM HCL ER 120 MG PO CP12
120.0000 mg | ORAL_CAPSULE | Freq: Two times a day (BID) | ORAL | 1 refills | Status: DC
Start: 2023-04-26 — End: 2023-06-06

## 2023-04-26 MED ORDER — DILTIAZEM HCL 60 MG PO TABS
120.0000 mg | ORAL_TABLET | Freq: Once | ORAL | Status: AC
  Administered 2023-04-26: 120 mg via ORAL
  Filled 2023-04-26: qty 2

## 2023-04-26 MED ORDER — APIXABAN 5 MG PO TABS
5.0000 mg | ORAL_TABLET | Freq: Two times a day (BID) | ORAL | 0 refills | Status: DC
Start: 2023-04-26 — End: 2023-05-17

## 2023-04-26 MED ORDER — IPRATROPIUM-ALBUTEROL 0.5-2.5 (3) MG/3ML IN SOLN
3.0000 mL | Freq: Once | RESPIRATORY_TRACT | Status: AC
  Administered 2023-04-26: 3 mL via RESPIRATORY_TRACT
  Filled 2023-04-26: qty 3

## 2023-04-26 MED ORDER — MAGNESIUM SULFATE 2 GM/50ML IV SOLN
2.0000 g | Freq: Once | INTRAVENOUS | Status: AC
  Administered 2023-04-26: 2 g via INTRAVENOUS
  Filled 2023-04-26: qty 50

## 2023-04-26 MED ORDER — APIXABAN 5 MG PO TABS
5.0000 mg | ORAL_TABLET | Freq: Once | ORAL | Status: AC
  Administered 2023-04-26: 5 mg via ORAL
  Filled 2023-04-26: qty 1

## 2023-04-26 MED ORDER — METHYLPREDNISOLONE SODIUM SUCC 125 MG IJ SOLR
125.0000 mg | Freq: Once | INTRAMUSCULAR | Status: AC
  Administered 2023-04-26: 125 mg via INTRAVENOUS
  Filled 2023-04-26: qty 2

## 2023-04-26 NOTE — ED Triage Notes (Addendum)
Pt c/o left sided chest pain that radiates to left shoulder and left neck, hands numb bilat, tachycardia, SOBx3d.

## 2023-04-26 NOTE — Telephone Encounter (Signed)
Patient called stating that patient feels like his heart is beating out of his chest, he has a headache and dizzy. Patient advised that he needs to be evaluated by the ER.

## 2023-04-26 NOTE — Discharge Instructions (Addendum)
Information on my medicine - ELIQUIS (apixaban)  This medication education was reviewed with me or my healthcare representative as part of my discharge preparation.    Why was Eliquis prescribed for you? Eliquis was prescribed for you to reduce the risk of a blood clot forming that can cause a stroke if you have a medical condition called atrial fibrillation (a type of irregular heartbeat).  What do You need to know about Eliquis ? Take your Eliquis TWICE DAILY - one tablet in the morning and one tablet in the evening with or without food. If you have difficulty swallowing the tablet whole please discuss with your pharmacist how to take the medication safely.  Take Eliquis exactly as prescribed by your doctor and DO NOT stop taking Eliquis without talking to the doctor who prescribed the medication.  Stopping may increase your risk of developing a stroke.  Refill your prescription before you run out.  After discharge, you should have regular check-up appointments with your healthcare provider that is prescribing your Eliquis.  In the future your dose may need to be changed if your kidney function or weight changes by a significant amount or as you get older.  What do you do if you miss a dose? If you miss a dose, take it as soon as you remember on the same day and resume taking twice daily.  Do not take more than one dose of ELIQUIS at the same time to make up a missed dose.  Important Safety Information A possible side effect of Eliquis is bleeding. You should call your healthcare provider right away if you experience any of the following: Bleeding from an injury or your nose that does not stop. Unusual colored urine (red or dark brown) or unusual colored stools (red or black). Unusual bruising for unknown reasons. A serious fall or if you hit your head (even if there is no bleeding).  Some medicines may interact with Eliquis and might increase your risk of bleeding or clotting  while on Eliquis. To help avoid this, consult your healthcare provider or pharmacist prior to using any new prescription or non-prescription medications, including herbals, vitamins, non-steroidal anti-inflammatory drugs (NSAIDs) and supplements.  This website has more information on Eliquis (apixaban): http://www.eliquis.com/eliquis/home    Please follow-up with the A-fib clinic in regards to recent ER visit.  Today replaced on diltiazem and Eliquis for your A-fib.  Please discontinue use of your metoprolol.  Please follow-up with your primary care provider as well to update them of the situation.  If symptoms change or worsen please return to ER.

## 2023-04-26 NOTE — Telephone Encounter (Signed)
Dizziness is probably coming from atrial fib. Needs to stay on blood pessure meds

## 2023-04-26 NOTE — ED Provider Notes (Cosign Needed Addendum)
[place] EMERGENCY DEPARTMENT AT [facility] Provider Note   CSN: [id number] Arrival date & time: 04/26/23  1602     History  Chief Complaint  Patient presents with   Chest Pain    Kurt Blevins is a 62 y.o. male history of type 2 diabetes, COPD with asthma, hyperlipidemia, A-fib presented for chest pain with shortness of breath the past 3 days.  Patient dates he feels short of breath and is wheezing but does not have an inhaler at home.  Patient denies any productive sputum or cough.  Patient denies leg swelling, personal history of cancer, previous blood clots, recent travel/hospitalization/surgery.  Patient was at his primary care provider's office a few days ago and was noted to be in A-fib and was placed on metoprolol and since then has been feeling short of breath.  Patient also notes headache that goes from his head down to bilateral trapezius muscles in which he feels a band around his head and that his muscles are tightening.  Patient is taken aspirin for the headache to no relief.    Home Medications Prior to Admission medications   Medication Sig Start Date End Date Taking? Authorizing Provider  apixaban (ELIQUIS) 5 MG TABS tablet Take 1 tablet (5 mg total) by mouth 2 (two) times daily. 04/26/23 05/26/23 Yes Orlene Salmons, Beverly Gust, PA-C  diltiazem (CARDIZEM SR) 120 MG 12 hr capsule Take 1 capsule (120 mg total) by mouth 2 (two) times daily. 04/26/23 06/25/23 Yes SchumanBeverly Gust, PA-C  aspirin 81 MG tablet Take 81 mg by mouth daily.    [provider]  atorvastatin (LIPITOR) 40 MG tablet Take 1 tablet (40 mg total) by mouth daily. 04/23/23   Daphine Deutscher, Mary-Margaret, FNP  fenofibrate 160 MG tablet Take 1 tablet (160 mg total) by mouth daily. 04/23/23   Daphine Deutscher, Mary-Margaret, FNP  Fluticasone-Umeclidin-Vilant (TRELEGY ELLIPTA) 100-62.5-25 MCG/ACT AEPB Inhale 1 puff into the lungs daily. 01/07/23   Daphine Deutscher, Mary-Margaret, FNP  glipiZIDE (GLUCOTROL) 5 MG tablet Take 1 tablet  (5 mg total) by mouth 2 (two) times daily before a meal. 04/23/23   Daphine Deutscher, Mary-Margaret, FNP  glucose blood [facility] VERIO) test strip Check BS one daily Dx E11.42 07/09/22   Daphine Deutscher, Mary-Margaret, FNP  ipratropium-albuterol (DUONEB) 0.5-2.5 (3) MG/3ML SOLN USE 1 VIAL VIA NEBULIZER 3 TIMES DAILY AS NEEDED FOR SHORTNESS OF BREATH 03/07/23   Daphine Deutscher, Mary-Margaret, FNP  lisinopril-hydrochlorothiazide (ZESTORETIC) 20-25 MG tablet Take 1 tablet by mouth daily. 04/23/23   Daphine Deutscher, Mary-Margaret, FNP  metFORMIN (GLUCOPHAGE) 1000 MG tablet TAKE ONE TABLET TWICE DAILY WITH A MEAL. 04/23/23   Daphine Deutscher, Mary-Margaret, FNP  metoprolol succinate (TOPROL-XL) 25 MG 24 hr tablet Take 1 tablet (25 mg total) by mouth daily. 04/23/23   Bennie Pierini, FNP  OneTouch Delica Lancets 33G MISC Check blood sugar 1x per day and prn  E 11.9 07/09/22   Daphine Deutscher, Mary-Margaret, FNP      Allergies    Penicillins, Egg-derived products, and Peanut-containing drug products    Review of Systems   Review of Systems  Cardiovascular:  Positive for chest pain.    Physical Exam Updated Vital Signs BP 130/72   Pulse 62   Temp 97.6 F (36.4 C) (Oral)   Resp 14   Ht 6' (1.829 m)   Wt 134.7 kg   SpO2 99%   BMI 40.27 kg/m  Physical Exam  ED Results / Procedures / Treatments   Labs (all labs ordered are listed, but only abnormal results are  displayed) Labs Reviewed  BASIC METABOLIC PANEL  CBC  TROPONIN I (HIGH SENSITIVITY)    EKG EKG Interpretation Date/Time:  Friday April 26 2023 16:12:35 EDT Ventricular Rate:  89 PR Interval:    QRS Duration:  102 QT Interval:  384 QTC Calculation: 467 R Axis:   -21  Text Interpretation: Atrial fibrillation Incomplete right bundle branch block Abnormal ECG No previous ECGs available Confirmed by Pricilla Loveless [id number]) on 04/26/2023 4:20:19 PM  Radiology DG Chest 2 View  Result Date: 04/26/2023 CLINICAL DATA:  Chest pain. EXAM: CHEST - 2 VIEW COMPARISON:  10/08/2022  FINDINGS: The cardiomediastinal contours are normal. The lungs are clear. Pulmonary vasculature is normal. No consolidation, pleural effusion, or pneumothorax. No acute osseous abnormalities are seen. IMPRESSION: No active cardiopulmonary disease. Electronically Signed   By: Narda Rutherford M.D.   On: 04/26/2023 19:28   CT Head Wo Contrast  Result Date: 04/26/2023 CLINICAL DATA:  Headache for 3 days EXAM: CT HEAD WITHOUT CONTRAST TECHNIQUE: Contiguous axial images were obtained from the base of the skull through the vertex without intravenous contrast. RADIATION DOSE REDUCTION: This exam was performed according to the departmental dose-optimization program which includes automated exposure control, adjustment of the mA and/or kV according to patient size and/or use of iterative reconstruction technique. COMPARISON:  None Available. FINDINGS: Brain: No mass lesion, hemorrhage, hydrocephalus, acute infarct, intra-axial, or extra-axial fluid collection. Vascular: No hyperdense vessel or unexpected calcification. Skull: Normal Sinuses/Orbits: Normal imaged portions of the orbits and globes. Fluid in the right and likely the inferior left mastoid air cells. Paranasal sinuses are clear. Other: None. IMPRESSION: 1.  No acute intracranial abnormality. 2. Right and probable left sided mastoid effusion. Electronically Signed   By: Jeronimo Greaves M.D.   On: 04/26/2023 18:47    Procedures .Critical Care  Performed by: Netta Corrigan, PA-C Authorized by: Netta Corrigan, PA-C   Critical care provider statement:    Critical care time (minutes):  40   Critical care time was exclusive of:  Separately billable procedures and treating other patients   Critical care was necessary to treat or prevent imminent or life-threatening deterioration of the following conditions: New onset A-fib.   Critical care was time spent personally by me on the following activities:  Blood draw for specimens, development of treatment plan  with patient or surrogate, discussions with consultants, evaluation of patient's response to treatment, examination of patient, obtaining history from patient or surrogate, review of old charts, re-evaluation of patient's condition, pulse oximetry, ordering and review of radiographic studies, ordering and review of laboratory studies and ordering and performing treatments and interventions   I assumed direction of critical care for this patient from another provider in my specialty: no     Care discussed with comment:  Cardiologist on-call     Medications Ordered in ED Medications  diltiazem (CARDIZEM) tablet 120 mg (has no administration in time range)  apixaban (ELIQUIS) tablet 5 mg (has no administration in time range)  ipratropium-albuterol (DUONEB) 0.5-2.5 (3) MG/3ML nebulizer solution 3 mL (3 mLs Nebulization Given 04/26/23 1721)  magnesium sulfate IVPB 2 g 50 mL (0 g Intravenous Stopped 04/26/23 1826)  methylPREDNISolone sodium succinate (SOLU-MEDROL) 125 mg/2 mL injection 125 mg (125 mg Intravenous Given 04/26/23 1720)    ED Course/ Medical Decision Making/ A&P  Medical Decision Making Amount and/or Complexity of Data Reviewed Labs: ordered. Radiology: ordered.  Risk Prescription drug management.   Kurt Blevins 62 y.o. presented today for shortness of breath.  Working DDx that I considered at this time includes, but not limited to, medication induced, asthma/COPD exacerbation, URI, viral illness, anemia, ACS, PE, pneumonia, pleural effusion, lung cancer.  R/o DDx: asthma/COPD exacerbation, URI, viral illness, anemia, ACS, PE, pneumonia, pleural effusion, lung cancer: These are considered less likely due to history of present illness, physical exam, labs/imaging findings  Review of prior external notes: 04/23/2023 office visit  Unique Tests and My Interpretation:  CBC: Unremarkable BMP: Unremarkable EKG: 89 bpm, A-fib, incomplete right bundle  branch block, no ST elevations or depressions noted Troponin: 7 CXR: Unremarkable  Discussion with Independent Historian:  Wife  Discussion of Management of Tests:  Skains, MD Cardiology  Risk: Medium: prescription drug management  Risk Stratification Score: CHA2DS2-VASc: 2  Staffed with Criss Alvine, MD  Plan: On exam patient was no acute distress stable vitals.  Patient did have bilateral wheezing noted but otherwise had a reassuring physical exam.  Patient's headache sounds like a tension headache in nature however will obtain CT scan as patient states this is different than previous.  Patient reassuring neuroexam.  In terms of patient's shortness of breath and chest pain patient states that his chest feels tight and he is wheezing.  Patient states this happened after being placed on metoprolol and given his history of COPD have a high suspicion patient's metoprolol exacerbated the asthma.  Will treat with steroids, breathing treatment, magnesium and reassess.  On recheck patient stated they felt much better after the breathing treatment.  At this time patient's labs are reassuring as he has had chest pain for 3 days and the first troponin was negative and given that patient's chest pain is been constant will only need 1 troponin at this time as it already would have been elevated.  High suspicion patient's shortness of breath was caused by his metoprolol.  Will reach out to cardiology to get recommendations on if they want anticoagulation or diltiazem for his A-fib and to see if his appointment in December can be moved up.  I spoke to the cardiologist on-call and he recommends diltiazem 120 mg, eliques 5 mg bid, refer to afib clinic.  I spoke to patient extensively about the risks with Eliquis including easy bleeds and to avoid conditions in which she may find himself falling or any traumatic situation patient verbalized understanding acceptance of this.  Patient given 1 dose of Eliquis and  Cardizem before discharge and referred to the A-fib clinic.  Patient was given return precautions. Patient stable for discharge at this time.  Patient verbalized understanding of plan.  This chart was dictated using voice recognition software.  Despite best efforts to proofread,  errors can occur which can change the documentation meaning.         Final Clinical Impression(s) / ED Diagnoses Final diagnoses:  Medication refill  Atrial fibrillation, unspecified type (HCC)    Rx / DC Orders ED Discharge Orders          Ordered    apixaban (ELIQUIS) 5 MG TABS tablet  2 times daily        04/26/23 1949    diltiazem (CARDIZEM SR) 120 MG 12 hr capsule  2 times daily        04/26/23 1949    Amb Referral to AFIB Clinic  04/26/23 1952              Netta Corrigan, PA-C 04/26/23 1954    Netta Corrigan, PA-C 04/26/23 2040    Pricilla Loveless, MD 04/28/23 [phone]

## 2023-05-06 ENCOUNTER — Other Ambulatory Visit (HOSPITAL_COMMUNITY): Payer: Self-pay | Admitting: Internal Medicine

## 2023-05-06 ENCOUNTER — Ambulatory Visit (HOSPITAL_COMMUNITY)
Admission: RE | Admit: 2023-05-06 | Discharge: 2023-05-06 | Disposition: A | Discharge status: Home / Self Care | Payer: Medicare Other | Source: Ambulatory Visit | Attending: Internal Medicine | Admitting: Internal Medicine

## 2023-05-06 VITALS — BP 152/92 | HR 80 | Ht 72.0 in | Wt 294.8 lb

## 2023-05-06 DIAGNOSIS — E119 Type 2 diabetes mellitus without complications: Secondary | ICD-10-CM | POA: Insufficient documentation

## 2023-05-06 DIAGNOSIS — I4819 Other persistent atrial fibrillation: Secondary | ICD-10-CM | POA: Diagnosis not present

## 2023-05-06 DIAGNOSIS — J449 Chronic obstructive pulmonary disease, unspecified: Secondary | ICD-10-CM | POA: Insufficient documentation

## 2023-05-06 DIAGNOSIS — D6869 Other thrombophilia: Secondary | ICD-10-CM | POA: Diagnosis not present

## 2023-05-06 DIAGNOSIS — I4891 Unspecified atrial fibrillation: Secondary | ICD-10-CM

## 2023-05-06 DIAGNOSIS — R9431 Abnormal electrocardiogram [ECG] [EKG]: Secondary | ICD-10-CM | POA: Insufficient documentation

## 2023-05-06 DIAGNOSIS — I10 Essential (primary) hypertension: Secondary | ICD-10-CM | POA: Diagnosis not present

## 2023-05-06 DIAGNOSIS — E785 Hyperlipidemia, unspecified: Secondary | ICD-10-CM | POA: Insufficient documentation

## 2023-05-06 DIAGNOSIS — Z7901 Long term (current) use of anticoagulants: Secondary | ICD-10-CM | POA: Insufficient documentation

## 2023-05-06 NOTE — Progress Notes (Signed)
Primary Care Physician: Bennie Pierini, FNP Primary Cardiologist: None Electrophysiologist: None     Referring Physician: ED     Kurt Blevins is a 62 y.o. male with a history of T2DM, HTN, COPD, HLD, and persistent atrial fibrillation who presents for consultation in the [facility].  Patient went to ED for SOB on 04/26/23; he noted he went to PCP office several days prior and was found to be in Afib then started on metoprolol. Thought breathing symptoms related to start of metoprolol in light of COPD history; transitioned to diltiazem 120 mg BID. Patient is on Eliquis for a CHADS2VASC score of 2.  On evaluation today, he is currently in Afib. He feels palpitations, SOB, and tired when in Afib. He began full anticoagulation on 10/5. He is not taking Toprol and only taking diltiazem for rate control. He has not missed any doses of Eliquis. He notes his father had 2 heart attacks. He notes to intermittently have chest pain or tightness. He previously drank a lot of coffee daily but has now reduced intake to 2 cups daily. He does not drink alcohol.   Today, he denies symptoms of chest pain, orthopnea, PND, lower extremity edema, dizziness, presyncope, syncope, snoring, daytime somnolence, bleeding, or neurologic sequela. The patient is tolerating medications without difficulties and is otherwise without complaint today.    Atrial Fibrillation Risk Factors:  he does have symptoms or diagnosis of sleep apnea.   he has a BMI of Body mass index is 39.98 kg/m.Marland Kitchen Filed Weights   05/06/23 1028  Weight: 133.7 kg    Current Outpatient Medications  Medication Sig Dispense Refill   apixaban (ELIQUIS) 5 MG TABS tablet Take 1 tablet (5 mg total) by mouth 2 (two) times daily. 60 tablet 0   atorvastatin (LIPITOR) 40 MG tablet Take 1 tablet (40 mg total) by mouth daily. 90 tablet 1   diltiazem (CARDIZEM SR) 120 MG 12 hr capsule Take 1 capsule (120 mg total) by  mouth 2 (two) times daily. 60 capsule 1   fenofibrate 160 MG tablet Take 1 tablet (160 mg total) by mouth daily. 90 tablet 1   Fluticasone-Umeclidin-Vilant (TRELEGY ELLIPTA) 100-62.5-25 MCG/ACT AEPB Inhale 1 puff into the lungs daily. 1 each 11   glipiZIDE (GLUCOTROL) 5 MG tablet Take 1 tablet (5 mg total) by mouth 2 (two) times daily before a meal. 180 tablet 1   glucose blood (ONETOUCH VERIO) test strip Check BS one daily Dx E11.42 100 strip 3   ipratropium-albuterol (DUONEB) 0.5-2.5 (3) MG/3ML SOLN USE 1 VIAL VIA NEBULIZER 3 TIMES DAILY AS NEEDED FOR SHORTNESS OF BREATH 180 mL 1   lisinopril-hydrochlorothiazide (ZESTORETIC) 20-25 MG tablet Take 1 tablet by mouth daily. 90 tablet 1   metFORMIN (GLUCOPHAGE) 1000 MG tablet TAKE ONE TABLET TWICE DAILY WITH A MEAL. 180 tablet 1   OneTouch Delica Lancets 33G MISC Check blood sugar 1x per day and prn  E 11.9 100 each 5   aspirin 81 MG tablet Take 81 mg by mouth daily. (Patient not taking: Reported on 05/06/2023)     No current facility-administered medications for this encounter.    Atrial Fibrillation Management history:  Previous antiarrhythmic drugs: none Previous cardioversions: none Previous ablations: none Anticoagulation history: Eliquis   ROS- All systems are reviewed and negative except as per the HPI above.  Physical Exam: BP (!) 152/92   Pulse 80   Ht 6' (1.829 m)   Wt 133.7 kg   BMI  39.98 kg/m   GEN: Well nourished, well developed in no acute distress NECK: No JVD; No carotid bruits CARDIAC: Irregularly irregular rate and rhythm, no murmurs, rubs, gallops RESPIRATORY:  Clear to auscultation without rales, wheezing or rhonchi  ABDOMEN: Soft, non-tender, non-distended EXTREMITIES:  No edema; No deformity   EKG today demonstrates  Vent. rate 80 BPM PR interval * ms QRS duration 100 ms QT/QTcB 400/461 ms P-R-T axes * -3 56 Atrial fibrillation Low voltage QRS Incomplete right bundle branch block Abnormal ECG When  compared with ECG of 26-Apr-2023 16:12, PREVIOUS ECG IS PRESENT  Echo N/A  ASSESSMENT & PLAN CHA2DS2-VASc Score = 2  The patient's score is based upon: CHF History: 0 HTN History: 1 Diabetes History: 1 Stroke History: 0 Vascular Disease History: 0 Age Score: 0 Gender Score: 0       ASSESSMENT AND PLAN: Persistent Atrial Fibrillation (ICD10:  I48.19) The patient's CHA2DS2-VASc score is 2, indicating a 2.2% annual risk of stroke.    He is currently in Afib.  Education provided about Afib. Discussion about medication treatments and ablation going forward if indicated with ERAF. After discussion with patient, he would like to proceed with scheduling cardioversion. Labs obtained in ED on 10/4.   Informed Consent   Shared Decision Making/Informed Consent The risks (stroke, cardiac arrhythmias rarely resulting in the need for a temporary or permanent pacemaker, skin irritation or burns and complications associated with conscious sedation including aspiration, arrhythmia, respiratory failure and death), benefits (restoration of normal sinus rhythm) and alternatives of a direct current cardioversion were explained in detail to Kurt Blevins and he agrees to proceed.      Will schedule baseline echocardiogram once in normal rhythm.  Due to risk factors, intermittent chest pain, and father with two MI will have him establish with general cardiology. He prefers Dr. Clifton James due to wife sees him.   Secondary Hypercoagulable State (ICD10:  D68.69) The patient is at significant risk for stroke/thromboembolism based upon his CHA2DS2-VASc Score of 2.  Continue Apixaban (Eliquis).  No missed doses.   Follow up 2 weeks after DCCV.    Kurt Bells, PA-C  Afib Clinic [facility] [address] [place], [place] [address] [phone]

## 2023-05-06 NOTE — Patient Instructions (Addendum)
Cardioversion scheduled for: October 29th 2024    - Arrive at the [facility] and go to admitting at 6:30 am   - Do not eat or drink anything after midnight the night prior to your procedure.   - Take all your morning medication (except diabetic medications) with a sip of water prior to arrival.  - You will not be able to drive home after your procedure.    - Do NOT miss any doses of your blood thinner - if you should miss a dose please notify our office immediately.   - If you feel as if you go back into normal rhythm prior to scheduled cardioversion, please notify our office immediately.   If your procedure is canceled in the cardioversion suite you will be charged a cancellation fee.    Hold below medications 7 days prior to scheduled procedure/anesthesia.  Restart medication on the normal dosing day after scheduled procedure/anesthesia  Dulaglutide (Trulicity) Exenatide extended release (Bydureon bcise) Semaglutide (Ozempic) (WEGOVY)  Tirzepatide (Mounjaro)     Hold below medications 72 hours prior to scheduled procedure/anesthesia. Restart medication on the following day after scheduled procedure/anesthesia Bexagliflozin (Brenzavvy) Canagliflozin (Invokana) Canagliflozin/metformin (Invokamet/Invokamet XR) Dapagliflozin Marcelline Deist) Dapagliflozin/metformin (Xigduo XR) Dapagliflozin/saxagliptin Colbert Coyer) Empagliflozin (Jardiance) Empagliflozin/linagliptin (Glyxambi) Empagliflozin/linagliptin/metformin (Trijardy XR) Empagliflozin/metformin (Synjardy/Synjardy XR) Ertugliflozin (Steglatro) Ertugliflozin/metformin (Segluromet) Ertugliflozin/sitagliptin (Steglujan)    Hold below medications 24 hours prior to scheduled procedure/anesthesia.   Restart medication on the following day after scheduled procedure/anesthesia   Exenatide (Byetta)  Liraglutide (Victoza, Saxenda)  Lixisenatide (Adlyxin)  Semaglutide (Rybelsus) Polyethylene Glycol Loxenatide         For those patients who have a scheduled procedure/anesthesia on the same day of the week as their dose, hold the medication on the day of surgery.  They can take their scheduled dose the week before.  **Patients on the above medications scheduled for elective procedures that have not held the medication for the appropriate amount of time are at risk of cancellation or change in the anesthetic plan.

## 2023-05-06 NOTE — H&P (View-Only) (Signed)
Primary Care Physician: Kurt Pierini, FNP Primary Cardiologist: None Electrophysiologist: None     Referring Physician: ED     Kurt Blevins is a 62 y.o. male with a history of T2DM, HTN, COPD, HLD, and persistent atrial fibrillation who presents for consultation in the [facility].  Patient went to ED for SOB on 04/26/23; he noted he went to PCP office several days prior and was found to be in Afib then started on metoprolol. Thought breathing symptoms related to start of metoprolol in light of COPD history; transitioned to diltiazem 120 mg BID. Patient is on Eliquis for a CHADS2VASC score of 2.  On evaluation today, he is currently in Afib. He feels palpitations, SOB, and tired when in Afib. He began full anticoagulation on 10/5. He is not taking Toprol and only taking diltiazem for rate control. He has not missed any doses of Eliquis. He notes his father had 2 heart attacks. He notes to intermittently have chest pain or tightness. He previously drank a lot of coffee daily but has now reduced intake to 2 cups daily. He does not drink alcohol.   Today, he denies symptoms of chest pain, orthopnea, PND, lower extremity edema, dizziness, presyncope, syncope, snoring, daytime somnolence, bleeding, or neurologic sequela. The patient is tolerating medications without difficulties and is otherwise without complaint today.    Atrial Fibrillation Risk Factors:  he does have symptoms or diagnosis of sleep apnea.   he has a BMI of Body mass index is 39.98 kg/m.Marland Kitchen Filed Weights   05/06/23 1028  Weight: 133.7 kg    Current Outpatient Medications  Medication Sig Dispense Refill   apixaban (ELIQUIS) 5 MG TABS tablet Take 1 tablet (5 mg total) by mouth 2 (two) times daily. 60 tablet 0   atorvastatin (LIPITOR) 40 MG tablet Take 1 tablet (40 mg total) by mouth daily. 90 tablet 1   diltiazem (CARDIZEM SR) 120 MG 12 hr capsule Take 1 capsule (120 mg total) by  mouth 2 (two) times daily. 60 capsule 1   fenofibrate 160 MG tablet Take 1 tablet (160 mg total) by mouth daily. 90 tablet 1   Fluticasone-Umeclidin-Vilant (TRELEGY ELLIPTA) 100-62.5-25 MCG/ACT AEPB Inhale 1 puff into the lungs daily. 1 each 11   glipiZIDE (GLUCOTROL) 5 MG tablet Take 1 tablet (5 mg total) by mouth 2 (two) times daily before a meal. 180 tablet 1   glucose blood (ONETOUCH VERIO) test strip Check BS one daily Dx E11.42 100 strip 3   ipratropium-albuterol (DUONEB) 0.5-2.5 (3) MG/3ML SOLN USE 1 VIAL VIA NEBULIZER 3 TIMES DAILY AS NEEDED FOR SHORTNESS OF BREATH 180 mL 1   lisinopril-hydrochlorothiazide (ZESTORETIC) 20-25 MG tablet Take 1 tablet by mouth daily. 90 tablet 1   metFORMIN (GLUCOPHAGE) 1000 MG tablet TAKE ONE TABLET TWICE DAILY WITH A MEAL. 180 tablet 1   OneTouch Delica Lancets 33G MISC Check blood sugar 1x per day and prn  E 11.9 100 each 5   aspirin 81 MG tablet Take 81 mg by mouth daily. (Patient not taking: Reported on 05/06/2023)     No current facility-administered medications for this encounter.    Atrial Fibrillation Management history:  Previous antiarrhythmic drugs: none Previous cardioversions: none Previous ablations: none Anticoagulation history: Eliquis   ROS- All systems are reviewed and negative except as per the HPI above.  Physical Exam: BP (!) 152/92   Pulse 80   Ht 6' (1.829 m)   Wt 133.7 kg   BMI  39.98 kg/m   GEN: Well nourished, well developed in no acute distress NECK: No JVD; No carotid bruits CARDIAC: Irregularly irregular rate and rhythm, no murmurs, rubs, gallops RESPIRATORY:  Clear to auscultation without rales, wheezing or rhonchi  ABDOMEN: Soft, non-tender, non-distended EXTREMITIES:  No edema; No deformity   EKG today demonstrates  Vent. rate 80 BPM PR interval * ms QRS duration 100 ms QT/QTcB 400/461 ms P-R-T axes * -3 56 Atrial fibrillation Low voltage QRS Incomplete right bundle branch block Abnormal ECG When  compared with ECG of 26-Apr-2023 16:12, PREVIOUS ECG IS PRESENT  Echo N/A  ASSESSMENT & PLAN CHA2DS2-VASc Score = 2  The patient's score is based upon: CHF History: 0 HTN History: 1 Diabetes History: 1 Stroke History: 0 Vascular Disease History: 0 Age Score: 0 Gender Score: 0       ASSESSMENT AND PLAN: Persistent Atrial Fibrillation (ICD10:  I48.19) The patient's CHA2DS2-VASc score is 2, indicating a 2.2% annual risk of stroke.    He is currently in Afib.  Education provided about Afib. Discussion about medication treatments and ablation going forward if indicated with ERAF. After discussion with patient, he would like to proceed with scheduling cardioversion. Labs obtained in ED on 10/4.   Informed Consent   Shared Decision Making/Informed Consent The risks (stroke, cardiac arrhythmias rarely resulting in the need for a temporary or permanent pacemaker, skin irritation or burns and complications associated with conscious sedation including aspiration, arrhythmia, respiratory failure and death), benefits (restoration of normal sinus rhythm) and alternatives of a direct current cardioversion were explained in detail to Kurt Blevins and he agrees to proceed.      Will schedule baseline echocardiogram once in normal rhythm.  Due to risk factors, intermittent chest pain, and father with two MI will have him establish with general cardiology. He prefers Dr. Clifton Blevins due to wife sees him.   Secondary Hypercoagulable State (ICD10:  D68.69) The patient is at significant risk for stroke/thromboembolism based upon his CHA2DS2-VASc Score of 2.  Continue Apixaban (Eliquis).  No missed doses.   Follow up 2 weeks after DCCV.    Kurt Bells, PA-C  Afib Clinic [facility] [address] [place], [place] [address] [phone]

## 2023-05-17 ENCOUNTER — Other Ambulatory Visit: Payer: Self-pay | Admitting: Nurse Practitioner

## 2023-05-20 NOTE — OR Nursing (Signed)
Called patient with pre-procedure instructions for tomorrow.   Patient informed of:   Time to arrive for procedure. 0630 Remain NPO past midnight.  Must have a ride home and a responsible adult to remain with them for 24 ours post procedure.  Confirmed blood thinner. Confirmed no breaks in taking blood thinner for 3+ weeks prior to procedure. Confirmed patient stopped all GLP-1s and GLP-2s for at least one week before procedure.   Spoke with patient's wife regarding above information.

## 2023-05-21 ENCOUNTER — Encounter (HOSPITAL_COMMUNITY): Payer: Self-pay | Admitting: Cardiology

## 2023-05-21 ENCOUNTER — Ambulatory Visit (HOSPITAL_COMMUNITY): Payer: Medicare Other | Admitting: Anesthesiology

## 2023-05-21 ENCOUNTER — Ambulatory Visit (HOSPITAL_COMMUNITY)
Admission: RE | Admit: 2023-05-21 | Discharge: 2023-05-21 | Disposition: A | Discharge status: Home / Self Care | Payer: Medicare Other | Attending: Cardiology | Admitting: Cardiology

## 2023-05-21 ENCOUNTER — Other Ambulatory Visit: Payer: Self-pay

## 2023-05-21 ENCOUNTER — Encounter (HOSPITAL_COMMUNITY)
Admission: RE | Disposition: A | Discharge status: Home / Self Care | Payer: Self-pay | Source: Home / Self Care | Attending: Cardiology

## 2023-05-21 DIAGNOSIS — E785 Hyperlipidemia, unspecified: Secondary | ICD-10-CM | POA: Insufficient documentation

## 2023-05-21 DIAGNOSIS — E119 Type 2 diabetes mellitus without complications: Secondary | ICD-10-CM | POA: Diagnosis not present

## 2023-05-21 DIAGNOSIS — Z7984 Long term (current) use of oral hypoglycemic drugs: Secondary | ICD-10-CM | POA: Diagnosis not present

## 2023-05-21 DIAGNOSIS — J449 Chronic obstructive pulmonary disease, unspecified: Secondary | ICD-10-CM | POA: Diagnosis not present

## 2023-05-21 DIAGNOSIS — D6869 Other thrombophilia: Secondary | ICD-10-CM | POA: Insufficient documentation

## 2023-05-21 DIAGNOSIS — I4819 Other persistent atrial fibrillation: Secondary | ICD-10-CM | POA: Insufficient documentation

## 2023-05-21 DIAGNOSIS — I4891 Unspecified atrial fibrillation: Secondary | ICD-10-CM

## 2023-05-21 DIAGNOSIS — Z7901 Long term (current) use of anticoagulants: Secondary | ICD-10-CM | POA: Diagnosis not present

## 2023-05-21 DIAGNOSIS — I10 Essential (primary) hypertension: Secondary | ICD-10-CM | POA: Insufficient documentation

## 2023-05-21 DIAGNOSIS — Z79899 Other long term (current) drug therapy: Secondary | ICD-10-CM | POA: Insufficient documentation

## 2023-05-21 HISTORY — PX: CARDIOVERSION: SHX1299

## 2023-05-21 LAB — GLUCOSE, CAPILLARY: Glucose-Capillary: 203 mg/dL — ABNORMAL HIGH (ref 70–99)

## 2023-05-21 SURGERY — CARDIOVERSION
Anesthesia: General

## 2023-05-21 MED ORDER — SODIUM CHLORIDE 0.9 % IV SOLN: INTRAVENOUS | Status: DC

## 2023-05-21 MED ORDER — SODIUM CHLORIDE 0.9% FLUSH
INTRAVENOUS | Status: DC | PRN
  Administered 2023-05-21: 7 mL via INTRAVENOUS
  Administered 2023-05-21: 3 mL via INTRAVENOUS

## 2023-05-21 MED ORDER — LIDOCAINE 2% (20 MG/ML) 5 ML SYRINGE
INTRAMUSCULAR | Status: DC | PRN
  Administered 2023-05-21: 20 mg via INTRAVENOUS

## 2023-05-21 MED ORDER — PROPOFOL 10 MG/ML IV BOLUS
INTRAVENOUS | Status: DC | PRN
  Administered 2023-05-21: 20 mg via INTRAVENOUS
  Administered 2023-05-21: 80 mg via INTRAVENOUS

## 2023-05-21 SURGICAL SUPPLY — 1 items: PAD DEFIB RADIO PHYSIO CONN (PAD) ×2 IMPLANT

## 2023-05-21 NOTE — Interval H&P Note (Signed)
History and Physical Interval Note:  05/21/2023 7:42 AM  Kurt Blevins  has presented today for surgery, with the diagnosis of afib.  The various methods of treatment have been discussed with the patient and family. After consideration of risks, benefits and other options for treatment, the patient has consented to  Procedure(s): CARDIOVERSION (N/A) as a surgical intervention.  The patient's history has been reviewed, patient examined, no change in status, stable for surgery.  I have reviewed the patient's chart and labs.  Questions were answered to the patient's satisfaction.     [name]

## 2023-05-21 NOTE — Anesthesia Preprocedure Evaluation (Signed)
Anesthesia Evaluation  Patient identified by MRN, date of birth, ID band Patient awake    Reviewed: Allergy & Precautions, NPO status , Patient's Chart, lab work & pertinent test results  Airway Mallampati: III  TM Distance: >3 FB Neck ROM: Full    Dental   Pulmonary asthma , COPD, former smoker   breath sounds clear to auscultation       Cardiovascular hypertension, Pt. on medications + dysrhythmias  Rhythm:Regular Rate:Normal     Neuro/Psych  Neuromuscular disease    GI/Hepatic negative GI ROS, Neg liver ROS,,,  Endo/Other  diabetes    Renal/GU negative Renal ROS     Musculoskeletal   Abdominal  (+) + obese  Peds  Hematology negative hematology ROS (+)   Anesthesia Other Findings   Reproductive/Obstetrics                             Anesthesia Physical Anesthesia Plan  ASA: 3  Anesthesia Plan: General   Post-op Pain Management:    Induction: Intravenous  PONV Risk Score and Plan: 2 and Treatment may vary due to age or medical condition  Airway Management Planned: Natural Airway and Mask  Additional Equipment:   Intra-op Plan:   Post-operative Plan:   Informed Consent: I have reviewed the patients History and Physical, chart, labs and discussed the procedure including the risks, benefits and alternatives for the proposed anesthesia with the patient or authorized representative who has indicated his/her understanding and acceptance.       Plan Discussed with:   Anesthesia Plan Comments:        Anesthesia Quick Evaluation

## 2023-05-21 NOTE — Anesthesia Postprocedure Evaluation (Signed)
Anesthesia Post Note  Patient: FLINT ETTER  Procedure(s) Performed: CARDIOVERSION     Patient location during evaluation: PACU Anesthesia Type: General Level of consciousness: awake and alert Pain management: pain level controlled Vital Signs Assessment: post-procedure vital signs reviewed and stable Respiratory status: spontaneous breathing, nonlabored ventilation, respiratory function stable and patient connected to nasal cannula oxygen Cardiovascular status: blood pressure returned to baseline and stable Postop Assessment: no apparent nausea or vomiting Anesthetic complications: no  No notable events documented.  Last Vitals:  Vitals:   05/21/23 0815 05/21/23 0828  BP: (!) 127/98 (!) 123/90  Pulse: 67 74  Resp: 11 12  Temp:    SpO2: 96% 95%    Last Pain:  Vitals:   05/21/23 0801  TempSrc: Temporal  PainSc: 0-No pain                 Kennieth Rad

## 2023-05-21 NOTE — CV Procedure (Signed)
   Electrical Cardioversion Procedure Note DAKARAI JEPPSEN [id number] Nov 20, 1960  Procedure: Electrical Cardioversion Indications:  Atrial Fibrillation  Time Out: Verified patient identification, verified procedure,medications/allergies/relevent history reviewed, required imaging and test results available.  Performed  Procedure Details  The patient signed informed consent.   The patient was NPO past midnight. Has had therapeutic anticoagulation with Eliquis greater than 3 weeks. The patient denies any interruption of anticoagulation.  Anesthesia was administered by Dr. Sampson Goon.  Adequate airway was maintained throughout and vital followed per protocol.  He was cardioverted x 3 with 200 J of biphasic synchronized energy.  He did not convert to sinus rhythm.  There were no apparent complications.  The patient tolerated the procedure well and had normal neuro status and respiratory status post procedure with vitals stable as recorded elsewhere.     IMPRESSION:  Cardioversion unsuccessful, after 3 shocks of 200 J patient did not convert to sinus rhythm.  Follow up:  We will arrange follow up with primary cardiology.  He will continue on current medical therapy.  The patient advised to continue anticoagulation.  Ezri Landers 05/21/2023, 7:57 AM

## 2023-05-21 NOTE — Transfer of Care (Signed)
Immediate Anesthesia Transfer of Care Note  Patient: Kurt Blevins  Procedure(s) Performed: CARDIOVERSION  Patient Location: Cath Lab  Anesthesia Type:General  Level of Consciousness: awake, alert , oriented, patient cooperative, and responds to stimulation  Airway & Oxygen Therapy: Patient Spontanous Breathing and Patient connected to nasal cannula oxygen  Post-op Assessment: Report given to RN and Post -op Vital signs reviewed and stable  Post vital signs: Reviewed and stable  Last Vitals:  Vitals Value Taken Time  BP 121/81 05/21/23 0755  Temp 97.2F   Pulse 77 05/21/23 0755  Resp 18 05/21/23 0755  SpO2 96 % 05/21/23 0755        Complications: No notable events documented.

## 2023-05-22 ENCOUNTER — Telehealth: Payer: Self-pay

## 2023-05-22 NOTE — Telephone Encounter (Signed)
**Note De-Identified Brannon Decaire Obfuscation** Per the [facility] Provider Portal: Notification or Prior Authorization is not required for the requested services: Split Night Sleep Study (CPT Code: 16109). You are not required to submit a notification/prior authorization based on the information provided.  Decision ID #: [id number]

## 2023-06-04 ENCOUNTER — Ambulatory Visit (HOSPITAL_COMMUNITY): Payer: Medicare Other | Admitting: Internal Medicine

## 2023-06-06 ENCOUNTER — Ambulatory Visit (HOSPITAL_COMMUNITY)
Admission: RE | Admit: 2023-06-06 | Discharge: 2023-06-06 | Disposition: A | Discharge status: Home / Self Care | Payer: Medicare Other | Source: Ambulatory Visit | Attending: Internal Medicine | Admitting: Internal Medicine

## 2023-06-06 VITALS — BP 140/76 | HR 78 | Ht 72.0 in | Wt 291.6 lb

## 2023-06-06 DIAGNOSIS — J449 Chronic obstructive pulmonary disease, unspecified: Secondary | ICD-10-CM | POA: Diagnosis not present

## 2023-06-06 DIAGNOSIS — I110 Hypertensive heart disease with heart failure: Secondary | ICD-10-CM | POA: Diagnosis not present

## 2023-06-06 DIAGNOSIS — I4819 Other persistent atrial fibrillation: Secondary | ICD-10-CM | POA: Diagnosis not present

## 2023-06-06 DIAGNOSIS — I509 Heart failure, unspecified: Secondary | ICD-10-CM | POA: Insufficient documentation

## 2023-06-06 DIAGNOSIS — E785 Hyperlipidemia, unspecified: Secondary | ICD-10-CM | POA: Insufficient documentation

## 2023-06-06 DIAGNOSIS — E119 Type 2 diabetes mellitus without complications: Secondary | ICD-10-CM | POA: Diagnosis not present

## 2023-06-06 DIAGNOSIS — D6869 Other thrombophilia: Secondary | ICD-10-CM | POA: Insufficient documentation

## 2023-06-06 LAB — CBC
HCT: 44.1 % (ref 39.0–52.0)
Hemoglobin: 15 g/dL (ref 13.0–17.0)
MCH: 29.9 pg (ref 26.0–34.0)
MCHC: 34 g/dL (ref 30.0–36.0)
MCV: 87.8 fL (ref 80.0–100.0)
Platelets: 273 10*3/uL (ref 150–400)
RBC: 5.02 MIL/uL (ref 4.22–5.81)
RDW: 12.3 % (ref 11.5–15.5)
WBC: 7.6 10*3/uL (ref 4.0–10.5)
nRBC: 0 % (ref 0.0–0.2)

## 2023-06-06 LAB — BASIC METABOLIC PANEL
Anion gap: 9 (ref 5–15)
BUN: 19 mg/dL (ref 8–23)
CO2: 26 mmol/L (ref 22–32)
Calcium: 9.5 mg/dL (ref 8.9–10.3)
Chloride: 103 mmol/L (ref 98–111)
Creatinine, Ser: 1.09 mg/dL (ref 0.61–1.24)
GFR, Estimated: >60 mL/min (ref >60)
Glucose, Bld: 213 mg/dL — ABNORMAL HIGH (ref 70–99)
Potassium: 3.9 mmol/L (ref 3.5–5.1)
Sodium: 138 mmol/L (ref 135–145)

## 2023-06-06 MED ORDER — APIXABAN 5 MG PO TABS: 5.0000 mg | ORAL_TABLET | Freq: Two times a day (BID) | ORAL | 5 refills | Status: DC

## 2023-06-06 MED ORDER — DILTIAZEM HCL ER 120 MG PO CP12: 120.0000 mg | ORAL_CAPSULE | Freq: Two times a day (BID) | ORAL | 5 refills | Status: DC

## 2023-06-06 NOTE — Progress Notes (Addendum)
Primary Care Physician: Bennie Pierini, FNP Primary Cardiologist: None Electrophysiologist: None     Referring Physician: ED     Kurt Blevins is a 62 y.o. male with a history of T2DM, HTN, COPD, HLD, and persistent atrial fibrillation who presents for consultation in the [facility].  Patient went to ED for SOB on 04/26/23; he noted he went to PCP office several days prior and was found to be in Afib then started on metoprolol. Thought breathing symptoms related to start of metoprolol in light of COPD history; transitioned to diltiazem 120 mg BID. Patient is on Eliquis for a CHADS2VASC score of 2.  On evaluation today, he is currently in Afib. He feels palpitations, SOB, and tired when in Afib. He began full anticoagulation on 10/5. He is not taking Toprol and only taking diltiazem for rate control. He has not missed any doses of Eliquis. He notes his father had 2 heart attacks. He notes to intermittently have chest pain or tightness. He previously drank a lot of coffee daily but has now reduced intake to 2 cups daily. He does not drink alcohol.   On follow up 06/06/23, he is currently in Afib. S/p unsuccessful DCCV on 05/21/23. He has not missed any doses of Eliquis. He is interested in trying to maintain normal rhythm.   Today, he denies symptoms of chest pain, orthopnea, PND, lower extremity edema, dizziness, presyncope, syncope, snoring, daytime somnolence, bleeding, or neurologic sequela. The patient is tolerating medications without difficulties and is otherwise without complaint today.    Atrial Fibrillation Risk Factors:  he does have symptoms or diagnosis of sleep apnea.   he has a BMI of Body mass index is 39.55 kg/m.Marland Kitchen Filed Weights   06/06/23 1008  Weight: 132.3 kg     Current Outpatient Medications  Medication Sig Dispense Refill   apixaban (ELIQUIS) 5 MG TABS tablet TAKE ONE TABLET BY MOUTH TWO TIMES DAILY. 60 tablet 0    atorvastatin (LIPITOR) 40 MG tablet Take 1 tablet (40 mg total) by mouth daily. 90 tablet 1   diltiazem (CARDIZEM SR) 120 MG 12 hr capsule Take 1 capsule (120 mg total) by mouth 2 (two) times daily. 60 capsule 1   fenofibrate 160 MG tablet Take 1 tablet (160 mg total) by mouth daily. 90 tablet 1   Fluticasone-Umeclidin-Vilant (TRELEGY ELLIPTA) 100-62.5-25 MCG/ACT AEPB Inhale 1 puff into the lungs daily. 1 each 11   glipiZIDE (GLUCOTROL) 5 MG tablet Take 1 tablet (5 mg total) by mouth 2 (two) times daily before a meal. 180 tablet 1   glucose blood (ONETOUCH VERIO) test strip Check BS one daily Dx E11.42 100 strip 3   ipratropium-albuterol (DUONEB) 0.5-2.5 (3) MG/3ML SOLN USE 1 VIAL VIA NEBULIZER 3 TIMES DAILY AS NEEDED FOR SHORTNESS OF BREATH (Patient taking differently: Inhale 3 mLs into the lungs in the morning, at noon, and at bedtime.) 180 mL 1   lisinopril-hydrochlorothiazide (ZESTORETIC) 20-25 MG tablet Take 1 tablet by mouth daily. 90 tablet 1   metFORMIN (GLUCOPHAGE) 1000 MG tablet TAKE ONE TABLET TWICE DAILY WITH A MEAL. 180 tablet 1   OneTouch Delica Lancets 33G MISC Check blood sugar 1x per day and prn  E 11.9 100 each 5   No current facility-administered medications for this encounter.    Atrial Fibrillation Management history:  Previous antiarrhythmic drugs: none Previous cardioversions: 05/21/23 (unsuccessful)  Previous ablations: none Anticoagulation history: Eliquis   ROS- All systems are reviewed and negative except  as per the HPI above.  Physical Exam: BP (!) 140/76   Pulse 78   Ht 6' (1.829 m)   Wt 132.3 kg   BMI 39.55 kg/m   GEN- The patient is well appearing, alert and oriented x 3 today.   Neck - no JVD or carotid bruit noted Lungs- Clear to ausculation bilaterally, normal work of breathing Heart- Irregular rate and rhythm, no murmurs, rubs or gallops, PMI not laterally displaced Extremities- no clubbing, cyanosis, or edema Skin - no rash or ecchymosis  noted   EKG today demonstrates  Vent. rate 78 BPM PR interval * ms QRS duration 104 ms QT/QTcB 402/458 ms P-R-T axes * -41 53 Atrial fibrillation Left axis deviation Low voltage QRS Incomplete right bundle branch block Nonspecific T wave abnormality Abnormal ECG When compared with ECG of 06-May-2023 10:42, PREVIOUS ECG IS PRESENT  Echo N/A  ASSESSMENT & PLAN CHA2DS2-VASc Score = 2  The patient's score is based upon: CHF History: 0 HTN History: 1 Diabetes History: 1 Stroke History: 0 Vascular Disease History: 0 Age Score: 0 Gender Score: 0       ASSESSMENT AND PLAN: Persistent Atrial Fibrillation (ICD10:  I48.19) The patient's CHA2DS2-VASc score is 2, indicating a 2.2% annual risk of stroke.   S/p unsuccessful DCCV on 05/21/23 (3 x 200J).   He is currently in Afib.  Discussion regarding next steps considering he had an unsuccessful cardioversion. Due to his age, I recommended final attempt of cardioversion using a 360J defibrillator. After discussion, he would like to proceed with cardioversion using 360J defibrillator. Labs obtained today.  Informed Consent   Shared Decision Making/Informed Consent The risks (stroke, cardiac arrhythmias rarely resulting in the need for a temporary or permanent pacemaker, skin irritation or burns and complications associated with conscious sedation including aspiration, arrhythmia, respiratory failure and death), benefits (restoration of normal sinus rhythm) and alternatives of a direct current cardioversion were explained in detail to [name] and he agrees to proceed.      Will schedule baseline echocardiogram; he is rate controlled.   He is scheduled to see Dr. Clifton James.   Secondary Hypercoagulable State (ICD10:  D68.69) The patient is at significant risk for stroke/thromboembolism based upon his CHA2DS2-VASc Score of 2.  Continue Apixaban (Eliquis).  No missed doses.    Follow up 2 weeks after DCCV.   [name],  PA-C  Afib Clinic [facility] [address] [place], [place] [address] [phone]

## 2023-06-06 NOTE — Patient Instructions (Signed)
Cardioversion scheduled for: Monday, December 9th   - Arrive at the [facility] and go to admitting at [facility] not eat or drink anything after midnight the night prior to your procedure.   - Take all your morning medication (except diabetic medications) with a sip of water prior to arrival.  - You will not be able to drive home after your procedure.    - Do NOT miss any doses of your blood thinner - if you should miss a dose please notify our office immediately.   - If you feel as if you go back into normal rhythm prior to scheduled cardioversion, please notify our office immediately.   If your procedure is canceled in the cardioversion suite you will be charged a cancellation fee.

## 2023-06-10 ENCOUNTER — Telehealth: Payer: Self-pay | Admitting: Family Medicine

## 2023-06-10 DIAGNOSIS — I10 Essential (primary) hypertension: Secondary | ICD-10-CM

## 2023-06-10 NOTE — Telephone Encounter (Signed)
Copied from CRM [id number]. Topic: General - Other >> Jun 10, 2023  8:28 AM Roswell Nickel wrote: Reason for CRM: Patient's wife calling would likes  a referral put in for Cardiology Dr.archie Terance Ice  address [address] [place] [address] contact info [phone].

## 2023-06-10 NOTE — Addendum Note (Signed)
Addended by: Bennie Pierini on: 06/10/2023 12:24 PM   Modules accepted: Orders

## 2023-06-12 ENCOUNTER — Telehealth: Payer: Self-pay | Admitting: Nurse Practitioner

## 2023-06-12 NOTE — Telephone Encounter (Signed)
REFERRAL REQUEST Telephone Note  Have you been seen at our office for this problem? yes (Advise that they may need an appointment with their PCP before a referral can be done)  Reason for Referral: irregular heart beat Referral discussed with patient: yes  Best contact number of patient for referral team: [phone] (after 3pm)   Has patient been seen by a specialist for this issue before: yes, but wants second opinion  Patient provider preference for referral: Dr Briscoe Burns Terance Ice, MD Patient location preference for referral: [place], [place]   Patient notified that referrals can take up to a week or longer to process. If they haven't heard anything within a week they should call back and speak with the referral department.  [address] [place], [place] [address] [phone]  Pt states that they do take his Insurance.  Please call him ASAP bc other doctor at [facility] wanted to shock his heart at 360, but he said no.   MMM's pt.

## 2023-06-13 NOTE — Telephone Encounter (Signed)
Referral has been sent to requested Office.

## 2023-06-27 ENCOUNTER — Ambulatory Visit (HOSPITAL_COMMUNITY): Payer: Medicare Other

## 2023-06-28 ENCOUNTER — Ambulatory Visit: Payer: Medicare Other | Admitting: Cardiology

## 2023-07-01 ENCOUNTER — Ambulatory Visit (HOSPITAL_COMMUNITY): Admit: 2023-07-01 | Payer: Medicare Other | Admitting: Cardiology

## 2023-07-01 ENCOUNTER — Encounter (HOSPITAL_COMMUNITY): Payer: Self-pay

## 2023-07-01 SURGERY — CARDIOVERSION (CATH LAB)
Anesthesia: Monitor Anesthesia Care

## 2023-07-08 DIAGNOSIS — R072 Precordial pain: Secondary | ICD-10-CM | POA: Diagnosis not present

## 2023-07-08 DIAGNOSIS — I10 Essential (primary) hypertension: Secondary | ICD-10-CM | POA: Diagnosis not present

## 2023-07-08 DIAGNOSIS — E782 Mixed hyperlipidemia: Secondary | ICD-10-CM | POA: Diagnosis not present

## 2023-07-08 DIAGNOSIS — J449 Chronic obstructive pulmonary disease, unspecified: Secondary | ICD-10-CM | POA: Diagnosis not present

## 2023-07-08 DIAGNOSIS — I4819 Other persistent atrial fibrillation: Secondary | ICD-10-CM | POA: Diagnosis not present

## 2023-07-08 DIAGNOSIS — R0602 Shortness of breath: Secondary | ICD-10-CM | POA: Diagnosis not present

## 2023-07-08 DIAGNOSIS — R9431 Abnormal electrocardiogram [ECG] [EKG]: Secondary | ICD-10-CM | POA: Diagnosis not present

## 2023-07-09 ENCOUNTER — Ambulatory Visit (HOSPITAL_COMMUNITY): Payer: Medicare Other | Admitting: Internal Medicine

## 2023-07-11 ENCOUNTER — Ambulatory Visit (HOSPITAL_COMMUNITY): Payer: Medicare Other | Admitting: Internal Medicine

## 2023-07-11 DIAGNOSIS — I4819 Other persistent atrial fibrillation: Secondary | ICD-10-CM | POA: Diagnosis not present

## 2023-07-11 DIAGNOSIS — I10 Essential (primary) hypertension: Secondary | ICD-10-CM | POA: Diagnosis not present

## 2023-07-11 DIAGNOSIS — E782 Mixed hyperlipidemia: Secondary | ICD-10-CM | POA: Diagnosis not present

## 2023-07-26 ENCOUNTER — Encounter: Payer: Self-pay | Admitting: Nurse Practitioner

## 2023-07-26 ENCOUNTER — Ambulatory Visit (INDEPENDENT_AMBULATORY_CARE_PROVIDER_SITE_OTHER): Payer: Medicare Other | Admitting: Nurse Practitioner

## 2023-07-26 VITALS — BP 138/84 | HR 64 | Temp 97.2°F | Ht 72.0 in | Wt 290.0 lb

## 2023-07-26 DIAGNOSIS — E1142 Type 2 diabetes mellitus with diabetic polyneuropathy: Secondary | ICD-10-CM | POA: Diagnosis not present

## 2023-07-26 DIAGNOSIS — I10 Essential (primary) hypertension: Secondary | ICD-10-CM | POA: Diagnosis not present

## 2023-07-26 DIAGNOSIS — E1140 Type 2 diabetes mellitus with diabetic neuropathy, unspecified: Secondary | ICD-10-CM | POA: Diagnosis not present

## 2023-07-26 DIAGNOSIS — E1169 Type 2 diabetes mellitus with other specified complication: Secondary | ICD-10-CM

## 2023-07-26 DIAGNOSIS — Z7984 Long term (current) use of oral hypoglycemic drugs: Secondary | ICD-10-CM

## 2023-07-26 DIAGNOSIS — J4489 Other specified chronic obstructive pulmonary disease: Secondary | ICD-10-CM | POA: Diagnosis not present

## 2023-07-26 DIAGNOSIS — E785 Hyperlipidemia, unspecified: Secondary | ICD-10-CM

## 2023-07-26 DIAGNOSIS — Z6839 Body mass index (BMI) 39.0-39.9, adult: Secondary | ICD-10-CM

## 2023-07-26 DIAGNOSIS — I4819 Other persistent atrial fibrillation: Secondary | ICD-10-CM

## 2023-07-26 LAB — BAYER DCA HB A1C WAIVED: HB A1C (BAYER DCA - WAIVED): 7.7 % — ABNORMAL HIGH (ref 4.8–5.6)

## 2023-07-26 LAB — LIPID PANEL

## 2023-07-26 MED ORDER — IPRATROPIUM-ALBUTEROL 0.5-2.5 (3) MG/3ML IN SOLN: RESPIRATORY_TRACT | 1 refills | Status: DC

## 2023-07-26 NOTE — Progress Notes (Signed)
 Subjective:    Patient ID: Kurt Blevins, male    DOB: 25-Dec-1960, 63 y.o.   MRN: [id number]   Chief Complaint: medical management of chronic issues     HPI:  Kurt Blevins is a 63 y.o. who identifies as a male who was assigned male at birth.   Social history: Lives with: wife Work history: disability   Comes in today for follow up of the following chronic medical issues:  1. Primary hypertension No c/o chest pain, sob or headache. Does check blood pressure at home. Usually runs in the 120 systolic. BP Readings from Last 3 Encounters:  06/06/23 (!) 140/76  05/21/23 (!) 123/90  05/06/23 (!) 152/92     2. Persistent atrial fib Denies heart racing and or palpitations  3. Hyperlipidemia associated with type 2 diabetes mellitus (HCC) Does not watch diet very closely and does no dedicated exercise. Lab Results  Component Value Date   CHOL 125 04/23/2023   HDL 33 (L) 04/23/2023   LDLCALC 54 04/23/2023   TRIG 241 (H) 04/23/2023   CHOLHDL 3.8 04/23/2023     4. Type 2 diabetes mellitus with diabetic polyneuropathy, without long-term current use of insulin (HCC) Fasting blood sugars are running around 130-150. No low blood sugars Lab Results  Component Value Date   HGBA1C 7.2 (H) 04/23/2023     5. Neuropathy due to type 2 diabetes mellitus (HCC) Feet stay numb with occasional burning  6. COPD with asthma (HCC) No cough  7. Severe obesity (BMI >= 40) (HCC) Weight is up 5 lbs  Wt Readings from Last 3 Encounters:  07/26/23 290 lb (131.5 kg)  06/06/23 291 lb 9.6 oz (132.3 kg)  05/21/23 290 lb (131.5 kg)   BMI Readings from Last 3 Encounters:  07/26/23 39.33 kg/m  06/06/23 39.55 kg/m  05/21/23 39.33 kg/m       New complaints: None today  Allergies  Allergen Reactions   Penicillins Anaphylaxis and Other (See Comments)    Whelps Has patient had a PCN reaction causing immediate rash, facial/tongue/throat swelling, SOB or lightheadedness with  hypotension: Yes Has patient had a PCN reaction causing severe rash involving mucus membranes or skin necrosis: Yes Has patient had a PCN reaction that required hospitalization: Yes Has patient had a PCN reaction occurring within the last 10 years: No If all of the above answers are NO, then may proceed with Cephalosporin use.    Egg-Derived Products Nausea Only and Other (See Comments)    Stomach cramps   Metoprolol      Severe headache, caused shoulder, neck and arm pain   Peanut-Containing Drug Products Hives and Itching   Outpatient Encounter Medications as of 07/26/2023  Medication Sig   apixaban  (ELIQUIS ) 5 MG TABS tablet Take 1 tablet (5 mg total) by mouth 2 (two) times daily.   atorvastatin  (LIPITOR) 40 MG tablet Take 1 tablet (40 mg total) by mouth daily.   diltiazem  (CARDIZEM  SR) 120 MG 12 hr capsule Take 1 capsule (120 mg total) by mouth 2 (two) times daily.   fenofibrate  160 MG tablet Take 1 tablet (160 mg total) by mouth daily.   Fluticasone -Umeclidin-Vilant (TRELEGY ELLIPTA ) 100-62.5-25 MCG/ACT AEPB Inhale 1 puff into the lungs daily.   glipiZIDE  (GLUCOTROL ) 5 MG tablet Take 1 tablet (5 mg total) by mouth 2 (two) times daily before a meal.   glucose blood (ONETOUCH VERIO) test strip Check BS one daily Dx E11.42   ipratropium-albuterol  (DUONEB) 0.5-2.5 (3) MG/3ML SOLN USE 1 VIAL  VIA NEBULIZER 3 TIMES DAILY AS NEEDED FOR SHORTNESS OF BREATH (Patient taking differently: Inhale 3 mLs into the lungs in the morning, at noon, and at bedtime.)   lisinopril -hydrochlorothiazide  (ZESTORETIC ) 20-25 MG tablet Take 1 tablet by mouth daily.   metFORMIN  (GLUCOPHAGE ) 1000 MG tablet TAKE ONE TABLET TWICE DAILY WITH A MEAL.   OneTouch Delica Lancets 33G MISC Check blood sugar 1x per day and prn  E 11.9   No facility-administered encounter medications on file as of 07/26/2023.    Past Surgical History:  Procedure Laterality Date   BACK SURGERY  1990s   CARDIOVERSION N/A 05/21/2023    Procedure: CARDIOVERSION;  Surgeon: Sheena Pugh, DO;  Location: [facility];  Service: Cardiovascular;  Laterality: N/A;   COLONOSCOPY N/A 02/25/2018   Procedure: COLONOSCOPY;  Surgeon: Legrand Victory LITTIE DOUGLAS, MD;  Location: [facility];  Service: Gastroenterology;  Laterality: N/A;   HERNIA REPAIR     NECK SURGERY     POLYPECTOMY  02/25/2018   Procedure: POLYPECTOMY;  Surgeon: Legrand Victory LITTIE DOUGLAS, MD;  Location: [facility];  Service: Gastroenterology;;   TONSILLECTOMY      Family History  Problem Relation Age of Onset   Diabetes Mother    Hypertension Mother    Hyperlipidemia Mother    Arthritis Mother    Osteoporosis Mother    Diabetes Father    Hypertension Father    Hyperlipidemia Father    Heart attack Father 72       Late 45s and 3s   Cancer Paternal Grandmother        colon   Healthy Sister    Healthy Brother    Healthy Daughter    Healthy Son    Healthy Son    Healthy Son       Controlled substance contract: n/a     Review of Systems  Constitutional:  Negative for diaphoresis.  Eyes:  Negative for pain.  Respiratory:  Negative for shortness of breath.   Cardiovascular:  Negative for chest pain, palpitations and leg swelling.  Gastrointestinal:  Negative for abdominal pain.  Endocrine: Negative for polydipsia.  Skin:  Negative for rash.  Neurological:  Negative for dizziness, weakness and headaches.  Hematological:  Does not bruise/bleed easily.  All other systems reviewed and are negative.      Objective:   Physical Exam Vitals and nursing note reviewed.  Constitutional:      Appearance: Normal appearance. He is well-developed.  HENT:     Head: Normocephalic.     Nose: Nose normal.     Mouth/Throat:     Mouth: Mucous membranes are moist.     Pharynx: Oropharynx is clear.  Eyes:     Pupils: Pupils are equal, round, and reactive to light.  Neck:     Thyroid : No thyroid  mass or thyromegaly.     Vascular: No carotid bruit or JVD.      Trachea: Phonation normal.  Cardiovascular:     Rate and Rhythm: Normal rate. Rhythm irregular.  Pulmonary:     Effort: Pulmonary effort is normal. No respiratory distress.     Breath sounds: Normal breath sounds.  Abdominal:     General: Bowel sounds are normal.     Palpations: Abdomen is soft.     Tenderness: There is no abdominal tenderness.  Musculoskeletal:        General: Normal range of motion.     Cervical back: Normal range of motion and neck supple.  Lymphadenopathy:  Cervical: No cervical adenopathy.  Skin:    General: Skin is warm and dry.  Neurological:     Mental Status: He is alert and oriented to person, place, and time.  Psychiatric:        Behavior: Behavior normal.        Thought Content: Thought content normal.        Judgment: Judgment normal.    BP 138/84   Pulse 64   Temp (!) 97.2 F (36.2 C) (Temporal)   Ht 6' (1.829 m)   Wt 290 lb (131.5 kg)   SpO2 95%   BMI 39.33 kg/m    HGBA1c 7.7%       Assessment & Plan:   Kurt Blevins comes in today with chief complaint of Medical Management of Chronic Issues   Diagnosis and orders addressed:  1. Primary hypertension (Primary) Low sodium diet - CBC with Differential/Platelet - CMP14+EGFR  2. Persistent atrial fibrillation (HCC) Avoid caffeine  3. Hyperlipidemia associated with type 2 diabetes mellitus (HCC) Low fat diet - Lipid panel  4. Type 2 diabetes mellitus with diabetic polyneuropathy, without long-term current use of insulin (HCC) Stricter carb counting - Bayer DCA Hb A1c Waived  5. Neuropathy due to type 2 diabetes mellitus (HCC) Report any issues  6. COPD with asthma (HCC) Continue neb treatments  7. Severe obesity (BMI >= 40) (HCC) Discussed diet and exercise for person with BMI >25 Will recheck weight in 3-6 months    Labs pending Health Maintenance reviewed Diet and exercise encouraged  Follow up plan: 3 months   Mary-Margaret Gladis, FNP

## 2023-07-26 NOTE — Patient Instructions (Signed)
 Neuropathic Pain Neuropathic pain is pain caused by damage to the nerves that are responsible for certain sensations in your body (sensory nerves). Neuropathic pain can make you more sensitive to pain. Even a minor sensation can feel very painful. This is usually a long-term (chronic) condition that can be difficult to treat. The type of pain differs from person to person. It may: Start suddenly (acute), or it may develop slowly and become chronic. Come and go as damaged nerves heal, or it may stay at the same level for years. Cause emotional distress, loss of sleep, and a lower quality of life. What are the causes? The most common cause of this condition is diabetes. Many other diseases and conditions can also cause neuropathic pain. Causes of neuropathic pain can be classified as: Toxic. This is caused by medicines and chemicals. The most common causes of toxic neuropathic pain is damage from medicines that kill cancer cells (chemotherapy) or alcohol abuse. Metabolic. This can be caused by: Diabetes. Lack of vitamins like B12. Traumatic. Any injury that cuts, crushes, or stretches a nerve can cause damage and pain. Compression-related. If a sensory nerve gets trapped or compressed for a long period of time, the blood supply to the nerve can be cut off. Vascular. Many blood vessel diseases can cause neuropathic pain by decreasing blood supply and oxygen to nerves. Autoimmune. This type of pain results from diseases in which the body's defense system (immune system) mistakenly attacks sensory nerves. Examples of autoimmune diseases that can cause neuropathic pain include lupus and multiple sclerosis. Infectious. Many types of viral infections can damage sensory nerves and cause pain. Shingles infection is a common cause of this type of pain. Inherited. Neuropathic pain can be a symptom of many diseases that are passed down through families (genetic). What increases the risk? You are more likely to  develop this condition if: You have diabetes. You smoke. You drink too much alcohol. You are taking certain medicines, including chemotherapy or medicines that treat immune system disorders. What are the signs or symptoms? The main symptom is pain. Neuropathic pain is often described as: Burning. Shock-like. Stinging. Hot or cold. Itching. How is this diagnosed? No single test can diagnose neuropathic pain. It is diagnosed based on: A physical exam and your symptoms. Your health care provider will ask you about your pain. You may be asked to use a pain scale to describe how bad your pain is. Tests. These may be done to see if you have a cause and location of any nerve damage. They include: Nerve conduction studies and electromyography to test how well nerve signals travel through your nerves and muscles (electrodiagnostic testing). Skin biopsy to evaluate for small fiber neuropathy. Imaging studies, such as: X-rays. CT scan. MRI. How is this treated? Treatment for neuropathic pain may change over time. You may need to try different treatment options or a combination of treatments. Some options include: Treating the underlying cause of the neuropathy, such as diabetes, kidney disease, or vitamin deficiencies. Stopping medicines that can cause neuropathy, such as chemotherapy. Medicine to relieve pain. Medicines may include: Prescription or over-the-counter pain medicine. Anti-seizure medicine. Antidepressant medicines. Pain-relieving patches or creams that are applied to painful areas of skin. A medicine to numb the area (local anesthetic), which can be injected as a nerve block. Transcutaneous nerve stimulation. This uses electrical currents to block painful nerve signals. The treatment is painless. Alternative treatments, such as: Acupuncture. Meditation. Massage. Occupational or physical therapy. Pain management programs. Counseling. Follow  these instructions at  home: Medicines  Take over-the-counter and prescription medicines only as told by your health care provider. Ask your health care provider if the medicine prescribed to you: Requires you to avoid driving or using machinery. Can cause constipation. You may need to take these actions to prevent or treat constipation: Drink enough fluid to keep your urine pale yellow. Take over-the-counter or prescription medicines. Eat foods that are high in fiber, such as beans, whole grains, and fresh fruits and vegetables. Limit foods that are high in fat and processed sugars, such as fried or sweet foods. Lifestyle  Have a good support system at home. Consider joining a chronic pain support group. Do not use any products that contain nicotine or tobacco. These products include cigarettes, chewing tobacco, and vaping devices, such as e-cigarettes. If you need help quitting, ask your health care provider. Do not drink alcohol. General instructions Learn as much as you can about your condition. Work closely with all your health care providers to find the treatment plan that works best for you. Ask your health care provider what activities are safe for you. Keep all follow-up visits. This is important. Contact a health care provider if: Your pain treatments are not working. You are having side effects from your medicines. You are struggling with tiredness (fatigue), mood changes, depression, or anxiety. Get help right away if: You have thoughts of hurting yourself. Get help right away if you feel like you may hurt yourself or others, or have thoughts about taking your own life. Go to your nearest emergency room or: Call 911. Call the National Suicide Prevention Lifeline at 947-472-5826 or 988. This is open 24 hours a day. Text the Crisis Text Line at 657-070-3362. Summary Neuropathic pain is pain caused by damage to the nerves that are responsible for certain sensations in your body (sensory  nerves). Neuropathic pain may come and go as damaged nerves heal, or it may stay at the same level for years. Neuropathic pain is usually a long-term condition that can be difficult to treat. Consider joining a chronic pain support group. This information is not intended to replace advice given to you by your health care provider. Make sure you discuss any questions you have with your health care provider. Document Revised: 03/06/2021 Document Reviewed: 03/06/2021 Elsevier Patient Education  2024 [employer].

## 2023-07-27 LAB — CMP14+EGFR
ALT: 13 [IU]/L (ref 0–44)
AST: 14 [IU]/L (ref 0–40)
Albumin: 4.2 g/dL (ref 3.9–4.9)
Alkaline Phosphatase: 70 [IU]/L (ref 44–121)
BUN/Creatinine Ratio: 23 (ref 10–24)
BUN: 31 mg/dL — ABNORMAL HIGH (ref 8–27)
Bilirubin Total: 0.3 mg/dL (ref 0.0–1.2)
CO2: 21 mmol/L (ref 20–29)
Calcium: 9.7 mg/dL (ref 8.6–10.2)
Chloride: 101 mmol/L (ref 96–106)
Creatinine, Ser: 1.33 mg/dL — ABNORMAL HIGH (ref 0.76–1.27)
Globulin, Total: 2.3 g/dL (ref 1.5–4.5)
Glucose: 110 mg/dL — ABNORMAL HIGH (ref 70–99)
Potassium: 4.4 mmol/L (ref 3.5–5.2)
Sodium: 139 mmol/L (ref 134–144)
Total Protein: 6.5 g/dL (ref 6.0–8.5)
eGFR: 61 mL/min/{1.73_m2} (ref >59)

## 2023-07-27 LAB — CBC WITH DIFFERENTIAL/PLATELET
Basophils Absolute: 0.1 10*3/uL (ref 0.0–0.2)
Basos: 1 %
EOS (ABSOLUTE): 0.4 10*3/uL (ref 0.0–0.4)
Eos: 4 %
Hematocrit: 40 % (ref 37.5–51.0)
Hemoglobin: 13.6 g/dL (ref 13.0–17.7)
Immature Grans (Abs): 0 10*3/uL (ref 0.0–0.1)
Immature Granulocytes: 1 %
Lymphocytes Absolute: 2.4 10*3/uL (ref 0.7–3.1)
Lymphs: 29 %
MCH: 29.6 pg (ref 26.6–33.0)
MCHC: 34 g/dL (ref 31.5–35.7)
MCV: 87 fL (ref 79–97)
Monocytes Absolute: 0.7 10*3/uL (ref 0.1–0.9)
Monocytes: 8 %
Neutrophils Absolute: 5 10*3/uL (ref 1.4–7.0)
Neutrophils: 57 %
Platelets: 182 10*3/uL (ref 150–450)
RBC: 4.6 x10E6/uL (ref 4.14–5.80)
RDW: 12.4 % (ref 11.6–15.4)
WBC: 8.5 10*3/uL (ref 3.4–10.8)

## 2023-07-27 LAB — LIPID PANEL
Cholesterol, Total: 136 mg/dL (ref 100–199)
HDL: 33 mg/dL — ABNORMAL LOW (ref >39)
LDL CALC COMMENT:: 4.1 ratio (ref 0.0–5.0)
LDL Chol Calc (NIH): 67 mg/dL (ref 0–99)
Triglycerides: 221 mg/dL — ABNORMAL HIGH (ref 0–149)
VLDL Cholesterol Cal: 36 mg/dL (ref 5–40)

## 2023-07-29 DIAGNOSIS — R0602 Shortness of breath: Secondary | ICD-10-CM | POA: Diagnosis not present

## 2023-07-29 DIAGNOSIS — I10 Essential (primary) hypertension: Secondary | ICD-10-CM | POA: Diagnosis not present

## 2023-07-29 DIAGNOSIS — R072 Precordial pain: Secondary | ICD-10-CM | POA: Diagnosis not present

## 2023-07-29 DIAGNOSIS — J449 Chronic obstructive pulmonary disease, unspecified: Secondary | ICD-10-CM | POA: Diagnosis not present

## 2023-07-29 DIAGNOSIS — E782 Mixed hyperlipidemia: Secondary | ICD-10-CM | POA: Diagnosis not present

## 2023-07-29 DIAGNOSIS — I4819 Other persistent atrial fibrillation: Secondary | ICD-10-CM | POA: Diagnosis not present

## 2023-07-29 DIAGNOSIS — R9431 Abnormal electrocardiogram [ECG] [EKG]: Secondary | ICD-10-CM | POA: Diagnosis not present

## 2023-07-31 DIAGNOSIS — I4891 Unspecified atrial fibrillation: Secondary | ICD-10-CM | POA: Diagnosis not present

## 2023-07-31 DIAGNOSIS — J449 Chronic obstructive pulmonary disease, unspecified: Secondary | ICD-10-CM | POA: Diagnosis not present

## 2023-07-31 DIAGNOSIS — E7800 Pure hypercholesterolemia, unspecified: Secondary | ICD-10-CM | POA: Diagnosis not present

## 2023-07-31 DIAGNOSIS — R072 Precordial pain: Secondary | ICD-10-CM | POA: Diagnosis not present

## 2023-07-31 DIAGNOSIS — R9431 Abnormal electrocardiogram [ECG] [EKG]: Secondary | ICD-10-CM | POA: Diagnosis not present

## 2023-07-31 DIAGNOSIS — I10 Essential (primary) hypertension: Secondary | ICD-10-CM | POA: Diagnosis not present

## 2023-07-31 DIAGNOSIS — R0602 Shortness of breath: Secondary | ICD-10-CM | POA: Diagnosis not present

## 2023-08-02 ENCOUNTER — Ambulatory Visit: Payer: Medicare Other | Admitting: Cardiovascular Disease

## 2023-08-09 DIAGNOSIS — R072 Precordial pain: Secondary | ICD-10-CM | POA: Diagnosis not present

## 2023-08-09 DIAGNOSIS — I10 Essential (primary) hypertension: Secondary | ICD-10-CM | POA: Diagnosis not present

## 2023-08-09 DIAGNOSIS — I4819 Other persistent atrial fibrillation: Secondary | ICD-10-CM | POA: Diagnosis not present

## 2023-08-09 DIAGNOSIS — E782 Mixed hyperlipidemia: Secondary | ICD-10-CM | POA: Diagnosis not present

## 2023-08-09 DIAGNOSIS — Z7901 Long term (current) use of anticoagulants: Secondary | ICD-10-CM | POA: Diagnosis not present

## 2023-08-09 DIAGNOSIS — J449 Chronic obstructive pulmonary disease, unspecified: Secondary | ICD-10-CM | POA: Diagnosis not present

## 2023-08-13 DIAGNOSIS — I4819 Other persistent atrial fibrillation: Secondary | ICD-10-CM | POA: Diagnosis not present

## 2023-08-13 DIAGNOSIS — J9811 Atelectasis: Secondary | ICD-10-CM | POA: Diagnosis not present

## 2023-08-13 DIAGNOSIS — I7090 Unspecified atherosclerosis: Secondary | ICD-10-CM | POA: Diagnosis not present

## 2023-08-13 DIAGNOSIS — J439 Emphysema, unspecified: Secondary | ICD-10-CM | POA: Diagnosis not present

## 2023-09-09 DIAGNOSIS — Z01810 Encounter for preprocedural cardiovascular examination: Secondary | ICD-10-CM | POA: Diagnosis not present

## 2023-09-09 DIAGNOSIS — Z01812 Encounter for preprocedural laboratory examination: Secondary | ICD-10-CM | POA: Diagnosis not present

## 2023-09-11 DIAGNOSIS — Z7901 Long term (current) use of anticoagulants: Secondary | ICD-10-CM | POA: Diagnosis not present

## 2023-09-11 DIAGNOSIS — G4733 Obstructive sleep apnea (adult) (pediatric): Secondary | ICD-10-CM | POA: Diagnosis not present

## 2023-09-11 DIAGNOSIS — E782 Mixed hyperlipidemia: Secondary | ICD-10-CM | POA: Diagnosis not present

## 2023-09-11 DIAGNOSIS — J449 Chronic obstructive pulmonary disease, unspecified: Secondary | ICD-10-CM | POA: Diagnosis not present

## 2023-09-11 DIAGNOSIS — E119 Type 2 diabetes mellitus without complications: Secondary | ICD-10-CM | POA: Diagnosis not present

## 2023-09-11 DIAGNOSIS — Z79899 Other long term (current) drug therapy: Secondary | ICD-10-CM | POA: Diagnosis not present

## 2023-09-11 DIAGNOSIS — I10 Essential (primary) hypertension: Secondary | ICD-10-CM | POA: Diagnosis not present

## 2023-09-11 DIAGNOSIS — Z7984 Long term (current) use of oral hypoglycemic drugs: Secondary | ICD-10-CM | POA: Diagnosis not present

## 2023-09-11 DIAGNOSIS — I4819 Other persistent atrial fibrillation: Secondary | ICD-10-CM | POA: Diagnosis not present

## 2023-09-11 DIAGNOSIS — E039 Hypothyroidism, unspecified: Secondary | ICD-10-CM | POA: Diagnosis not present

## 2023-09-11 DIAGNOSIS — Z87891 Personal history of nicotine dependence: Secondary | ICD-10-CM | POA: Diagnosis not present

## 2023-09-12 DIAGNOSIS — E039 Hypothyroidism, unspecified: Secondary | ICD-10-CM | POA: Diagnosis not present

## 2023-09-12 DIAGNOSIS — Z7901 Long term (current) use of anticoagulants: Secondary | ICD-10-CM | POA: Diagnosis not present

## 2023-09-12 DIAGNOSIS — Z7984 Long term (current) use of oral hypoglycemic drugs: Secondary | ICD-10-CM | POA: Diagnosis not present

## 2023-09-12 DIAGNOSIS — I4819 Other persistent atrial fibrillation: Secondary | ICD-10-CM | POA: Diagnosis not present

## 2023-09-12 DIAGNOSIS — I10 Essential (primary) hypertension: Secondary | ICD-10-CM | POA: Diagnosis not present

## 2023-09-12 DIAGNOSIS — E782 Mixed hyperlipidemia: Secondary | ICD-10-CM | POA: Diagnosis not present

## 2023-09-12 DIAGNOSIS — Z79899 Other long term (current) drug therapy: Secondary | ICD-10-CM | POA: Diagnosis not present

## 2023-09-12 DIAGNOSIS — J449 Chronic obstructive pulmonary disease, unspecified: Secondary | ICD-10-CM | POA: Diagnosis not present

## 2023-09-12 DIAGNOSIS — Z87891 Personal history of nicotine dependence: Secondary | ICD-10-CM | POA: Diagnosis not present

## 2023-09-12 DIAGNOSIS — G4733 Obstructive sleep apnea (adult) (pediatric): Secondary | ICD-10-CM | POA: Diagnosis not present

## 2023-09-12 DIAGNOSIS — E119 Type 2 diabetes mellitus without complications: Secondary | ICD-10-CM | POA: Diagnosis not present

## 2023-10-10 ENCOUNTER — Ambulatory Visit: Payer: Medicare Other

## 2023-10-10 VITALS — BP 123/60 | HR 60 | Ht 73.0 in | Wt 290.0 lb

## 2023-10-10 DIAGNOSIS — Z0000 Encounter for general adult medical examination without abnormal findings: Secondary | ICD-10-CM

## 2023-10-10 DIAGNOSIS — Z1211 Encounter for screening for malignant neoplasm of colon: Secondary | ICD-10-CM

## 2023-10-10 NOTE — Progress Notes (Addendum)
 Subjective:   Kurt Blevins is a 63 y.o. who presents for a Medicare Wellness preventive visit.  Visit Complete: In person  VideoDeclined- This patient declined Interactive audio and Acupuncturist. Therefore the visit was completed with audio only.  Persons Participating in Visit: Patient.  AWV Questionnaire: No: Patient Medicare AWV questionnaire was not completed prior to this visit.  Cardiac Risk Factors include: advanced age (>30men, >22 women);male gender;hypertension;diabetes mellitus;dyslipidemia     Objective:    Today's Vitals   10/10/23 1121  BP: 123/60  Pulse: 60  Weight: 290 lb (131.5 kg)  Height: 6\' 1"  (1.854 m)   Body mass index is 38.26 kg/m.     10/10/2023   11:28 AM 05/21/2023    6:37 AM 10/04/2022   10:34 AM 10/02/2021   12:00 PM 09/29/2020    1:58 PM 09/29/2019    8:41 AM 09/25/2018    9:02 AM  Advanced Directives  Does Patient Have a Medical Advance Directive? Yes Yes Yes No No No No  Type of Estate agent of [place];Living will Healthcare Power of [employer] of [place];Living will      Copy of Healthcare Power of Attorney in Chart? No - copy requested  No - copy requested      Would patient like information on creating a medical advance directive?    No - Patient declined No - Patient declined No - Patient declined Yes (MAU/Ambulatory/Procedural Areas - Information given)    Current Medications (verified) Outpatient Encounter Medications as of 10/10/2023  Medication Sig   apixaban (ELIQUIS) 5 MG TABS tablet Take 1 tablet (5 mg total) by mouth 2 (two) times daily.   atorvastatin (LIPITOR) 40 MG tablet Take 1 tablet (40 mg total) by mouth daily.   fenofibrate 160 MG tablet Take 1 tablet (160 mg total) by mouth daily.   Fluticasone-Umeclidin-Vilant (TRELEGY ELLIPTA) 100-62.5-25 MCG/ACT AEPB Inhale 1 puff into the lungs daily.   glipiZIDE (GLUCOTROL) 5 MG tablet Take 1 tablet (5 mg total) by mouth 2 (two)  times daily before a meal.   glucose blood (ONETOUCH VERIO) test strip Check BS one daily Dx E11.42   ipratropium-albuterol (DUONEB) 0.5-2.5 (3) MG/3ML SOLN USE 1 VIAL VIA NEBULIZER 3 TIMES DAILY AS NEEDED FOR SHORTNESS OF BREATH   lisinopril-hydrochlorothiazide (ZESTORETIC) 20-25 MG tablet Take 1 tablet by mouth daily.   metFORMIN (GLUCOPHAGE) 1000 MG tablet TAKE ONE TABLET TWICE DAILY WITH A MEAL.   OneTouch Delica Lancets 33G MISC Check blood sugar 1x per day and prn  E 11.9   diltiazem (CARDIZEM SR) 120 MG 12 hr capsule Take 1 capsule (120 mg total) by mouth 2 (two) times daily.   No facility-administered encounter medications on file as of 10/10/2023.    Allergies (verified) Penicillins, Egg-derived products, Metoprolol, and Peanut-containing drug products   History: Past Medical History:  Diagnosis Date   COPD (chronic obstructive pulmonary disease) (HCC)    Hyperlipidemia    Hyperlipidemia 11/26/2012   Hypertension    Neuropathy    patient reported   Past Surgical History:  Procedure Laterality Date   BACK SURGERY  1990s   CARDIOVERSION N/A 05/21/2023   Procedure: CARDIOVERSION;  Surgeon: Thomasene Ripple, DO;  Location: [facility];  Service: Cardiovascular;  Laterality: N/A;   COLONOSCOPY N/A 02/25/2018   Procedure: COLONOSCOPY;  Surgeon: Sherrilyn Rist, MD;  Location: [facility];  Service: Gastroenterology;  Laterality: N/A;   HERNIA REPAIR     NECK SURGERY  POLYPECTOMY  02/25/2018   Procedure: POLYPECTOMY;  Surgeon: Sherrilyn Rist, MD;  Location: [facility];  Service: Gastroenterology;;   TONSILLECTOMY     Family History  Problem Relation Age of Onset   Diabetes Mother    Hypertension Mother    Hyperlipidemia Mother    Arthritis Mother    Osteoporosis Mother    Diabetes Father    Hypertension Father    Hyperlipidemia Father    Heart attack Father 73       Late 80s and 34s   Cancer Paternal Grandmother        colon   Healthy Sister    Healthy  Brother    Healthy Daughter    Healthy Son    Healthy Son    Healthy Son    Social History   Socioeconomic History   Marital status: Married    Spouse name: Zella Ball   Number of children: 4   Years of education: Not on file   Highest education level: 11th grade  Occupational History   Occupation: Disabled    Comment: Back problems  Tobacco Use   Smoking status: Former    Current packs/day: 0.00    Average packs/day: 1 pack/day for 10.0 years (10.0 ttl pk-yrs)    Types: Cigarettes    Start date: 11/27/1998    Quit date: 11/26/2008    Years since quitting: 14.8   Smokeless tobacco: Never  Vaping Use   Vaping status: Never Used  Substance and Sexual Activity   Alcohol use: No   Drug use: No   Sexual activity: Not on file  Other Topics Concern   Not on file  Social History Narrative   Not on file   Social Drivers of Health   Financial Resource Strain: Low Risk  (10/10/2023)   Overall Financial Resource Strain (CARDIA)    Difficulty of Paying Living Expenses: Not hard at all  Food Insecurity: No Food Insecurity (10/10/2023)   Hunger Vital Sign    Worried About Running Out of Food in the Last Year: Never true    Ran Out of Food in the Last Year: Never true  Transportation Needs: No Transportation Needs (09/12/2023)   Received from [facility] - Transportation    Lack of Transportation (Medical): No    Lack of Transportation (Non-Medical): No  Physical Activity: Insufficiently Active (10/10/2023)   Exercise Vital Sign    Days of Exercise per Week: 3 days    Minutes of Exercise per Session: 30 min  Stress: No Stress Concern Present (10/10/2023)   [employer] of Occupational Health - Occupational Stress Questionnaire    Feeling of Stress : Not at all  Recent Concern: Stress - Stress Concern Present (09/11/2023)   Received from [facility] of Occupational Health - Occupational Stress Questionnaire    Feeling of Stress : To some extent   Social Connections: Socially Integrated (10/10/2023)   Social Connection and Isolation Panel [NHANES]    Frequency of Communication with Friends and Family: More than three times a week    Frequency of Social Gatherings with Friends and Family: More than three times a week    Attends Religious Services: More than 4 times per year    Active Member of [employer] or Organizations: Yes    Attends Engineer, structural: More than 4 times per year    Marital Status: Married    Tobacco Counseling Counseling given: Yes    Clinical Intake:  Pre-visit preparation completed: Yes  Pain : No/denies pain     BMI - recorded: 38.26 Nutritional Status: BMI > 30  Obese Nutritional Risks: None Diabetes: Yes CBG done?: No  Lab Results  Component Value Date   HGBA1C 7.7 (H) 07/26/2023   HGBA1C 7.2 (H) 04/23/2023   HGBA1C 7.8 (H) 01/07/2023     How often do you need to have someone help you when you read instructions, pamphlets, or other written materials from your doctor or pharmacy?: 1 - Never  Interpreter Needed?: No  Information entered by :: Cleophus Molt, cma   Activities of Daily Living     10/10/2023    3:52 PM 10/10/2023   11:28 AM  In your present state of health, do you have any difficulty performing the following activities:  Hearing? 1 1  Comment  both ears, need hearing aids  Vision?  0  Difficulty concentrating or making decisions?  0  Walking or climbing stairs?  0  Dressing or bathing?  0  Doing errands, shopping?  0  Preparing Food and eating ?  N  Using the Toilet?  N  In the past six months, have you accidently leaked urine?  N  Do you have problems with loss of bowel control?  N  Managing your Medications?  N  Managing your Finances?  N  Housekeeping or managing your Housekeeping?  N    Patient Care Team: Bennie Pierini, FNP as PCP - General (Nurse Practitioner) Sherrilyn Rist, MD as Consulting Physician (Gastroenterology)  Indicate any  recent Medical Services you may have received from other than [facility] providers in the past year (date may be approximate).     Assessment:   This is a routine wellness examination for Errol.  Hearing/Vision screen Hearing Screening - Comments:: both ears, need hearing aids   Goals Addressed               This Visit's Progress     Weight (lb) < 285 lb (129.3 kg) (pt-stated)   290 lb (131.5 kg)     He has cut back on carbs and sugar. He has lost 35 pounds in last 6 months.      Weight (lb) < 300 lb (136.1 kg) (pt-stated)   290 lb (131.5 kg)     Continue to make healthier food choices and increase walking to at least 45 minutes per day.        Depression Screen     10/10/2023   11:39 AM 07/26/2023   10:41 AM 07/26/2023   10:31 AM 04/23/2023    8:13 AM 01/07/2023    2:23 PM 10/08/2022    2:58 PM 10/04/2022   10:32 AM  PHQ 2/9 Scores  PHQ - 2 Score 0 0 0 0 0 0 0  PHQ- 9 Score    0 0 0     Fall Risk     10/10/2023    3:54 PM 07/26/2023   10:41 AM 07/26/2023   10:31 AM 04/23/2023    8:13 AM 01/07/2023    2:23 PM  Fall Risk   Falls in the past year? 1 0 0 0 0  Number falls in past yr: 0      Injury with Fall? 1      Risk for fall due to : History of fall(s);Impaired balance/gait;Orthopedic patient      Follow up Falls prevention discussed;Falls evaluation completed        MEDICARE RISK AT HOME:  Medicare  Risk at Home Any stairs in or around the home?: No If so, are there any without handrails?: No Home free of loose throw rugs in walkways, pet beds, electrical cords, etc?: Yes Adequate lighting in your home to reduce risk of falls?: Yes Life alert?: No Use of a cane, walker or w/c?: No Grab bars in the bathroom?: Yes Shower chair or bench in shower?: Yes Elevated toilet seat or a handicapped toilet?: No  TIMED UP AND GO:  Was the test performed?  No  Cognitive Function: 6CIT completed    09/25/2018   10:13 AM 06/10/2017    3:35 PM  MMSE - Mini Mental State Exam   Orientation to time 5 5  Orientation to Place 4 5  Registration 3 3  Attention/ Calculation 0 0  Attention/Calculation-comments Patient did not attempt not attempted  Recall 2 3  Language- name 2 objects 2 2  Language- repeat 1 1  Language- follow 3 step command 3 3  Language- read & follow direction 1 1  Write a sentence 0 0  Copy design 1 0  Total score 22 23        10/10/2023   11:42 AM 10/10/2023   11:41 AM 10/04/2022   10:35 AM 09/29/2020    2:00 PM 09/29/2019    9:05 AM  6CIT Screen  What Year?  0 points 0 points 0 points 0 points  What month?  0 points 0 points 0 points 0 points  What time?  0 points 0 points 0 points 0 points  Count back from 20  0 points 0 points 0 points 0 points  Months in reverse 4 points 4 points 0 points 0 points 0 points  Repeat phrase  0 points 0 points 0 points 2 points  Total Score  4 points 0 points 0 points 2 points    Immunizations Immunization History  Administered Date(s) Administered   PFIZER(Purple Top)SARS-COV-2 Vaccination 12/08/2019, 12/31/2019, 05/01/2021   Tdap 02/01/2014    Screening Tests Health Maintenance  Topic Date Due   Pneumococcal Vaccine 26-51 Years old (1 of 2 - PCV) Never done   HIV Screening  Never done   Colonoscopy  02/26/2019   INFLUENZA VACCINE  10/21/2023 (Originally 02/21/2023)   Zoster Vaccines- Shingrix (1 of 2) 10/24/2023 (Originally 10/16/1979)   COVID-19 Vaccine (4 - 2024-25 season) 11/04/2024 (Originally 03/24/2023)   FOOT EXAM  01/07/2024   OPHTHALMOLOGY EXAM  01/21/2024   HEMOGLOBIN A1C  01/23/2024   DTaP/Tdap/Td (2 - Td or Tdap) 02/02/2024   Diabetic kidney evaluation - Urine ACR  04/22/2024   Diabetic kidney evaluation - eGFR measurement  07/25/2024   Medicare Annual Wellness (AWV)  10/09/2024   Hepatitis C Screening  Completed   HPV VACCINES  Aged Out    Health Maintenance  Health Maintenance Due  Topic Date Due   Pneumococcal Vaccine 15-87 Years old (1 of 2 - PCV) Never done   HIV  Screening  Never done   Colonoscopy  02/26/2019   Health Maintenance Items Addressed: See Nurse Notes  Additional Screening:  Vision Screening: Recommended annual ophthalmology exams for early detection of glaucoma and other disorders of the eye.  Dental Screening: Recommended annual dental exams for proper oral hygiene  Community Resource Referral / Chronic Care Management: CRR required this visit?  No   CCM required this visit?  No     Plan:     I have personally reviewed and noted the following in the patient's chart:  Medical and social history Use of alcohol, tobacco or illicit drugs  Current medications and supplements including opioid prescriptions. Patient is not currently taking opioid prescriptions. Functional ability and status Nutritional status Physical activity Advanced directives List of other physicians Hospitalizations, surgeries, and ER visits in previous 12 months Vitals Screenings to include cognitive, depression, and falls Referrals and appointments  In addition, I have reviewed and discussed with patient certain preventive protocols, quality metrics, and best practice recommendations. A written personalized care plan for preventive services as well as general preventive health recommendations were provided to patient.     Arta Silence, CMA   10/10/2023   After Visit Summary: (Declined) Due to this being a telephonic visit, with patients personalized plan was offered to patient but patient Declined AVS at this time   Notes:  Encouraged pt to make sure to get his Pneumonia, Shingles, and Flu vaccines done at next office Visit, pt aware. Pt is also due for a Colonoscopy, referral sent to GI, pt aware.

## 2023-10-10 NOTE — Patient Instructions (Signed)
 Mr. Kurt Blevins , Thank you for taking time to come for your Medicare Wellness Visit. I appreciate your ongoing commitment to your health goals. Please review the following plan we discussed and let me know if I can assist you in the future.   Referrals/Orders/Follow-Ups/Clinician Recommendations: As a reminder don't forget make your appointment for your colonoscopy. Plus get the rest of your vaccines at your next office visit: Pneumonia, Shingles and flu.  This is a list of the screening recommended for you and due dates:  Health Maintenance  Topic Date Due   Pneumococcal Vaccination (1 of 2 - PCV) Never done   HIV Screening  Never done   Colon Cancer Screening  02/26/2019   COVID-19 Vaccine (4 - 2024-25 season) 03/24/2023   Medicare Annual Wellness Visit  10/04/2023   Flu Shot  10/21/2023*   Zoster (Shingles) Vaccine (1 of 2) 10/24/2023*   Complete foot exam   01/07/2024   Eye exam for diabetics  01/21/2024   Hemoglobin A1C  01/23/2024   DTaP/Tdap/Td vaccine (2 - Td or Tdap) 02/02/2024   Yearly kidney health urinalysis for diabetes  04/22/2024   Yearly kidney function blood test for diabetes  07/25/2024   Hepatitis C Screening  Completed   HPV Vaccine  Aged Out  *Topic was postponed. The date shown is not the original due date.    Advanced directives: (Declined) Advance directive discussed with you today. Even though you declined this today, please call our office should you change your mind, and we can give you the proper paperwork for you to fill out.  Next Medicare Annual Wellness Visit scheduled for next year: Yes

## 2023-10-17 ENCOUNTER — Telehealth: Payer: Self-pay | Admitting: Gastroenterology

## 2023-10-17 NOTE — Telephone Encounter (Signed)
 Inbound call from patient wanting to schedule hospital procedure.   Please advise. Thank you

## 2023-10-18 NOTE — Telephone Encounter (Signed)
 Left message for pt to call back

## 2023-10-18 NOTE — Telephone Encounter (Addendum)
 Dr Myrtie Neither, do you want his to have an office visit before scheduling his colonoscopy?

## 2023-10-18 NOTE — Telephone Encounter (Signed)
 He is overdue for a surveillance colonoscopy for history of colon polyps.  He has active cardiac issues regarding his atrial fibrillation, and underwent an ablation in outside health system last month. Patient typically cannot be off oral anticoagulation for a minimum of 3 months after an ablation. Due to his medical issues, he needs to be seen in clinic before scheduling a hospital colonoscopy. Please give next available appointment with one of my pod APP's.  Please also tell him that, if not already scheduled, he needs to see his cardiologist in follow-up after this recent A-fib ablation.  He needs that pre-endoscopy cardiac evaluation including a determination of how long he will be on [facility] and when he could be briefly held for an endoscopic procedure.  Ellwood Dense MD

## 2023-10-21 NOTE — Telephone Encounter (Signed)
 Patient wife is returning a call. They stated that they  would like to know if they are needing to schedule an office visit and how much the prep medication cost. Patient wife is requesting a speak to the nurse. Please advise.

## 2023-10-22 ENCOUNTER — Encounter: Payer: Self-pay | Admitting: Nurse Practitioner

## 2023-10-22 ENCOUNTER — Ambulatory Visit (INDEPENDENT_AMBULATORY_CARE_PROVIDER_SITE_OTHER): Payer: Medicare Other | Admitting: Nurse Practitioner

## 2023-10-22 VITALS — BP 120/50 | HR 65 | Temp 97.5°F | Ht 73.0 in | Wt 295.0 lb

## 2023-10-22 DIAGNOSIS — E785 Hyperlipidemia, unspecified: Secondary | ICD-10-CM | POA: Diagnosis not present

## 2023-10-22 DIAGNOSIS — Z125 Encounter for screening for malignant neoplasm of prostate: Secondary | ICD-10-CM | POA: Diagnosis not present

## 2023-10-22 DIAGNOSIS — E1142 Type 2 diabetes mellitus with diabetic polyneuropathy: Secondary | ICD-10-CM | POA: Diagnosis not present

## 2023-10-22 DIAGNOSIS — E1169 Type 2 diabetes mellitus with other specified complication: Secondary | ICD-10-CM

## 2023-10-22 DIAGNOSIS — Z7984 Long term (current) use of oral hypoglycemic drugs: Secondary | ICD-10-CM

## 2023-10-22 DIAGNOSIS — I10 Essential (primary) hypertension: Secondary | ICD-10-CM

## 2023-10-22 DIAGNOSIS — Z6838 Body mass index (BMI) 38.0-38.9, adult: Secondary | ICD-10-CM

## 2023-10-22 LAB — BAYER DCA HB A1C WAIVED: HB A1C (BAYER DCA - WAIVED): 6.9 % — ABNORMAL HIGH (ref 4.8–5.6)

## 2023-10-22 LAB — LIPID PANEL

## 2023-10-22 MED ORDER — GLIPIZIDE 5 MG PO TABS: 5.0000 mg | ORAL_TABLET | Freq: Two times a day (BID) | ORAL | 1 refills | Status: DC

## 2023-10-22 MED ORDER — LISINOPRIL-HYDROCHLOROTHIAZIDE 20-25 MG PO TABS: 1.0000 | ORAL_TABLET | Freq: Every day | ORAL | 1 refills | Status: DC

## 2023-10-22 MED ORDER — ATORVASTATIN CALCIUM 40 MG PO TABS: 40.0000 mg | ORAL_TABLET | Freq: Every day | ORAL | 1 refills | Status: DC

## 2023-10-22 MED ORDER — METFORMIN HCL 1000 MG PO TABS: ORAL_TABLET | ORAL | 1 refills | Status: DC

## 2023-10-22 MED ORDER — FENOFIBRATE 160 MG PO TABS: 160.0000 mg | ORAL_TABLET | Freq: Every day | ORAL | 1 refills | Status: DC

## 2023-10-22 NOTE — Progress Notes (Signed)
 Subjective:    Patient ID: Kurt Blevins, male    DOB: [birth year], 62 y.o.   MRN: [id number]   Chief Complaint: annual physical    HPI:  Kurt Blevins is a 63 y.o. who identifies as a male who was assigned male at birth.   Social history: Lives with: wife Work history: disability   Comes in today for follow up of the following chronic medical issues:  1. Primary hypertension No c/o chest pain, sob or headache. Does check blood pressure at home. Usually runs in the 120 systolic. BP Readings from Last 3 Encounters:  10/10/23 123/60  07/26/23 138/84  06/06/23 (!) 140/76     2. Persistent atrial fib Denies heart racing and or palpitations  3. Hyperlipidemia associated with type 2 diabetes mellitus (HCC) Does not watch diet very closely and does no dedicated exercise. Lab Results  Component Value Date   CHOL 136 07/26/2023   HDL 33 (L) 07/26/2023   LDLCALC 67 07/26/2023   TRIG 221 (H) 07/26/2023   CHOLHDL 4.1 07/26/2023     4. Type 2 diabetes mellitus with diabetic polyneuropathy, without long-term current use of insulin (HCC) Fasting blood sugars are running around 130-150. No low blood sugars Lab Results  Component Value Date   HGBA1C 7.7 (H) 07/26/2023     5. Neuropathy due to type 2 diabetes mellitus (HCC) Feet stay numb with occasional burning  6. COPD with asthma (HCC) No cough- wheezing- thinks is from polen  7. Severe obesity (BMI >= 40) (HCC) Weight is up 5 lbs  Wt Readings from Last 3 Encounters:  10/22/23 295 lb (133.8 kg)  10/10/23 290 lb (131.5 kg)  07/26/23 290 lb (131.5 kg)   BMI Readings from Last 3 Encounters:  10/22/23 38.92 kg/m  10/10/23 38.26 kg/m  07/26/23 39.33 kg/m         New complaints: None today  Allergies  Allergen Reactions   Penicillins Anaphylaxis and Other (See Comments)    Whelps Has patient had a PCN reaction causing immediate rash, facial/tongue/throat swelling, SOB or lightheadedness with  hypotension: Yes Has patient had a PCN reaction causing severe rash involving mucus membranes or skin necrosis: Yes Has patient had a PCN reaction that required hospitalization: Yes Has patient had a PCN reaction occurring within the last 10 years: No If all of the above answers are "NO", then may proceed with Cephalosporin use.    Egg-Derived Products Nausea Only and Other (See Comments)    Stomach cramps   Metoprolol     Severe headache, caused shoulder, neck and arm pain   Peanut-Containing Drug Products Hives and Itching   Outpatient Encounter Medications as of 10/22/2023  Medication Sig   apixaban (ELIQUIS) 5 MG TABS tablet Take 1 tablet (5 mg total) by mouth 2 (two) times daily.   atorvastatin (LIPITOR) 40 MG tablet Take 1 tablet (40 mg total) by mouth daily.   diltiazem (CARDIZEM SR) 120 MG 12 hr capsule Take 1 capsule (120 mg total) by mouth 2 (two) times daily.   fenofibrate 160 MG tablet Take 1 tablet (160 mg total) by mouth daily.   Fluticasone-Umeclidin-Vilant (TRELEGY ELLIPTA) 100-62.5-25 MCG/ACT AEPB Inhale 1 puff into the lungs daily.   glipiZIDE (GLUCOTROL) 5 MG tablet Take 1 tablet (5 mg total) by mouth 2 (two) times daily before a meal.   glucose blood (ONETOUCH VERIO) test strip Check BS one daily Dx E11.42   ipratropium-albuterol (DUONEB) 0.5-2.5 (3) MG/3ML SOLN USE 1 VIAL VIA  NEBULIZER 3 TIMES DAILY AS NEEDED FOR SHORTNESS OF BREATH   lisinopril-hydrochlorothiazide (ZESTORETIC) 20-25 MG tablet Take 1 tablet by mouth daily.   metFORMIN (GLUCOPHAGE) 1000 MG tablet TAKE ONE TABLET TWICE DAILY WITH A MEAL.   OneTouch Delica Lancets 33G MISC Check blood sugar 1x per day and prn  E 11.9   No facility-administered encounter medications on file as of 10/22/2023.    Past Surgical History:  Procedure Laterality Date   BACK SURGERY  1990s   CARDIOVERSION N/A 05/21/2023   Procedure: CARDIOVERSION;  Surgeon: Thomasene Ripple, DO;  Location: [facility];  Service:  Cardiovascular;  Laterality: N/A;   COLONOSCOPY N/A 02/25/2018   Procedure: COLONOSCOPY;  Surgeon: Sherrilyn Rist, MD;  Location: [facility];  Service: Gastroenterology;  Laterality: N/A;   HERNIA REPAIR     NECK SURGERY     POLYPECTOMY  02/25/2018   Procedure: POLYPECTOMY;  Surgeon: Sherrilyn Rist, MD;  Location: [facility];  Service: Gastroenterology;;   TONSILLECTOMY      Family History  Problem Relation Age of Onset   Diabetes Mother    Hypertension Mother    Hyperlipidemia Mother    Arthritis Mother    Osteoporosis Mother    Diabetes Father    Hypertension Father    Hyperlipidemia Father    Heart attack Father 53       Late 46s and 29s   Cancer Paternal Grandmother        colon   Healthy Sister    Healthy Brother    Healthy Daughter    Healthy Son    Healthy Son    Healthy Son       Controlled substance contract: n/a     Review of Systems  Constitutional:  Negative for diaphoresis.  Eyes:  Negative for pain.  Respiratory:  Negative for shortness of breath.   Cardiovascular:  Negative for chest pain, palpitations and leg swelling.  Gastrointestinal:  Negative for abdominal pain.  Endocrine: Negative for polydipsia.  Skin:  Negative for rash.  Neurological:  Negative for dizziness, weakness and headaches.  Hematological:  Does not bruise/bleed easily.  All other systems reviewed and are negative.      Objective:   Physical Exam Vitals and nursing note reviewed.  Constitutional:      Appearance: Normal appearance. He is well-developed.  HENT:     Head: Normocephalic.     Nose: Nose normal.     Mouth/Throat:     Mouth: Mucous membranes are moist.     Pharynx: Oropharynx is clear.  Eyes:     Pupils: Pupils are equal, round, and reactive to light.  Neck:     Thyroid: No thyroid mass or thyromegaly.     Vascular: No carotid bruit or JVD.     Trachea: Phonation normal.  Cardiovascular:     Rate and Rhythm: Normal rate. Rhythm irregular.   Pulmonary:     Effort: Pulmonary effort is normal. No respiratory distress.     Breath sounds: Normal breath sounds.  Abdominal:     General: Bowel sounds are normal.     Palpations: Abdomen is soft.     Tenderness: There is no abdominal tenderness.  Musculoskeletal:        General: Normal range of motion.     Cervical back: Normal range of motion and neck supple.  Lymphadenopathy:     Cervical: No cervical adenopathy.  Skin:    General: Skin is warm and dry.  Neurological:     Mental Status: He is alert and oriented to person, place, and time.  Psychiatric:        Behavior: Behavior normal.        Thought Content: Thought content normal.        Judgment: Judgment normal.    BP (!) 120/50   Pulse 65   Temp (!) 97.5 F (36.4 C) (Temporal)   Ht 6\' 1"  (1.854 m)   Wt 295 lb (133.8 kg)   SpO2 95%   BMI 38.92 kg/m     HGBA1c 6.9%       Assessment & Plan:   BULMARO FEAGANS comes in today with chief complaint of No chief complaint on file.   Diagnosis and orders addressed:  1. Primary hypertension (Primary) Low sodium diet - CBC with Differential/Platelet - CMP14+EGFR  2. Persistent atrial fibrillation (HCC) Avoid caffeine  3. Hyperlipidemia associated with type 2 diabetes mellitus (HCC) Low fat diet - Lipid panel  4. Type 2 diabetes mellitus with diabetic polyneuropathy, without long-term current use of insulin (HCC) Stricter carb counting - Bayer DCA Hb A1c Waived  5. Neuropathy due to type 2 diabetes mellitus (HCC) Report any issues  6. COPD with asthma (HCC) Continue neb treatments  7. Severe obesity (BMI >= 40) (HCC) Discussed diet and exercise for person with BMI >25 Will recheck weight in 3-6 months  Meds ordered this encounter  Medications   atorvastatin (LIPITOR) 40 MG tablet    Sig: Take 1 tablet (40 mg total) by mouth daily.    Dispense:  90 tablet    Refill:  1    This prescription was filled on 01/08/2023. Any refills authorized will  be placed on file.    Supervising Provider:   Mechele Claude [phone]   fenofibrate 160 MG tablet    Sig: Take 1 tablet (160 mg total) by mouth daily.    Dispense:  90 tablet    Refill:  1    This prescription was filled on 01/08/2023. Any refills authorized will be placed on file.    Supervising Provider:   Mechele Claude [phone]   lisinopril-hydrochlorothiazide (ZESTORETIC) 20-25 MG tablet    Sig: Take 1 tablet by mouth daily.    Dispense:  90 tablet    Refill:  1    This prescription was filled on 01/08/2023. Any refills authorized will be placed on file.    Supervising Provider:   Mechele Claude [phone]   glipiZIDE (GLUCOTROL) 5 MG tablet    Sig: Take 1 tablet (5 mg total) by mouth 2 (two) times daily before a meal.    Dispense:  180 tablet    Refill:  1    This prescription was filled on 01/08/2023. Any refills authorized will be placed on file.    Supervising Provider:   Mechele Claude [phone]   metFORMIN (GLUCOPHAGE) 1000 MG tablet    Sig: TAKE ONE TABLET TWICE DAILY WITH A MEAL.    Dispense:  180 tablet    Refill:  1    This prescription was filled on 01/08/2023. Any refills authorized will be placed on file.    Supervising Provider:   Mechele Claude [phone]     Labs pending Health Maintenance reviewed Diet and exercise encouraged  Follow up plan: 3 months   Mary-Margaret Daphine Deutscher, FNP

## 2023-10-22 NOTE — Patient Instructions (Signed)

## 2023-10-22 NOTE — Telephone Encounter (Signed)
 Yes, he does need an office visit. He can see Shanda Bumps or Victorino Dike.  Called to discuss. No answer. Left a message to call us back.

## 2023-10-23 LAB — CMP14+EGFR
ALT: 14 IU/L (ref 0–44)
AST: 15 IU/L (ref 0–40)
Albumin: 4.3 g/dL (ref 3.9–4.9)
Alkaline Phosphatase: 76 IU/L (ref 44–121)
BUN/Creatinine Ratio: 15 (ref 10–24)
BUN: 19 mg/dL (ref 8–27)
Bilirubin Total: 0.2 mg/dL (ref 0.0–1.2)
CO2: 21 mmol/L (ref 20–29)
Calcium: 9.3 mg/dL (ref 8.6–10.2)
Chloride: 101 mmol/L (ref 96–106)
Creatinine, Ser: 1.26 mg/dL (ref 0.76–1.27)
Globulin, Total: 1.9 g/dL (ref 1.5–4.5)
Glucose: 138 mg/dL — ABNORMAL HIGH (ref 70–99)
Potassium: 4.1 mmol/L (ref 3.5–5.2)
Sodium: 139 mmol/L (ref 134–144)
Total Protein: 6.2 g/dL (ref 6.0–8.5)
eGFR: 64 mL/min/{1.73_m2} (ref >59)

## 2023-10-23 LAB — CBC WITH DIFFERENTIAL/PLATELET
Basophils Absolute: 0.1 10*3/uL (ref 0.0–0.2)
Basos: 1 %
EOS (ABSOLUTE): 0.4 10*3/uL (ref 0.0–0.4)
Eos: 6 %
Hematocrit: 41.5 % (ref 37.5–51.0)
Hemoglobin: 13.9 g/dL (ref 13.0–17.7)
Immature Grans (Abs): 0.1 10*3/uL (ref 0.0–0.1)
Immature Granulocytes: 1 %
Lymphocytes Absolute: 2 10*3/uL (ref 0.7–3.1)
Lymphs: 29 %
MCH: 29.4 pg (ref 26.6–33.0)
MCHC: 33.5 g/dL (ref 31.5–35.7)
MCV: 88 fL (ref 79–97)
Monocytes Absolute: 0.5 10*3/uL (ref 0.1–0.9)
Monocytes: 8 %
Neutrophils Absolute: 3.8 10*3/uL (ref 1.4–7.0)
Neutrophils: 55 %
Platelets: 280 10*3/uL (ref 150–450)
RBC: 4.73 x10E6/uL (ref 4.14–5.80)
RDW: 12.5 % (ref 11.6–15.4)
WBC: 6.9 10*3/uL (ref 3.4–10.8)

## 2023-10-23 LAB — LIPID PANEL
Cholesterol, Total: 130 mg/dL (ref 100–199)
HDL: 34 mg/dL — ABNORMAL LOW (ref >39)
LDL CALC COMMENT:: 3.8 ratio (ref 0.0–5.0)
LDL Chol Calc (NIH): 68 mg/dL (ref 0–99)
Triglycerides: 161 mg/dL — ABNORMAL HIGH (ref 0–149)
VLDL Cholesterol Cal: 28 mg/dL (ref 5–40)

## 2023-10-23 LAB — VITAMIN B12: Vitamin B-12: 363 pg/mL (ref 232–1245)

## 2023-10-23 NOTE — Telephone Encounter (Signed)
 Called again. No answer. Left the following information on the voicemail; 1) be current on his cardiology appointments because we will need cardiology clearance for the procedure and to stop any blood thinners 2) make an office visit with [place] so we can assess him as well  If we have a sample prep, we can offer it to him.

## 2023-10-24 LAB — PSA: Prostate Specific Ag, Serum: 1.2 ng/mL (ref 0.0–4.0)

## 2023-10-24 LAB — SPECIMEN STATUS REPORT

## 2023-10-24 NOTE — Telephone Encounter (Signed)
 Called. No answer. Left message on the voicemail advising of Dr Irving Burton recommendations. Encouraged to call and schedule the appointment here.

## 2023-10-30 ENCOUNTER — Other Ambulatory Visit: Payer: Self-pay | Admitting: Nurse Practitioner

## 2023-10-30 DIAGNOSIS — J4489 Other specified chronic obstructive pulmonary disease: Secondary | ICD-10-CM

## 2023-11-22 DIAGNOSIS — E782 Mixed hyperlipidemia: Secondary | ICD-10-CM | POA: Diagnosis not present

## 2023-11-22 DIAGNOSIS — J449 Chronic obstructive pulmonary disease, unspecified: Secondary | ICD-10-CM | POA: Diagnosis not present

## 2023-11-22 DIAGNOSIS — R072 Precordial pain: Secondary | ICD-10-CM | POA: Diagnosis not present

## 2023-11-22 DIAGNOSIS — I4819 Other persistent atrial fibrillation: Secondary | ICD-10-CM | POA: Diagnosis not present

## 2023-11-22 DIAGNOSIS — Z7901 Long term (current) use of anticoagulants: Secondary | ICD-10-CM | POA: Diagnosis not present

## 2023-11-22 DIAGNOSIS — R0602 Shortness of breath: Secondary | ICD-10-CM | POA: Diagnosis not present

## 2023-11-22 DIAGNOSIS — Z79899 Other long term (current) drug therapy: Secondary | ICD-10-CM | POA: Diagnosis not present

## 2023-11-22 DIAGNOSIS — R9431 Abnormal electrocardiogram [ECG] [EKG]: Secondary | ICD-10-CM | POA: Diagnosis not present

## 2023-11-22 DIAGNOSIS — I10 Essential (primary) hypertension: Secondary | ICD-10-CM | POA: Diagnosis not present

## 2023-12-03 ENCOUNTER — Other Ambulatory Visit: Payer: Self-pay | Admitting: Nurse Practitioner

## 2023-12-03 DIAGNOSIS — J4489 Other specified chronic obstructive pulmonary disease: Secondary | ICD-10-CM

## 2024-01-20 ENCOUNTER — Encounter: Payer: Self-pay | Admitting: Nurse Practitioner

## 2024-01-20 ENCOUNTER — Ambulatory Visit (INDEPENDENT_AMBULATORY_CARE_PROVIDER_SITE_OTHER): Admitting: Nurse Practitioner

## 2024-01-20 VITALS — BP 157/89 | HR 56 | Temp 97.9°F | Ht 73.0 in | Wt 296.0 lb

## 2024-01-20 DIAGNOSIS — J4489 Other specified chronic obstructive pulmonary disease: Secondary | ICD-10-CM

## 2024-01-20 DIAGNOSIS — E785 Hyperlipidemia, unspecified: Secondary | ICD-10-CM

## 2024-01-20 DIAGNOSIS — E1169 Type 2 diabetes mellitus with other specified complication: Secondary | ICD-10-CM

## 2024-01-20 DIAGNOSIS — I10 Essential (primary) hypertension: Secondary | ICD-10-CM

## 2024-01-20 DIAGNOSIS — E039 Hypothyroidism, unspecified: Secondary | ICD-10-CM

## 2024-01-20 DIAGNOSIS — Z7984 Long term (current) use of oral hypoglycemic drugs: Secondary | ICD-10-CM

## 2024-01-20 DIAGNOSIS — E1142 Type 2 diabetes mellitus with diabetic polyneuropathy: Secondary | ICD-10-CM

## 2024-01-20 DIAGNOSIS — Z7985 Long-term (current) use of injectable non-insulin antidiabetic drugs: Secondary | ICD-10-CM

## 2024-01-20 LAB — LIPID PANEL

## 2024-01-20 LAB — BAYER DCA HB A1C WAIVED: HB A1C (BAYER DCA - WAIVED): 7.3 % — ABNORMAL HIGH (ref 4.8–5.6)

## 2024-01-20 MED ORDER — DILTIAZEM HCL ER 120 MG PO CP12: 120.0000 mg | ORAL_CAPSULE | Freq: Two times a day (BID) | ORAL | 5 refills | Status: DC

## 2024-01-20 MED ORDER — APIXABAN 5 MG PO TABS: 5.0000 mg | ORAL_TABLET | Freq: Two times a day (BID) | ORAL | 5 refills | Status: DC

## 2024-01-20 MED ORDER — TRELEGY ELLIPTA 100-62.5-25 MCG/ACT IN AEPB: 1.0000 | INHALATION_SPRAY | Freq: Every day | RESPIRATORY_TRACT | 11 refills | Status: AC

## 2024-01-20 MED ORDER — FENOFIBRATE 160 MG PO TABS: 160.0000 mg | ORAL_TABLET | Freq: Every day | ORAL | 1 refills | Status: DC

## 2024-01-20 MED ORDER — GLIPIZIDE 5 MG PO TABS: 5.0000 mg | ORAL_TABLET | Freq: Two times a day (BID) | ORAL | 1 refills | Status: DC

## 2024-01-20 MED ORDER — ATORVASTATIN CALCIUM 40 MG PO TABS: 40.0000 mg | ORAL_TABLET | Freq: Every day | ORAL | 1 refills | Status: DC

## 2024-01-20 MED ORDER — METFORMIN HCL 1000 MG PO TABS: ORAL_TABLET | ORAL | 1 refills | Status: DC

## 2024-01-20 MED ORDER — LISINOPRIL-HYDROCHLOROTHIAZIDE 20-25 MG PO TABS: 1.0000 | ORAL_TABLET | Freq: Every day | ORAL | 1 refills | Status: DC

## 2024-01-20 NOTE — Progress Notes (Signed)
 Subjective:    Patient ID: Kurt Blevins, male    DOB: 1960-11-16, 63 y.o.   MRN: [id number]   Chief Complaint: medical management of chronic issues     HPI:  Kurt Blevins is a 63 y.o. who identifies as a male who was assigned male at birth.   Social history: Lives with: wife Work history: disability   Comes in today for follow up of the following chronic medical issues:  1. Primary hypertension No c/o chest pain, sob or headache. Does check blood pressure at home. Usually runs in the 120 systolic. BP Readings from Last 3 Encounters:  10/22/23 (!) 120/50  10/10/23 123/60  07/26/23 138/84     2. Persistent atrial fib Denies heart racing and or palpitations  3. Hyperlipidemia associated with type 2 diabetes mellitus (HCC) Does not watch diet very closely and does no dedicated exercise. Lab Results  Component Value Date   CHOL 130 10/22/2023   HDL 34 (L) 10/22/2023   LDLCALC 68 10/22/2023   TRIG 161 (H) 10/22/2023   CHOLHDL 3.8 10/22/2023   The 10-year ASCVD risk score (Arnett DK, et al., 2019) is: 27%   4. Type 2 diabetes mellitus with diabetic polyneuropathy, without long-term current use of insulin (HCC) Fasting blood sugars are running around 130-150. No low blood sugars Lab Results  Component Value Date   HGBA1C 6.9 (H) 10/22/2023     5. Neuropathy due to type 2 diabetes mellitus (HCC) Feet stay numb with occasional burning  6. COPD with asthma (HCC) No cough  7. Severe obesity (BMI >= 40) (HCC) Weight is up 5 lbs   Wt Readings from Last 3 Encounters:  01/20/24 296 lb (134.3 kg)  10/22/23 295 lb (133.8 kg)  10/10/23 290 lb (131.5 kg)   BMI Readings from Last 3 Encounters:  01/20/24 39.05 kg/m  10/22/23 38.92 kg/m  10/10/23 38.26 kg/m        New complaints: None today  Allergies  Allergen Reactions   Penicillins Anaphylaxis and Other (See Comments)    Whelps Has patient had a PCN reaction causing immediate rash,  facial/tongue/throat swelling, SOB or lightheadedness with hypotension: Yes Has patient had a PCN reaction causing severe rash involving mucus membranes or skin necrosis: Yes Has patient had a PCN reaction that required hospitalization: Yes Has patient had a PCN reaction occurring within the last 10 years: No If all of the above answers are NO, then may proceed with Cephalosporin use.    Egg-Derived Products Nausea Only and Other (See Comments)    Stomach cramps   Metoprolol      Severe headache, caused shoulder, neck and arm pain   Peanut-Containing Drug Products Hives and Itching   Outpatient Encounter Medications as of 01/20/2024  Medication Sig   apixaban  (ELIQUIS ) 5 MG TABS tablet Take 1 tablet (5 mg total) by mouth 2 (two) times daily.   atorvastatin  (LIPITOR) 40 MG tablet Take 1 tablet (40 mg total) by mouth daily.   diltiazem  (CARDIZEM  SR) 120 MG 12 hr capsule Take 1 capsule (120 mg total) by mouth 2 (two) times daily.   fenofibrate  160 MG tablet Take 1 tablet (160 mg total) by mouth daily.   Fluticasone -Umeclidin-Vilant (TRELEGY ELLIPTA ) 100-62.5-25 MCG/ACT AEPB Inhale 1 puff into the lungs daily.   glipiZIDE  (GLUCOTROL ) 5 MG tablet Take 1 tablet (5 mg total) by mouth 2 (two) times daily before a meal.   glucose blood (ONETOUCH VERIO) test strip Check BS one daily Dx Z88.57  ipratropium-albuterol  (DUONEB) 0.5-2.5 (3) MG/3ML SOLN USE 1 VIAL VIA NEBULIZER 3 TIMES DAILY AS NEEDED FOR SHORTNESS OF BREATH   lisinopril -hydrochlorothiazide  (ZESTORETIC ) 20-25 MG tablet Take 1 tablet by mouth daily.   metFORMIN  (GLUCOPHAGE ) 1000 MG tablet TAKE ONE TABLET TWICE DAILY WITH A MEAL.   OneTouch Delica Lancets 33G MISC Check blood sugar 1x per day and prn  E 11.9   No facility-administered encounter medications on file as of 01/20/2024.    Past Surgical History:  Procedure Laterality Date   BACK SURGERY  1990s   CARDIOVERSION N/A 05/21/2023   Procedure: CARDIOVERSION;  Surgeon: Sheena Pugh, DO;  Location: [facility];  Service: Cardiovascular;  Laterality: N/A;   COLONOSCOPY N/A 02/25/2018   Procedure: COLONOSCOPY;  Surgeon: Legrand Victory LITTIE DOUGLAS, MD;  Location: [facility];  Service: Gastroenterology;  Laterality: N/A;   HERNIA REPAIR     NECK SURGERY     POLYPECTOMY  02/25/2018   Procedure: POLYPECTOMY;  Surgeon: Legrand Victory LITTIE DOUGLAS, MD;  Location: [facility];  Service: Gastroenterology;;   TONSILLECTOMY      Family History  Problem Relation Age of Onset   Diabetes Mother    Hypertension Mother    Hyperlipidemia Mother    Arthritis Mother    Osteoporosis Mother    Diabetes Father    Hypertension Father    Hyperlipidemia Father    Heart attack Father 43       Late 42s and 89s   Cancer Paternal Grandmother        colon   Healthy Sister    Healthy Brother    Healthy Daughter    Healthy Son    Healthy Son    Healthy Son       Controlled substance contract: n/a     Review of Systems  Constitutional:  Negative for diaphoresis.  Eyes:  Negative for pain.  Respiratory:  Negative for shortness of breath.   Cardiovascular:  Negative for chest pain, palpitations and leg swelling.  Gastrointestinal:  Negative for abdominal pain.  Endocrine: Negative for polydipsia.  Skin:  Negative for rash.  Neurological:  Negative for dizziness, weakness and headaches.  Hematological:  Does not bruise/bleed easily.  All other systems reviewed and are negative.      Objective:   Physical Exam Vitals and nursing note reviewed.  Constitutional:      Appearance: Normal appearance. He is well-developed.  HENT:     Head: Normocephalic.     Nose: Nose normal.     Mouth/Throat:     Mouth: Mucous membranes are moist.     Pharynx: Oropharynx is clear.   Eyes:     Pupils: Pupils are equal, round, and reactive to light.   Neck:     Thyroid : No thyroid  mass or thyromegaly.     Vascular: No carotid bruit or JVD.     Trachea: Phonation normal.    Cardiovascular:     Rate and Rhythm: Normal rate. Rhythm irregular.  Pulmonary:     Effort: Pulmonary effort is normal. No respiratory distress.     Breath sounds: Normal breath sounds.  Abdominal:     General: Bowel sounds are normal.     Palpations: Abdomen is soft.     Tenderness: There is no abdominal tenderness.   Musculoskeletal:        General: Normal range of motion.     Cervical back: Normal range of motion and neck supple.  Lymphadenopathy:     Cervical: No  cervical adenopathy.   Skin:    General: Skin is warm and dry.   Neurological:     Mental Status: He is alert and oriented to person, place, and time.   Psychiatric:        Behavior: Behavior normal.        Thought Content: Thought content normal.        Judgment: Judgment normal.    BP (!) 157/89   Pulse (!) 56   Temp 97.9 F (36.6 C) (Temporal)   Ht 6' 1 (1.854 m)   Wt 296 lb (134.3 kg)   SpO2 95%   BMI 39.05 kg/m     HGBA1c 7.3%       Assessment & Plan:   FREDRICH CORY comes in today with chief complaint of medical management of chronic issues    Diagnosis and orders addressed:  1. Primary hypertension (Primary) Low sodium diet - CBC with Differential/Platelet - CMP14+EGFR  2. Persistent atrial fibrillation (HCC) Avoid caffeine  3. Hyperlipidemia associated with type 2 diabetes mellitus (HCC) Low fat diet - Lipid panel  4. Type 2 diabetes mellitus with diabetic polyneuropathy, without long-term current use of insulin (HCC) Stricter carb counting - Bayer DCA Hb A1c Waived  5. Neuropathy due to type 2 diabetes mellitus (HCC) Report any issues  6. COPD with asthma (HCC) Continue neb treatments  7. Severe obesity (BMI >= 40) (HCC) Discussed diet and exercise for person with BMI >25 Will recheck weight in 3-6 months    Labs pending Health Maintenance reviewed Diet and exercise encouraged  Follow up plan: 6 months   Mary-Margaret Gladis, FNP

## 2024-01-20 NOTE — Patient Instructions (Signed)

## 2024-01-21 ENCOUNTER — Ambulatory Visit: Payer: Self-pay | Admitting: Nurse Practitioner

## 2024-01-21 LAB — CBC WITH DIFFERENTIAL/PLATELET
Basophils Absolute: 0 10*3/uL (ref 0.0–0.2)
Basos: 1 %
EOS (ABSOLUTE): 0.3 10*3/uL (ref 0.0–0.4)
Eos: 5 %
Hematocrit: 38.1 % (ref 37.5–51.0)
Hemoglobin: 13.1 g/dL (ref 13.0–17.7)
Immature Grans (Abs): 0 10*3/uL (ref 0.0–0.1)
Immature Granulocytes: 0 %
Lymphocytes Absolute: 2 10*3/uL (ref 0.7–3.1)
Lymphs: 29 %
MCH: 30.8 pg (ref 26.6–33.0)
MCHC: 34.4 g/dL (ref 31.5–35.7)
MCV: 89 fL (ref 79–97)
Monocytes Absolute: 0.5 10*3/uL (ref 0.1–0.9)
Monocytes: 8 %
Neutrophils Absolute: 3.9 10*3/uL (ref 1.4–7.0)
Neutrophils: 57 %
Platelets: 256 10*3/uL (ref 150–450)
RBC: 4.26 x10E6/uL (ref 4.14–5.80)
RDW: 13.1 % (ref 11.6–15.4)
WBC: 6.8 10*3/uL (ref 3.4–10.8)

## 2024-01-21 LAB — CMP14+EGFR
ALT: 16 IU/L (ref 0–44)
AST: 19 IU/L (ref 0–40)
Albumin: 4.2 g/dL (ref 3.9–4.9)
Alkaline Phosphatase: 57 IU/L (ref 44–121)
BUN/Creatinine Ratio: 14 (ref 10–24)
BUN: 16 mg/dL (ref 8–27)
Bilirubin Total: 0.2 mg/dL (ref 0.0–1.2)
CO2: 20 mmol/L (ref 20–29)
Calcium: 9.1 mg/dL (ref 8.6–10.2)
Chloride: 105 mmol/L (ref 96–106)
Creatinine, Ser: 1.17 mg/dL (ref 0.76–1.27)
Globulin, Total: 1.8 g/dL (ref 1.5–4.5)
Glucose: 95 mg/dL (ref 70–99)
Potassium: 4.4 mmol/L (ref 3.5–5.2)
Sodium: 140 mmol/L (ref 134–144)
Total Protein: 6 g/dL (ref 6.0–8.5)
eGFR: 70 mL/min/{1.73_m2} (ref >59)

## 2024-01-21 LAB — LIPID PANEL
Cholesterol, Total: 124 mg/dL (ref 100–199)
HDL: 37 mg/dL — AB (ref >39)
LDL CALC COMMENT:: 3.4 ratio (ref 0.0–5.0)
LDL Chol Calc (NIH): 60 mg/dL (ref 0–99)
Triglycerides: 155 mg/dL — AB (ref 0–149)
VLDL Cholesterol Cal: 27 mg/dL (ref 5–40)

## 2024-01-21 LAB — VITAMIN B12: Vitamin B-12: 319 pg/mL (ref 232–1245)

## 2024-01-22 LAB — THYROID PANEL WITH TSH
Free Thyroxine Index: 1.2 (ref 1.2–4.9)
T3 Uptake Ratio: 26 (ref 24–39)
T4, Total: 4.8 ug/dL (ref 4.5–12.0)
TSH: 5.91 u[IU]/mL — ABNORMAL HIGH (ref 0.450–4.500)

## 2024-01-22 LAB — SPECIMEN STATUS REPORT

## 2024-01-23 MED ORDER — LEVOTHYROXINE SODIUM 75 MCG PO TABS: 75.0000 ug | ORAL_TABLET | Freq: Every day | ORAL | 3 refills | Status: DC

## 2024-01-28 ENCOUNTER — Telehealth: Payer: Self-pay

## 2024-01-28 NOTE — Telephone Encounter (Signed)
 Copied from CRM [id number]. Topic: Clinical - Lab/Test Results >> Jan 28, 2024  3:40 PM Zebedee SAUNDERS wrote: Reason for CRM: Pt returning Kurt Alan MATSU, LPN call, provided message to pt. Pt stated he was contacted by pharmacy and started medication today. Pt had no further questions.

## 2024-01-30 ENCOUNTER — Other Ambulatory Visit: Payer: Self-pay | Admitting: Nurse Practitioner

## 2024-01-30 DIAGNOSIS — J4489 Other specified chronic obstructive pulmonary disease: Secondary | ICD-10-CM

## 2024-02-14 DIAGNOSIS — I350 Nonrheumatic aortic (valve) stenosis: Secondary | ICD-10-CM | POA: Diagnosis not present

## 2024-02-14 DIAGNOSIS — I4819 Other persistent atrial fibrillation: Secondary | ICD-10-CM | POA: Diagnosis not present

## 2024-02-14 DIAGNOSIS — I10 Essential (primary) hypertension: Secondary | ICD-10-CM | POA: Diagnosis not present

## 2024-02-14 DIAGNOSIS — Z79899 Other long term (current) drug therapy: Secondary | ICD-10-CM | POA: Diagnosis not present

## 2024-02-14 DIAGNOSIS — E782 Mixed hyperlipidemia: Secondary | ICD-10-CM | POA: Diagnosis not present

## 2024-02-14 DIAGNOSIS — Z7901 Long term (current) use of anticoagulants: Secondary | ICD-10-CM | POA: Diagnosis not present

## 2024-02-14 DIAGNOSIS — J449 Chronic obstructive pulmonary disease, unspecified: Secondary | ICD-10-CM | POA: Diagnosis not present

## 2024-02-24 DIAGNOSIS — E039 Hypothyroidism, unspecified: Secondary | ICD-10-CM | POA: Diagnosis not present

## 2024-02-24 DIAGNOSIS — Z79899 Other long term (current) drug therapy: Secondary | ICD-10-CM | POA: Diagnosis not present

## 2024-03-29 ENCOUNTER — Other Ambulatory Visit: Payer: Self-pay | Admitting: Nurse Practitioner

## 2024-03-29 DIAGNOSIS — J4489 Other specified chronic obstructive pulmonary disease: Secondary | ICD-10-CM

## 2024-03-30 ENCOUNTER — Other Ambulatory Visit: Payer: Self-pay | Admitting: Nurse Practitioner

## 2024-03-30 DIAGNOSIS — E1142 Type 2 diabetes mellitus with diabetic polyneuropathy: Secondary | ICD-10-CM

## 2024-03-30 DIAGNOSIS — E1169 Type 2 diabetes mellitus with other specified complication: Secondary | ICD-10-CM

## 2024-04-06 ENCOUNTER — Other Ambulatory Visit: Payer: Self-pay | Admitting: Nurse Practitioner

## 2024-04-06 DIAGNOSIS — I10 Essential (primary) hypertension: Secondary | ICD-10-CM

## 2024-05-12 NOTE — Progress Notes (Signed)
 Kurt Blevins                                          MRN: [id number]   05/12/2024   The [facility] Quality Team Specialist reviewed this patient medical record for the purposes of chart review for care gap closure. The following were reviewed: abstraction for care gap closure-glycemic status assessment.    [facility] Quality Team

## 2024-05-25 NOTE — Progress Notes (Signed)
 Kurt Blevins                                          MRN: [id number]   05/25/2024   The [facility] Quality Team Specialist reviewed this patient medical record for the purposes of chart review for care gap closure. The following were reviewed: chart review for care gap closure-kidney health evaluation for diabetes:eGFR  and uACR.    [facility] Quality Team

## 2024-06-01 ENCOUNTER — Other Ambulatory Visit: Payer: Self-pay

## 2024-06-01 DIAGNOSIS — E1142 Type 2 diabetes mellitus with diabetic polyneuropathy: Secondary | ICD-10-CM

## 2024-06-01 NOTE — Progress Notes (Signed)
 Pharmacy Quality Measure Review  This patient is appearing on a report for being at risk of failing the adherence measure for diabetes medications this calendar year.   Medication:  Metformin  1,000 mg twice daily LF 01/10/24 for 90 day supply Glipizide  5 mg twice daily LF 01/10/24 for 90 day supply   Spoke with Kurt Blevins and his wife, Kurt Blevins, over the phone in regard to medication management. They state that he has a surplus of medications from previous fills that he is trying to use up before refilling a new prescription. He states that he has been taking his medication as prescribed and denies any recent missed doses, but does endorse missed doses within the last 1-2 months. His wife helps him manage his medications; she is the preferred contact and DPR.   He reports concern for his blood sugar recently, with readings as low as 35 mg/dL. When his blood sugar drops, he states that it looks like he is looking through a straw and that he becomes extremely lightheaded. This is a new occurrence since he has started making lifestyle changes, reducing carbohydrate intake and increasing exercise. It is recommended for him to reduce his dose of glipizide  to once daily and only with his biggest meal of the day, skipping the dose if he doesn't eat much during the day. Encouraged to keep a log book of readings throughout this next week. Collaborating with the embedded pharmacist and PCP for referral to the pharmacy for medication management. Scheduled a follow-up with him next Tuesday, 11/18. Potential to completely stop the glipizide  if lifestyle changes continue.   Verified fill records with [facility], patient has not picked up a prescription from them since June, but they do have new prescriptions for both metformin  and glipizide . Will need to verify the exact amount of medication on hand during follow-up.   Kurt Blevins, PharmD PGY1 Pharmacy Resident  06/01/2024

## 2024-06-08 NOTE — Progress Notes (Unsigned)
 06/09/2024 Name: Kurt Blevins MRN: [id number] DOB: 05-12-1961  Chief Complaint  Patient presents with   Diabetes    Kurt Blevins is a 63 y.o. year old male who presented for a telephone visit. They were referred to the pharmacist by a quality report for assistance in managing diabetes and medication access.    Subjective: Spoke with Kurt Blevins, Kurt Blevins and DPR, over the phone in regards to diabetes management. She states that his sugars have been doing better since reducing the glipizide ; however, they are still running in the low 70s before going to bed. Denies any recent symptoms of low blood sugar (e.g., shakiness, sweating).  She is concerned about Kurt Blevins displaying signs and symptoms of dementia since he has been having difficulty remembering and recalling information. Encouraged her to discuss with Kurt Blevins in January at his next visit.    Care Team: Primary Care Provider: Gladis Mustard, FNP ; Next Scheduled Visit: 07/27/2024  Medication Access/Adherence Current Pharmacy:  [facility] - [place], [place] - [address] [place] [address] Phone: [phone] Fax: [phone]  [facility] [address] [place], [place] [address] [place] [address] Phone: [phone] Fax: [phone]   Patient reports affordability concerns with their medications: No  Patient reports access/transportation concerns to their pharmacy: No  Patient reports adherence concerns with their medications:  Yes  - forgetfulness, Blevins assists with medication management  Diabetes: Current medications: metformin  1,000 mg BID Past medication: glipizide  (hypoglycemia)  Statin: atorvastatin  40 mg daily LDL of 60 on 01/19/34 ACEi/ARB: lisinopril -hydrochlorothiazide  20-25 mg daily UACR of <6 on 04/23/23, due for updated lab  A1c of 7.3% on 01/20/24, increased from 6.9% on 10/22/23  Current glucose readings:  Using One Touch meter;  testing 2 times daily Tuesday- AM: 129 mg/dL, PM: 99 mg/dL Wednesday- AM: 879 mg/dL, PM: 78 mg/dL  Forgot to write down blood sugars for the rest of the week, but reports similar daily trends.   Patient denies hypoglycemic s/sx including dizziness, shakiness, sweating. Patient denies hyperglycemic symptoms including polyuria, polydipsia, polyphagia, nocturia, neuropathy, blurred vision.  Current meal patterns: No sodas, only water and zero-sugar lemonade, Gatorade zero Discussed meal planning options and Plate method for healthy eating Avoid sugary drinks and desserts Incorporate balanced protein, non starchy veggies, 1 serving of carbohydrate with each meal Increase water intake Increase physical activity as able   Current medication access support: [employer]   Objective: Lab Results  Component Value Date   HGBA1C 7.3 (H) 01/20/2024   Lab Results  Component Value Date   CREATININE 1.17 01/20/2024   BUN 16 01/20/2024   NA 140 01/20/2024   K 4.4 01/20/2024   CL 105 01/20/2024   CO2 20 01/20/2024   Lab Results  Component Value Date   CHOL 124 01/20/2024   HDL 37 (L) 01/20/2024   LDLCALC 60 01/20/2024   TRIG 155 (H) 01/20/2024   CHOLHDL 3.4 01/20/2024   Medications Reviewed Today     Reviewed by Kurt Blevins, [facility] (Pharmacist) on 06/09/24 at 1010  Med List Status: <None>   Medication Order Taking? Sig Documenting Provider Last Dose Status Informant  apixaban  (ELIQUIS ) 5 MG TABS tablet [id number] Yes Take 1 tablet (5 mg total) by mouth 2 (two) times daily. Kurt, Mary-Margaret, FNP  Active   atorvastatin  (LIPITOR) 40 MG tablet [id number] Yes TAKE ONE TABLET BY MOUTH EVERY DAY Kurt Mustard, FNP  Active  Discontinued 06/09/24 1005 (Completed Course)   fenofibrate  160 MG tablet [id number] Yes TAKE ONE TABLET BY MOUTH DAILY Kurt, Mary-Margaret, FNP  Active   Fluticasone -Umeclidin-Vilant (TRELEGY ELLIPTA ) 100-62.5-25 MCG/ACT AEPB [id number] Yes Inhale 1 puff into the  lungs daily. Kurt Mustard, FNP  Active     Discontinued 06/09/24 1008 (Change in therapy)   glucose blood (ONETOUCH VERIO) test strip [id number]  Check BS one daily Dx E11.42 Kurt Mustard, FNP  Active Spouse/Significant Other  ipratropium-albuterol  (DUONEB) 0.5-2.5 (3) MG/3ML SOLN [id number]  USE 1 VIAL VIA NEBULIZER 3 TIMES DAILY AS NEEDED FOR SHORTNESS OF Kurt Blevins Kurt, Mary-Margaret, FNP  Active   levothyroxine  (SYNTHROID ) 75 MCG tablet [id number] Yes Take 1 tablet (75 mcg total) by mouth daily. Kurt, Mary-Margaret, FNP  Active   lisinopril -hydrochlorothiazide  (ZESTORETIC ) 20-25 MG tablet [id number] Yes TAKE ONE TABLET BY MOUTH DAILY Kurt, Mary-Margaret, FNP  Active   metFORMIN  (GLUCOPHAGE ) 1000 MG tablet [id number] Yes TAKE ONE TABLET BY MOUTH TWO TIMES DAILY. WITH A MEAL Kurt Mustard, FNP  Active   OneTouch Delica Lancets 33G [place] [id number]  Check blood sugar 1x per day and prn  E 11.9 Kurt, Mary-Margaret, FNP  Active Spouse/Significant Other           Assessment/Plan:  Diabetes: Currently uncontrolled. Goal of <7%. Cardiorenal risk reduction is opportunities for improvement. Future consideration for GLPRA and SGLT2i to optimize protection. Concerns with cost barrier, pill burden, and forgetfulness.  Blood pressure is not at goal of <130/80 mmHg Spoke with Kurt Blevins, his Blevins, unable to assess home blood pressure readings at this time.  LDL is at goal of <100 mg/dL Reviewed long term cardiovascular and renal outcomes of uncontrolled blood sugar Reviewed goal A1c, goal fasting, and goal 2 hour post prandial glucose Recommend to:  STOP glipizide  CONTINUE metformin  1,000 mg BID Counseled on taking metformin  twice daily. Reports often forgetting evening dose, which leads to a back stock of medication.  Recommend to check glucose daily with glucometer Encouraged Kurt Blevins to call if patient continues to have low blood sugar readings or symptoms of hypoglycemia prior to  follow up with Kurt Blevins.  Orders placed for UACR, A1c, lipid panel, CMP, and CBC with diff for upcoming visit  Follow Up Plan:  PCP: 07/27/2024  Kurt Blevins, PharmD PGY1 Pharmacy Resident  06/09/2024  Kurt Blevins, PharmD, BCACP, CPP Clinical Pharmacist, [facility]

## 2024-06-09 ENCOUNTER — Other Ambulatory Visit (INDEPENDENT_AMBULATORY_CARE_PROVIDER_SITE_OTHER)

## 2024-06-09 DIAGNOSIS — E785 Hyperlipidemia, unspecified: Secondary | ICD-10-CM

## 2024-06-09 DIAGNOSIS — E1169 Type 2 diabetes mellitus with other specified complication: Secondary | ICD-10-CM

## 2024-06-09 DIAGNOSIS — E1142 Type 2 diabetes mellitus with diabetic polyneuropathy: Secondary | ICD-10-CM

## 2024-07-27 ENCOUNTER — Encounter: Payer: Self-pay | Admitting: Nurse Practitioner

## 2024-07-27 ENCOUNTER — Ambulatory Visit: Payer: Self-pay | Admitting: Nurse Practitioner

## 2024-07-27 VITALS — BP 128/84 | HR 65 | Temp 97.6°F | Ht 73.0 in | Wt 280.0 lb

## 2024-07-27 DIAGNOSIS — Z7984 Long term (current) use of oral hypoglycemic drugs: Secondary | ICD-10-CM

## 2024-07-27 DIAGNOSIS — G5603 Carpal tunnel syndrome, bilateral upper limbs: Secondary | ICD-10-CM

## 2024-07-27 DIAGNOSIS — J4489 Other specified chronic obstructive pulmonary disease: Secondary | ICD-10-CM

## 2024-07-27 DIAGNOSIS — E1142 Type 2 diabetes mellitus with diabetic polyneuropathy: Secondary | ICD-10-CM

## 2024-07-27 DIAGNOSIS — E1169 Type 2 diabetes mellitus with other specified complication: Secondary | ICD-10-CM

## 2024-07-27 DIAGNOSIS — Z6836 Body mass index (BMI) 36.0-36.9, adult: Secondary | ICD-10-CM | POA: Diagnosis not present

## 2024-07-27 DIAGNOSIS — I10 Essential (primary) hypertension: Secondary | ICD-10-CM | POA: Diagnosis not present

## 2024-07-27 DIAGNOSIS — E1140 Type 2 diabetes mellitus with diabetic neuropathy, unspecified: Secondary | ICD-10-CM | POA: Diagnosis not present

## 2024-07-27 DIAGNOSIS — I4819 Other persistent atrial fibrillation: Secondary | ICD-10-CM

## 2024-07-27 DIAGNOSIS — E785 Hyperlipidemia, unspecified: Secondary | ICD-10-CM

## 2024-07-27 LAB — CBC WITH DIFFERENTIAL/PLATELET
Basophils Absolute: 0.1 x10E3/uL (ref 0.0–0.2)
Basos: 1 %
EOS (ABSOLUTE): 0.4 x10E3/uL (ref 0.0–0.4)
Eos: 6 %
Hematocrit: 41.4 % (ref 37.5–51.0)
Hemoglobin: 13.8 g/dL (ref 13.0–17.7)
Immature Grans (Abs): 0 x10E3/uL (ref 0.0–0.1)
Immature Granulocytes: 0 %
Lymphocytes Absolute: 2.3 x10E3/uL (ref 0.7–3.1)
Lymphs: 31 %
MCH: 29.2 pg (ref 26.6–33.0)
MCHC: 33.3 g/dL (ref 31.5–35.7)
MCV: 88 fL (ref 79–97)
Monocytes Absolute: 0.6 x10E3/uL (ref 0.1–0.9)
Monocytes: 8 %
Neutrophils Absolute: 4 x10E3/uL (ref 1.4–7.0)
Neutrophils: 54 %
Platelets: 291 x10E3/uL (ref 150–450)
RBC: 4.72 x10E6/uL (ref 4.14–5.80)
RDW: 12.2 % (ref 11.6–15.4)
WBC: 7.4 x10E3/uL (ref 3.4–10.8)

## 2024-07-27 LAB — CMP14+EGFR
ALT: 16 IU/L (ref 0–44)
AST: 13 IU/L (ref 0–40)
Albumin: 4.1 g/dL (ref 3.9–4.9)
Alkaline Phosphatase: 65 IU/L (ref 47–123)
BUN/Creatinine Ratio: 15 (ref 10–24)
BUN: 17 mg/dL (ref 8–27)
Bilirubin Total: 0.2 mg/dL (ref 0.0–1.2)
CO2: 22 mmol/L (ref 20–29)
Calcium: 9.7 mg/dL (ref 8.6–10.2)
Chloride: 102 mmol/L (ref 96–106)
Creatinine, Ser: 1.13 mg/dL (ref 0.76–1.27)
Globulin, Total: 1.8 g/dL (ref 1.5–4.5)
Glucose: 176 mg/dL — ABNORMAL HIGH (ref 70–99)
Potassium: 4.3 mmol/L (ref 3.5–5.2)
Sodium: 139 mmol/L (ref 134–144)
Total Protein: 5.9 g/dL — ABNORMAL LOW (ref 6.0–8.5)
eGFR: 73 mL/min/1.73

## 2024-07-27 LAB — LIPID PANEL
Chol/HDL Ratio: 4 ratio (ref 0.0–5.0)
Cholesterol, Total: 127 mg/dL (ref 100–199)
HDL: 32 mg/dL — ABNORMAL LOW
LDL Chol Calc (NIH): 67 mg/dL (ref 0–99)
Triglycerides: 162 mg/dL — ABNORMAL HIGH (ref 0–149)
VLDL Cholesterol Cal: 28 mg/dL (ref 5–40)

## 2024-07-27 LAB — BAYER DCA HB A1C WAIVED: HB A1C (BAYER DCA - WAIVED): 7.6 % — ABNORMAL HIGH (ref 4.8–5.6)

## 2024-07-27 MED ORDER — ATORVASTATIN CALCIUM 40 MG PO TABS: 40.0000 mg | ORAL_TABLET | Freq: Every day | ORAL | 1 refills | Status: AC

## 2024-07-27 MED ORDER — METFORMIN HCL 1000 MG PO TABS: ORAL_TABLET | ORAL | 1 refills | Status: AC

## 2024-07-27 MED ORDER — LISINOPRIL-HYDROCHLOROTHIAZIDE 20-25 MG PO TABS: 1.0000 | ORAL_TABLET | Freq: Every day | ORAL | 1 refills | Status: AC

## 2024-07-27 MED ORDER — FENOFIBRATE 160 MG PO TABS: 160.0000 mg | ORAL_TABLET | Freq: Every day | ORAL | 1 refills | Status: AC

## 2024-07-27 MED ORDER — GLIPIZIDE 5 MG PO TABS: 5.0000 mg | ORAL_TABLET | Freq: Every day | ORAL | 1 refills | Status: AC

## 2024-07-27 MED ORDER — APIXABAN 5 MG PO TABS: 5.0000 mg | ORAL_TABLET | Freq: Two times a day (BID) | ORAL | 5 refills | Status: AC

## 2024-07-27 MED ORDER — LEVOTHYROXINE SODIUM 75 MCG PO TABS: 75.0000 ug | ORAL_TABLET | Freq: Every day | ORAL | 3 refills | Status: AC

## 2024-07-27 NOTE — Patient Instructions (Signed)

## 2024-07-27 NOTE — Progress Notes (Signed)
 "  Subjective:    Patient ID: Kurt Blevins, male    DOB: 04/27/1961, 64 y.o.   MRN: [id number]   Chief Complaint: medical management of chronic issues     HPI:  Kurt Blevins is a 64 y.o. who identifies as a male who was assigned male at birth.   Social history: Lives with: wife Work history: disability   Comes in today for follow up of the following chronic medical issues:  1. Primary hypertension No c/o chest pain, sob or headache. Does check blood pressure at home. Usually runs in the 120 systolic. BP Readings from Last 3 Encounters:  01/20/24 (!) 157/89  10/22/23 (!) 120/50  10/10/23 123/60     2. Persistent atrial fib Denies heart racing and or palpitations. Had ablation back in february.  3. Hyperlipidemia associated with type 2 diabetes mellitus (HCC) Does not watch diet very closely and does no dedicated exercise. Lab Results  Component Value Date   CHOL 124 01/20/2024   HDL 37 (L) 01/20/2024   LDLCALC 60 01/20/2024   TRIG 155 (H) 01/20/2024   CHOLHDL 3.4 01/20/2024   The ASCVD Risk score (Arnett DK, et al., 2019) failed to calculate for the following reasons:   The valid total cholesterol range is 130 to 320 mg/dL   4. Type 2 diabetes mellitus with diabetic polyneuropathy, without long-term current use of insulin [facility]) Patient had a call from clinical pharmacy and he was telling them that he would take his meds in morning. Would not eat lunch and then blood sugar would drop to 30's. She told him  to stop glipizide , rather than telling him  to eat lunch. Now blood sugars are running over 200 frequently. Fasting blood sugars are running around 130-150. No low blood sugars Lab Results  Component Value Date   HGBA1C 7.3 (H) 01/20/2024     5. Neuropathy due to type 2 diabetes mellitus (HCC) Feet stay numb with occasional burning  6. COPD with asthma (HCC) No cough  7. Severe obesity (BMI >= 40) (HCC) Weight is down 16lbs.  Wt Readings from Last 3  Encounters:  07/27/24 280 lb (127 kg)  01/20/24 296 lb (134.3 kg)  10/22/23 295 lb (133.8 kg)   BMI Readings from Last 3 Encounters:  07/27/24 36.94 kg/m  01/20/24 39.05 kg/m  10/22/23 38.92 kg/m     New complaints: Bil hand numbness- mainly thumb, index and ring finger  Allergies  Allergen Reactions   Penicillins Anaphylaxis and Other (See Comments)    [place]ps Has patient had a PCN reaction causing immediate rash, facial/tongue/throat swelling, SOB or lightheadedness with hypotension: Yes Has patient had a PCN reaction causing severe rash involving mucus membranes or skin necrosis: Yes Has patient had a PCN reaction that required hospitalization: Yes Has patient had a PCN reaction occurring within the last 10 years: No If all of the above answers are NO, then may proceed with Cephalosporin use.    Egg Protein-Containing Drug Products Nausea Only and Other (See Comments)    Stomach cramps   Metoprolol      Severe headache, caused shoulder, neck and arm pain   Peanut-Containing Drug Products Hives and Itching   Outpatient Encounter Medications as of 07/27/2024  Medication Sig   apixaban  (ELIQUIS ) 5 MG TABS tablet Take 1 tablet (5 mg total) by mouth 2 (two) times daily.   atorvastatin  (LIPITOR) 40 MG tablet TAKE ONE TABLET BY MOUTH EVERY DAY   fenofibrate  160 MG tablet TAKE ONE TABLET  BY MOUTH DAILY   Fluticasone -Umeclidin-Vilant (TRELEGY ELLIPTA ) 100-62.5-25 MCG/ACT AEPB Inhale 1 puff into the lungs daily.   glucose blood (ONETOUCH VERIO) test strip Check BS one daily Dx E11.42   ipratropium-albuterol  (DUONEB) 0.5-2.5 (3) MG/3ML SOLN USE 1 VIAL VIA NEBULIZER 3 TIMES DAILY AS NEEDED FOR SHORTNESS OF BREATH   levothyroxine  (SYNTHROID ) 75 MCG tablet Take 1 tablet (75 mcg total) by mouth daily.   lisinopril -hydrochlorothiazide  (ZESTORETIC ) 20-25 MG tablet TAKE ONE TABLET BY MOUTH DAILY   metFORMIN  (GLUCOPHAGE ) 1000 MG tablet TAKE ONE TABLET BY MOUTH TWO TIMES DAILY. WITH A MEAL    OneTouch Delica Lancets 33G MISC Check blood sugar 1x per day and prn  E 11.9   No facility-administered encounter medications on file as of 07/27/2024.    Past Surgical History:  Procedure Laterality Date   BACK SURGERY  1990s   CARDIOVERSION N/A 05/21/2023   Procedure: CARDIOVERSION;  Surgeon: Sheena Pugh, DO;  Location: [facility];  Service: Cardiovascular;  Laterality: N/A;   COLONOSCOPY N/A 02/25/2018   Procedure: COLONOSCOPY;  Surgeon: Legrand Victory LITTIE DOUGLAS, MD;  Location: [facility]OSCOPY;  Service: Gastroenterology;  Laterality: N/A;   HERNIA REPAIR     NECK SURGERY     POLYPECTOMY  02/25/2018   Procedure: POLYPECTOMY;  Surgeon: Legrand Victory LITTIE DOUGLAS, MD;  Location: [facility]OSCOPY;  Service: Gastroenterology;;   TONSILLECTOMY      Family History  Problem Relation Age of Onset   Diabetes Mother    Hypertension Mother    Hyperlipidemia Mother    Arthritis Mother    Osteoporosis Mother    Diabetes Father    Hypertension Father    Hyperlipidemia Father    Heart attack Father 58       Late 6s and 85s   Cancer Paternal Grandmother        colon   Healthy Sister    Healthy Brother    Healthy Daughter    Healthy Son    Healthy Son    Healthy Son       Controlled substance contract: n/a     Review of Systems  Constitutional:  Negative for diaphoresis.  Eyes:  Negative for pain.  Respiratory:  Negative for shortness of breath.   Cardiovascular:  Negative for chest pain, palpitations and leg swelling.  Gastrointestinal:  Negative for abdominal pain.  Endocrine: Negative for polydipsia.  Skin:  Negative for rash.  Neurological:  Negative for dizziness, weakness and headaches.  Hematological:  Does not bruise/bleed easily.  All other systems reviewed and are negative.      Objective:   Physical Exam Vitals and nursing note reviewed.  Constitutional:      Appearance: Normal appearance. He is well-developed.  HENT:     Head: Normocephalic.     Nose: Nose  normal.     Mouth/Throat:     Mouth: Mucous membranes are moist.     Pharynx: Oropharynx is clear.  Eyes:     Pupils: Pupils are equal, round, and reactive to light.  Neck:     Thyroid : No thyroid  mass or thyromegaly.     Vascular: No carotid bruit or JVD.     Trachea: Phonation normal.  Cardiovascular:     Rate and Rhythm: Normal rate. Rhythm irregular.  Pulmonary:     Effort: Pulmonary effort is normal. No respiratory distress.     Breath sounds: Normal breath sounds.  Abdominal:     General: Bowel sounds are normal.  Palpations: Abdomen is soft.     Tenderness: There is no abdominal tenderness.  Musculoskeletal:        General: Normal range of motion.     Cervical back: Normal range of motion and neck supple.  Lymphadenopathy:     Cervical: No cervical adenopathy.  Skin:    General: Skin is warm and dry.  Neurological:     Mental Status: He is alert and oriented to person, place, and time.  Psychiatric:        Behavior: Behavior normal.        Thought Content: Thought content normal.        Judgment: Judgment normal.      BP 128/84 Comment: at home  Pulse 65   Temp 97.6 F (36.4 C) (Temporal)   Ht 6' 1 (1.854 m)   Wt 280 lb (127 kg)   SpO2 97%   BMI 36.94 kg/m      HGBA1c 7.6%       Assessment & Plan:  KEEFER SOULLIERE comes in today with chief complaint of Medical Management of Chronic Issues   Diagnosis and orders addressed:  1. Primary hypertension (Primary) Dash diet - CBC with Differential/Platelet - CMP14+EGFR - apixaban  (ELIQUIS ) 5 MG TABS tablet; Take 1 tablet (5 mg total) by mouth 2 (two) times daily.  Dispense: 60 tablet; Refill: 5 - lisinopril -hydrochlorothiazide  (ZESTORETIC ) 20-25 MG tablet; Take 1 tablet by mouth daily.  Dispense: 90 tablet; Refill: 1  2. Persistent atrial fibrillation (HCC) Report any palpitations  3. Hyperlipidemia associated with type 2 diabetes mellitus (HCC) Low fat diet - Lipid panel - atorvastatin   (LIPITOR) 40 MG tablet; Take 1 tablet (40 mg total) by mouth daily.  Dispense: 90 tablet; Refill: 1 - fenofibrate  160 MG tablet; Take 1 tablet (160 mg total) by mouth daily.  Dispense: 90 tablet; Refill: 1  4. Type 2 diabetes mellitus with diabetic polyneuropathy, without long-term current use of insulin (HCC) Added glipizide  back Eat 3 meals a day - Bayer DCA Hb A1c Waived - Microalbumin / creatinine urine ratio - metFORMIN  (GLUCOPHAGE ) 1000 MG tablet; TAKE ONE TABLET BY MOUTH TWO TIMES DAILY. WITH A MEAL  Dispense: 180 tablet; Refill: 1 - glipiZIDE  (GLUCOTROL ) 5 MG tablet; Take 1 tablet (5 mg total) by mouth daily before breakfast.  Dispense: 90 tablet; Refill: 1  5. Neuropathy due to type 2 diabetes mellitus (HCC) Do not go barefooted  6. COPD with asthma (HCC) Keep follow up with cardiology  7. Severe obesity (BMI >= 40) (HCC) Discussed diet and exercise for person with BMI >25 Will recheck weight in 3-6 months   8. Bilateral carpal tunnel syndrome Referral to ortho - Ambulatory referral to Orthopedic Surgery   Labs pending Health Maintenance reviewed Diet and exercise encouraged  Follow up plan: 3 months   Mary-Margaret Gladis, FNP  "

## 2024-07-28 ENCOUNTER — Ambulatory Visit: Payer: Self-pay | Admitting: Nurse Practitioner

## 2024-07-28 LAB — MICROALBUMIN / CREATININE URINE RATIO
Creatinine, Urine: 103.5 mg/dL
Microalb/Creat Ratio: <3 mg/g{creat} (ref 0–29)
Microalbumin, Urine: <3 ug/mL

## 2024-10-26 ENCOUNTER — Ambulatory Visit: Admitting: Nurse Practitioner
